# Patient Record
Sex: Male | Born: 1970 | Race: White | Hispanic: No | Marital: Married | Smoking: Never smoker
Health system: Southern US, Community
[De-identification: ages and names within clinical notes are randomized; demographics above are authoritative.]

## PROBLEM LIST (undated history)

## (undated) DIAGNOSIS — R0683 Snoring: Secondary | ICD-10-CM

## (undated) DIAGNOSIS — I517 Cardiomegaly: Secondary | ICD-10-CM

## (undated) DIAGNOSIS — T82198A Other mechanical complication of other cardiac electronic device, initial encounter: Secondary | ICD-10-CM

## (undated) DIAGNOSIS — I4819 Other persistent atrial fibrillation: Secondary | ICD-10-CM

## (undated) DIAGNOSIS — R011 Cardiac murmur, unspecified: Secondary | ICD-10-CM

## (undated) DIAGNOSIS — Z95810 Presence of automatic (implantable) cardiac defibrillator: Secondary | ICD-10-CM

## (undated) HISTORY — DX: Snoring: R06.83

## (undated) HISTORY — DX: Presence of automatic (implantable) cardiac defibrillator: Z95.810

## (undated) HISTORY — DX: Other persistent atrial fibrillation: I48.19

## (undated) HISTORY — DX: Cardiomegaly: I51.7

## (undated) HISTORY — PX: OTHER SURGICAL HISTORY: SHX169

## (undated) HISTORY — DX: Other mechanical complication of other cardiac electronic device, initial encounter: T82.198A

## (undated) HISTORY — DX: Cardiac murmur, unspecified: R01.1

---

## 2010-05-17 ENCOUNTER — Encounter: Payer: Self-pay | Admitting: Internal Medicine

## 2010-05-18 ENCOUNTER — Observation Stay (HOSPITAL_COMMUNITY): Admission: EM | Admit: 2010-05-18 | Discharge: 2010-05-19 | Payer: Self-pay | Admitting: Emergency Medicine

## 2010-05-18 ENCOUNTER — Ambulatory Visit: Payer: Self-pay | Admitting: Cardiology

## 2010-05-18 ENCOUNTER — Encounter (INDEPENDENT_AMBULATORY_CARE_PROVIDER_SITE_OTHER): Payer: Self-pay | Admitting: Internal Medicine

## 2010-05-19 ENCOUNTER — Encounter: Payer: Self-pay | Admitting: Cardiovascular Disease

## 2010-05-19 ENCOUNTER — Encounter (INDEPENDENT_AMBULATORY_CARE_PROVIDER_SITE_OTHER): Payer: Self-pay | Admitting: Internal Medicine

## 2010-05-19 DIAGNOSIS — R011 Cardiac murmur, unspecified: Secondary | ICD-10-CM | POA: Insufficient documentation

## 2010-05-19 DIAGNOSIS — D649 Anemia, unspecified: Secondary | ICD-10-CM

## 2010-05-20 DIAGNOSIS — E669 Obesity, unspecified: Secondary | ICD-10-CM | POA: Insufficient documentation

## 2010-05-20 DIAGNOSIS — I422 Other hypertrophic cardiomyopathy: Secondary | ICD-10-CM

## 2010-05-21 ENCOUNTER — Ambulatory Visit: Payer: Self-pay | Admitting: Cardiovascular Disease

## 2010-05-23 LAB — CONVERTED CEMR LAB
BUN: 16 mg/dL (ref 6–23)
Basophils Absolute: 0.1 10*3/uL (ref 0.0–0.1)
CO2: 28 meq/L (ref 19–32)
Eosinophils Absolute: 0.1 10*3/uL (ref 0.0–0.7)
Eosinophils Relative: 1.8 % (ref 0.0–5.0)
Hgb A1c MFr Bld: 4.8 % (ref 4.6–6.5)
Monocytes Absolute: 0.3 10*3/uL (ref 0.1–1.0)
Neutro Abs: 5.6 10*3/uL (ref 1.4–7.7)
Platelets: 448 10*3/uL — ABNORMAL HIGH (ref 150.0–400.0)
Potassium: 4.4 meq/L (ref 3.5–5.1)
Pro B Natriuretic peptide (BNP): 595 pg/mL — ABNORMAL HIGH (ref 0.0–100.0)
Sodium: 141 meq/L (ref 135–145)

## 2010-06-12 ENCOUNTER — Encounter: Payer: Self-pay | Admitting: Cardiovascular Disease

## 2010-07-16 ENCOUNTER — Ambulatory Visit: Payer: Self-pay | Admitting: Cardiovascular Disease

## 2010-07-16 ENCOUNTER — Ambulatory Visit: Payer: Self-pay | Admitting: Cardiology

## 2010-07-16 ENCOUNTER — Encounter (INDEPENDENT_AMBULATORY_CARE_PROVIDER_SITE_OTHER): Payer: Self-pay | Admitting: *Deleted

## 2010-07-22 ENCOUNTER — Ambulatory Visit: Payer: Self-pay | Admitting: Cardiovascular Disease

## 2010-07-22 ENCOUNTER — Ambulatory Visit (HOSPITAL_COMMUNITY): Admission: RE | Admit: 2010-07-22 | Discharge: 2010-07-22 | Payer: Self-pay | Admitting: Cardiovascular Disease

## 2010-07-25 ENCOUNTER — Ambulatory Visit: Payer: Self-pay | Admitting: Cardiovascular Disease

## 2010-07-25 ENCOUNTER — Ambulatory Visit (HOSPITAL_COMMUNITY): Admission: RE | Admit: 2010-07-25 | Discharge: 2010-07-25 | Payer: Self-pay | Admitting: Cardiovascular Disease

## 2010-07-31 ENCOUNTER — Ambulatory Visit: Payer: Self-pay | Admitting: Internal Medicine

## 2010-08-01 ENCOUNTER — Encounter: Payer: Self-pay | Admitting: Internal Medicine

## 2010-08-11 ENCOUNTER — Telehealth: Payer: Self-pay | Admitting: Cardiovascular Disease

## 2010-08-13 ENCOUNTER — Ambulatory Visit: Payer: Self-pay | Admitting: Internal Medicine

## 2010-08-19 LAB — CONVERTED CEMR LAB
BUN: 20 mg/dL (ref 6–23)
Basophils Absolute: 0 10*3/uL (ref 0.0–0.1)
Basophils Relative: 0.5 % (ref 0.0–3.0)
CO2: 25 meq/L (ref 19–32)
Creatinine, Ser: 1.4 mg/dL (ref 0.4–1.5)
Eosinophils Absolute: 0.2 10*3/uL (ref 0.0–0.7)
Eosinophils Relative: 2 % (ref 0.0–5.0)
GFR calc non Af Amer: 62.3 mL/min (ref >60)
HCT: 41 % (ref 39.0–52.0)
Hemoglobin: 14.2 g/dL (ref 13.0–17.0)
INR: 1.1 — ABNORMAL HIGH (ref 0.8–1.0)
Lymphocytes Relative: 31.1 % (ref 12.0–46.0)
Lymphs Abs: 2.4 10*3/uL (ref 0.7–4.0)
Monocytes Absolute: 0.4 10*3/uL (ref 0.1–1.0)
Potassium: 4.3 meq/L (ref 3.5–5.1)
WBC: 7.8 10*3/uL (ref 4.5–10.5)
aPTT: 30 s — ABNORMAL HIGH (ref 21.7–28.8)

## 2010-08-20 ENCOUNTER — Ambulatory Visit: Payer: Self-pay | Admitting: Internal Medicine

## 2010-08-20 ENCOUNTER — Ambulatory Visit (HOSPITAL_COMMUNITY): Admission: RE | Admit: 2010-08-20 | Discharge: 2010-08-21 | Payer: Self-pay | Admitting: Internal Medicine

## 2010-08-21 ENCOUNTER — Encounter: Payer: Self-pay | Admitting: Internal Medicine

## 2010-08-21 ENCOUNTER — Encounter: Payer: Self-pay | Admitting: Cardiovascular Disease

## 2010-08-27 ENCOUNTER — Encounter: Payer: Self-pay | Admitting: Internal Medicine

## 2010-09-03 ENCOUNTER — Encounter: Payer: Self-pay | Admitting: Internal Medicine

## 2010-09-03 ENCOUNTER — Ambulatory Visit: Payer: Self-pay

## 2010-09-19 ENCOUNTER — Telehealth: Payer: Self-pay | Admitting: Internal Medicine

## 2010-09-22 ENCOUNTER — Ambulatory Visit: Payer: Self-pay | Admitting: Internal Medicine

## 2010-09-22 ENCOUNTER — Ambulatory Visit: Payer: Self-pay

## 2010-09-23 ENCOUNTER — Telehealth: Payer: Self-pay | Admitting: Internal Medicine

## 2010-09-24 ENCOUNTER — Ambulatory Visit (HOSPITAL_COMMUNITY): Admission: RE | Admit: 2010-09-24 | Discharge: 2010-09-24 | Payer: Self-pay | Admitting: Internal Medicine

## 2010-09-24 ENCOUNTER — Ambulatory Visit: Payer: Self-pay | Admitting: Internal Medicine

## 2010-10-15 ENCOUNTER — Encounter (INDEPENDENT_AMBULATORY_CARE_PROVIDER_SITE_OTHER): Payer: Self-pay | Admitting: *Deleted

## 2010-11-11 ENCOUNTER — Telehealth: Payer: Self-pay | Admitting: Cardiovascular Disease

## 2010-12-09 ENCOUNTER — Ambulatory Visit
Admission: RE | Admit: 2010-12-09 | Discharge: 2010-12-09 | Payer: Self-pay | Source: Home / Self Care | Attending: Cardiovascular Disease | Admitting: Cardiovascular Disease

## 2010-12-09 DIAGNOSIS — I428 Other cardiomyopathies: Secondary | ICD-10-CM | POA: Insufficient documentation

## 2010-12-25 ENCOUNTER — Telehealth (INDEPENDENT_AMBULATORY_CARE_PROVIDER_SITE_OTHER): Payer: Self-pay | Admitting: *Deleted

## 2010-12-26 ENCOUNTER — Ambulatory Visit: Admission: RE | Admit: 2010-12-26 | Discharge: 2010-12-26 | Payer: Self-pay | Source: Home / Self Care

## 2010-12-26 ENCOUNTER — Ambulatory Visit (HOSPITAL_COMMUNITY)
Admission: RE | Admit: 2010-12-26 | Discharge: 2010-12-26 | Payer: Self-pay | Source: Home / Self Care | Attending: Cardiology | Admitting: Cardiology

## 2010-12-30 NOTE — Cardiovascular Report (Signed)
Summary: Pre-Op Orders  Pre-Op Orders   Imported By: Marylou Mccoy 08/20/2010 11:29:53  _____________________________________________________________________  External Attachment:    Type:   Image     Comment:   External Document

## 2010-12-30 NOTE — Assessment & Plan Note (Signed)
Summary: discuss sudden death and hocm per dr Eden Emms   CC:  discuss sudden cardiac death/  Pt feels fine other than a little anxious since appt moved up.Marland Kitchen  History of Present Illness: Charles Wyatt is seen at the request of Dr Eden Emms for risk stratification in the setting of Hypertrophic Obstructive Cardiomyopathy  Charles Wyatt is the son and grandson of known hypertrophic patients.  Charles Wyatt came to medical attention this summer when Charles Wyatt presented with progressive dyspnea on exertion and was found to have GI bleeding and anemia. Echocardiography demonstrated the evolution of hypertrophic heart disease not evident a decade or so ago with 20-23 mm walls.  Charles Wyatt was started on beta blockers and has had significant improvement in exercise intolerance which had been previously manifested as dyspnea and lightheadedness. Charles Wyatt has no history of syncope in the last 20 years.  Risk factors for sudden death include the death of his maternal uncle. Charles Wyatt has nonsustained ventricular tachycardia on Holter monitor. Charles Wyatt has not undergone exercise testing.    Current Medications (verified): 1)  Metoprolol Succinate 50 Mg Xr24h-Tab (Metoprolol Succinate) .... Take One Tablet By Mouth Daily 2)  Ferrous Sulfate 325 (65 Fe) Mg  Tabs (Ferrous Sulfate) .Marland Kitchen.. 1 Tab By Mouth Three Times A Day 3)  Dexilant 60 Mg Cpdr (Dexlansoprazole) .Marland Kitchen.. 1 Tab By Mouth Once Daily Until Charles Wyatt Runs Out  Allergies (verified): No Known Drug Allergies  Past History:  Past Medical History: Last updated: 05/19/2010 MURMUR Echo 05/18/10 ? HOCM ? TEE to R/O subaortic membrane Abnormal ECG: Voltage criteria LVH T wave inversion 1,AVL ANEMIA  Admitted 6/11 with Hct of 25 and bloody emesis Charles Wyatt also experienced several episodes of dark emesis which also resolved.  Family History: Last updated: 05/19/2010  family history of hypertensive obstructive cardiomyopathy  Social History: Last updated: 05/19/2010 Denies tobacco, alcohol or illicit drug use.  Charles Wyatt works   as a  Teacher, English as a foreign language.  Charles Wyatt lives with his girlfriend      Review of Systems       full review of systems was negative apart from a history of present illness and past medical history.   Vital Signs:  Patient profile:   40 year old male Height:      67 inches Weight:      240.4 pounds BMI:     37.79 Pulse rate:   84 / minute Pulse rhythm:   regular BP sitting:   142 / 86  (right arm) Cuff size:   regular  Vitals Entered By: Judithe Modest CMA (July 31, 2010 4:24 PM)  Physical Exam  General:  Affect appropriate  Healthy Well-nourished Caucasian male who:  appears stated age HEENT: normal, apart from ptosis of his right eye Neck supple with no adenopathy JVP normal no bruits no thyromegaly Lungs clear with no wheezing and good diaphragmatic motion Heart:  S1/S2 SEM that accentuates with valvsalva  t. 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 Charles Wyatt has at least  2 risk factors for sudden cardiac death and as such is an appropriate candidate for ICD implantation for primary prevention. With the obstruction, I would probably favor the use of a dual-chamber device to allow both up titration of beta blocker as well as potentially preexcitation of the septum to help decrease outflow obstruction. We have discussed potential benefits as well as potential risks including but not limited to death perforation infection.  Charles Wyatt understands these risks and is willing to proceed. Given his young age would anticipate implanting a Medtronic protecta device  The second issue is trying to identify the gene in the [place] [facility]. The patient's grandmother was a Building services engineer. She has at least 3 of 8 siblings who were likely she had typically positive with 2 phenotypically  identified and one identified in their offspring.    There has been some initiative in pursuing this via [facility] taking care of the grand niece of the patient  I have congtacted [facility] so as to coordinate efforts His updated medication list for this problem includes:    Metoprolol Succinate 50 Mg Xr24h-tab (Metoprolol succinate) .Marland Kitchen... Take one tablet by mouth daily

## 2010-12-30 NOTE — Procedures (Signed)
Summary: WOUND CHECK/MEDTRONIC   Current Medications (verified): 1)  Metoprolol Succinate 50 Mg Xr24h-Tab (Metoprolol Succinate) .... Take One Tablet By Mouth Daily 2)  Ferrous Sulfate 325 (65 Fe) Mg  Tabs (Ferrous Sulfate) .Marland Kitchen.. 1 Tab By Mouth Three Times A Day 3)  Dexilant 60 Mg Cpdr (Dexlansoprazole) .Marland Kitchen.. 1 Tab By Mouth Once Daily Until He Runs Out  Allergies (verified): No Known Drug Allergies   ICD Specifications Following MD:  Sherryl Manges, MD     ICD Vendor:  Medtronic     ICD Model Number:  314DRG     ICD Serial Number:  [id number] ICD DOI:  08/20/2010     ICD Implanting MD:  Sherryl Manges, MD  Lead 1:    Location: RA     DOI: 08/20/2010     Model #: 4098     Serial #: [id number]     Status: active Lead 2:    Location: RV     DOI: 08/20/2010     Model #: [id number]     Serial #: [id number]     Status: active  Indications::  HOCM   ICD Follow Up Battery Voltage:  3.21 V     Charge Time:  8.7 seconds     Underlying rhythm:  SR   ICD Device Measurements Atrium:  Amplitude: 6.3 mV, Impedance: 532 ohms, Threshold: 0.75 V at 0.40 msec Right Ventricle:  Amplitude: 14.8 mV, Impedance: 399 ohms, Threshold: 0.50 V at 0.40 msec Shock Impedance: 60/59 ohms   Episodes MS Episodes:  0     Shock:  0     ATP:  0     Nonsustained:  0     Atrial Therapies:  0 Atrial Pacing:  2%     Ventricular Pacing:  67.3%  Brady Parameters Mode DDD     Lower Rate Limit:  60     Upper Rate Limit 130 PAV 180     Sensed AV Delay:  150  Tachy Zones VF:  200     VT:  176-200     Tech Comments:  WOUND CHECK--STERI STRIPS REMOVED.  NO REDNESS OR SWELLING AT SITE.  NORMAL DEVICE FUNCTION.  NO EPISODES SINCE IMPLANT.  TURNED 1:1 SVT ON AND CHANGED MAX LEAD IMPEDANCE FOR LIA.  ROV 12-09-10 W/SK. PT AWARE OF INSTRUCTIONS ON ICD DISCHARGE. PT TO BE ENROLLED IN [facility] AND AWARE OF TRANSMITTER BEING SENT. Charles Wyatt  September 03, 2010 12:50 PM

## 2010-12-30 NOTE — Letter (Signed)
Summary: [facility]  [facility]   Imported By: Marylou Mccoy 07/02/2010 10:04:10  _____________________________________________________________________  External Attachment:    Type:   Image     Comment:   External Document

## 2010-12-30 NOTE — Assessment & Plan Note (Signed)
Summary: swelling l. arm  Nurse Visit   Impression & Recommendations: Pt comes in w/complaints of r. arm swelling from shoulder to fingers and intermittent increase of swelling above pacer site that he believes to be blood.  Arm is edemetous, non-pitting,  red, blanchable, denies pain. DOD request U/S of arm, subclavian and axillary to r/o DVT and eval blood flow. Pt did state that he did put his book bag over his r. shoulder last week in error and has also been doing a lot of over head work.  Krsitan interogated PPM.   Other Orders: Venous Duplex Upper Extremity (Venous Duplex Upp)   Allergies: No Known Drug Allergies  Orders Added: 1)  Venous Duplex Upper Extremity [Venous Duplex Upp]       Past History:  Past Medical History: Last updated: 05/19/2010 MURMUR Echo 05/18/10 ? HOCM ? TEE to R/O subaortic membrane Abnormal ECG: Voltage criteria LVH T wave inversion 1,AVL ANEMIA  Admitted 6/11 with Hct of 25 and bloody emesis He also experienced several episodes of dark emesis which also resolved.  Family History: Last updated: 05/19/2010  family history of hypertensive obstructive cardiomyopathy  Social History: Last updated: 05/19/2010 Denies tobacco, alcohol or illicit drug use.  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

## 2010-12-30 NOTE — Progress Notes (Addendum)
Summary: Swelling around pacemaker site  Phone Note Call from Patient Call back at Home Phone [phone]   Caller: Patient Summary of Call: Pt had paemaker placed on9/21/11 and pt right arm is swelling Initial call taken by: Judie Grieve,  September 19, 2010 11:38 AM  Follow-up for Phone Call        lmfcb Follow-up by: Claris Gladden RN,  September 19, 2010 1:52 PM  Additional Follow-up for Phone Call Additional follow up Details #1::        spoke w/ pt and he adv he has been working with hands overhead a lot and swollen from elbow to wrist and thinks pacemaker has moved. denies pain or feeling poorly. pacemaker pocket intact. DOD recommend pt rest arm to decrease swelling and to notify us if swelling does not decrese. pt expressed understanding.  Additional Follow-up by: Claris Gladden RN,  September 19, 2010 2:11 PM

## 2010-12-30 NOTE — Miscellaneous (Signed)
Summary: Device preload  Clinical Lists Changes  Observations: Added new observation of ICD INDICATN: HOCM (08/27/2010 7:25) Added new observation of ICDLEADSTAT2: active (08/27/2010 7:25) Added new observation of ICDLEADSER2: [id number] V (08/27/2010 7:25) Added new observation of ICDLEADMOD2: 6947  (08/27/2010 7:25) Added new observation of ICDLEADLOC2: RV  (08/27/2010 7:25) Added new observation of ICDLEADSTAT1: active  (08/27/2010 7:25) Added new observation of ICDLEADSER1: [id number]  (08/27/2010 7:25) Added new observation of ICDLEADMOD1: 5076  (08/27/2010 7:25) Added new observation of ICDLEADLOC1: RA  (08/27/2010 7:25) Added new observation of ICD IMP MD: Sherryl Manges, MD  (08/27/2010 7:25) Added new observation of ICDLEADDOI2: 08/20/2010  (08/27/2010 7:25) Added new observation of ICDLEADDOI1: 08/20/2010  (08/27/2010 7:25) Added new observation of ICD IMPL DTE: 08/20/2010  (08/27/2010 7:25) Added new observation of ICD SERL#: [id number] H  (08/27/2010 7:25) Added new observation of ICD MODL#: 314DRG  (08/27/2010 3:66) Added new observation of ICDMANUFACTR: Medtronic  (08/27/2010 7:25) Added new observation of ICD MD: Sherryl Manges, MD  (08/27/2010 7:25)       ICD Specifications Following MD:  Sherryl Manges, MD     ICD Vendor:  Medtronic     ICD Model Number:  314DRG     ICD Serial Number:  [id number] ICD DOI:  08/20/2010     ICD Implanting MD:  Sherryl Manges, MD  Lead 1:    Location: RA     DOI: 08/20/2010     Model #: 9563     Serial #: [id number]     Status: active Lead 2:    Location: RV     DOI: 08/20/2010     Model #: 1884     Serial #: [id number]     Status: active  Indications::  HOCM

## 2010-12-30 NOTE — Letter (Signed)
Summary: [facility]   [facility]   Imported By: Roderic Ovens 09/16/2010 14:39:01  _____________________________________________________________________  External Attachment:    Type:   Image     Comment:   External Document

## 2010-12-30 NOTE — Progress Notes (Signed)
Summary: pt request call  Phone Note Call from Patient Call back at Home Phone [phone]   Caller: Patient Reason for Call: Talk to Nurse Initial call taken by: Judie Grieve,  September 23, 2010 1:42 PM  Follow-up for Phone Call        spoke w/ pt and he understand that the u/s from yesterday was clear. he requests to speak w/Dr. Graciela Husbands as he has several questions. if the return call is after 5-6pm pls call him at [phone] Follow-up by: Claris Gladden RN,  September 23, 2010 2:08 PM  Additional Follow-up for Phone Call Additional follow up Details #1::        adv pt to be at short stay in am btwn 9-930 for Dr. Graciela Husbands to assess his arm. Pt to be there.  Additional Follow-up by: Claris Gladden RN,  September 23, 2010 5:58 PM

## 2010-12-30 NOTE — Letter (Signed)
Summary: Appointment - Cardiac MRI  [facility], Main Office  [address]   [place], [place] [address]   Phone: [phone]  Fax: [phone]      July 16, 2010 MRN: [id number]   ALESSIO BOGAN [address] [place], [place]  [address]   Dear Mr. Dahan,   We have scheduled the above patient for an appointment for a Cardiac MRI on 07-25-2010 at 1:00 p.m.  Please refer to the below information for the location and instructions for this test:  Location:     [facility]       [address]       [place], [place]  [address] Instructions:    Wilmon Arms at [facility] Outpatient Registration 45 minutes prior to your appointment time.  This will ensure you are in the Radiology Department 30 minutes prior to your appointment.    There are no restrictions for this test you may eat and take medications as usual.  If you need to reschedule this appointment please call at the number listed above.  Sincerely,       Lorne Skeens  [facility] Scheduling Team

## 2010-12-30 NOTE — Progress Notes (Signed)
Summary: Question about GXT  Phone Note Call from Patient Call back at Home Phone [phone]   Caller: Patient Summary of Call: Question about GXT Initial call taken by: Judie Grieve,  August 11, 2010 4:03 PM  Follow-up for Phone Call        discussed with dr Eden Emms, no need for GXT, appt canceled Deliah Goody, RN  August 11, 2010 4:31 PM

## 2010-12-30 NOTE — Cardiovascular Report (Signed)
Summary: Office Visit   Office Visit   Imported By: Roderic Ovens 10/02/2010 16:07:21  _____________________________________________________________________  External Attachment:    Type:   Image     Comment:   External Document

## 2010-12-30 NOTE — Procedures (Signed)
Summary: Cardiology Device Clinic   Current Medications (verified): 1)  Metoprolol Succinate 50 Mg Xr24h-Tab (Metoprolol Succinate) .... Take One Tablet By Mouth Daily 2)  Ferrous Sulfate 325 (65 Fe) Mg  Tabs (Ferrous Sulfate) .Marland Kitchen.. 1 Tab By Mouth Three Times A Day 3)  Dexilant 60 Mg Cpdr (Dexlansoprazole) .Marland Kitchen.. 1 Tab By Mouth Once Daily Until He Runs Out  Allergies (verified): No Known Drug Allergies   ICD Specifications Following MD:  Sherryl Manges, MD     ICD Vendor:  Medtronic     ICD Model Number:  314DRG     ICD Serial Number:  [id number] ICD DOI:  08/20/2010     ICD Implanting MD:  Sherryl Manges, MD  Lead 1:    Location: RA     DOI: 08/20/2010     Model #: 2376     Serial #: [id number]     Status: active Lead 2:    Location: RV     DOI: 08/20/2010     Model #: 0737     Serial #: [id number]     Status: active  Indications::  HOCM   ICD Follow Up Battery Voltage:  3.22 V     Charge Time:  8.7 seconds     Underlying rhythm:  SR   ICD Device Measurements Atrium:  Amplitude: 6.5 mV, Impedance: 532 ohms, Threshold: 0.50 V at 0.40 msec Right Ventricle:  Amplitude: 16.6 mV, Impedance: 399 ohms, Threshold: 0.50 V at 0.40 msec Shock Impedance: 59/56 ohms   Episodes MS Episodes:  0     Shock:  0     ATP:  0     Nonsustained:  0     Atrial Therapies:  0 Atrial Pacing:  3.6%     Ventricular Pacing:  50.7%  Brady Parameters Mode DDD     Lower Rate Limit:  60     Upper Rate Limit 130 PAV 180     Sensed AV Delay:  150  Tachy Zones VF:  200     VT:  176-200     Tech Comments:  PT CALLED IN FOR RIGHT ARM SWELLING.  SWELLING STARTED 09-17-10.  NORMAL DEVICE FUNCTION.  PT WAS CHECKED BY DR Johney Frame AND PT TO HAVE [name].  PT SCHEDULED AT 1200. [name]  September 22, 2010 9:03 AM

## 2010-12-30 NOTE — Assessment & Plan Note (Signed)
Summary: 8-10 week f/u/sl   CC:  check up.  History of Present Illness: Charles Wyatt is seen today at the request of Dr Abram Sander for HOCM.   Initial consult 05/20/10 The patient had an echo on 6/21 with spetal thickness of 20mm, resting gradient , valsalva gradient .  He has markedly positive family history for HOCM.  His mother has a septal thickness reportedly 40mm and sees [name] with an AICD.  She has a Event organiser.  He has an uncle who died suddenly in his 45's.  The patient has gained about 30 lbs over the last couple of years.  He was recently hospitalized with a GI bleed from erosive esophagitis with HCT done to 25.  His most recent HCT after transfusion was 29.6. He denies syncope, sscp, edema, or palpitations.  BB was started last visit. Exertional dyspnea is improved but still present especially climbing stairs.  No SSCP or presyncope.  Will titrate up Toprol to 50 mg as resting HR in 70's.  Needs further risk stratification for functional capacity and sudden death.  Will arrange TEE to better view subaortic anatomy and R/O web.  48 hr holter and MRI to stratify for VT and F/U Graciela Husbands.  ETT after med titration to R/O hypotensive response.  Prefer titrating single drug: BB and not use polypharmacy with verapamil and Norpace at this time  Current Problems (verified): 1)  Cardiomyopathy, Hypertrophic, Obstructive  (ICD-425.1) 2)  Obesity  (ICD-278.00) 3)  Murmur  (ICD-785.2) 4)  Anemia  (ICD-285.9)  Current Medications (verified): 1)  Protonix 40 Mg Tbec (Pantoprazole Sodium) .Marland Kitchen.. 1 Tab By Mouth Once Daily 2)  Carafate 1 Gm/48ml Susp (Sucralfate) .... 2 Teaspoons As Needed 3)  Metoprolol Succinate 50 Mg Xr24h-Tab (Metoprolol Succinate) .... Take One Tablet By Mouth Daily 4)  Ferrous Sulfate 325 (65 Fe) Mg  Tabs (Ferrous Sulfate) .Marland Kitchen.. 1 Tab By Mouth Three Times A Day 5)  Dexilant 60 Mg Cpdr (Dexlansoprazole) .Marland Kitchen.. 1 Tab By Mouth Once Daily Until He Runs Out  Allergies (verified): No  Known Drug Allergies  Past History:  Past Medical History: Last updated: 05/19/2010 MURMUR Echo 05/18/10 ? HOCM ? TEE to R/O subaortic membrane Abnormal ECG: Voltage criteria LVH T wave inversion 1,AVL ANEMIA  Admitted 6/11 with Hct of 25 and bloody emesis He also experienced several episodes of dark emesis which also resolved.  Family History: Last updated: 05/19/2010  family history of hypertensive obstructive cardiomyopathy  Social History: Last updated: 05/19/2010 Denies tobacco, alcohol or illicit drug use.  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   Vital Signs:  Patient profile:   40 year old male Height:      67 inches Weight:      203 pounds BMI:     31.91 Pulse rate:   70 / minute Resp:     12 per minute BP sitting:   129 / 82  (left arm)  Vitals Entered By: Kem Parkinson (July 16, 2010 8:21 AM)  Physical Exam  General:  Affect appropriate Healthy:  appears stated age HEENT: normal Neck supple with no adenopathy JVP normal no bruits no thyromegaly Lungs clear with no wheezing and good diaphragmatic motion Heart:  S1/S2 SEM  that accentuates with valvsalva and standing from squat. no ,rub, gallop or click PMI normal Abdomen: benighn, BS positve, no tenderness, no AAA no bruit.  No HSM or HJR Distal pulses intact with no bruits No edema Neuro non-focal Skin warm and dry    Impression & Recommendations:  Problem # 1:  CARDIOMYOPATHY, HYPERTROPHIC, OBSTRUCTIVE (ICD-425.1) TEE, MRI, 48 hr holter to be ordered.  Increase Toprol to 50mg .  Once BB titrated will  do ETT to R/O hypotensive response to exertion and F/U with [name] to get his input on risk of sudden death once  all the data is in His updated medication list for this problem includes:    Metoprolol Succinate 50 Mg Xr24h-tab (Metoprolol succinate) .Marland Kitchen... Take one tablet by mouth daily  Orders: Trans Esophageal Echocardiogram (TEE) Holter (Holter) EP Referral (Cardiology EP Ref ) Treadmill (Treadmill) Cardiac MRI (Cardiac MRI)  Problem # 2:  OBESITY (ICD-278.00) Following [employer].  has lost 13 lbs.  Congratulated him on this.    Patient Instructions: 1)  Your physician has recommended you make the following change in your medication: INCREASE METOPROLOL SUCC 50MG  ONCE DAILY 2)  Your physician has recommended that you wear a holter monitor.  Holter monitors are medical devices that record the heart's electrical activity. Doctors most often use these monitors to diagnose arrhythmias. Arrhythmias are problems with the speed or rhythm of the heartbeat. The monitor is a small, portable device. You can wear one while you do your normal daily activities. This is usually used to diagnose what is causing palpitations/syncope (passing out).48 HOUR MONITOR 3)  Your physician has requested that you have an exercise tolerance test.  For further information please visit [email or web address]  Please also follow instruction sheet, as given.SCHEDULE IN 4 WEEKS 4)  Your physician has requested that you have a cardiac MRI.  Cardiac MRI uses a computer to create images of your heart as it's beating, producing both still and moving pictures of your heart and major blood vessels. For further information please visit  [email or web address]  Please follow the instruction sheet given to you today for more information. 5)  REFERRAL TO [name] TO DISCUSS SUDDEN DEATH AND HOCM Prescriptions: METOPROLOL SUCCINATE 50 MG XR24H-TAB (METOPROLOL SUCCINATE) Take one tablet by mouth daily  #30 x 12   Entered by:   Deliah Goody, RN   Authorized by:   Colon Branch, MD, [facility]   Signed by:   Deliah Goody, RN on 07/16/2010    Method used:   Electronically to        [facility]  [address] [phone]* (retail)       [address]       [place], [place]  [id number]       Ph: [phone] or [phone]       Fax: [phone]   RxID:   [id number]

## 2010-12-30 NOTE — Assessment & Plan Note (Signed)
Summary: NP6/echo done 6/19 / cardiomyopathy/ gd   CC:  sob and pt was recently in hospital for anemia.  History of Present Illness: Charles Wyatt is seen today at the request of Dr Abram Sander for HOCM.  The patient had an echo on 6/21 with spetal thickness of 20mm, resting gradient , valsalva gradient .  He has markedly positive family history for HOCM.  His mother has a septal thickness reportedly 40mm and sees [name] with an AICD.  She has a Event organiser.  He has an uncle who died suddenly in his 57's.  The patient has gained about 30 lbs over the last couple of years.  He is more sedentary and has more exertional dyspnea.  He was recently hospitalized with a GI bleed from erosive esophagitis with HCT done to 25.  His most recent HCT after transfusion was 29.6. He denies syncope, sscp, edema, or palpitations.  He is not on BB, calcium blockers or norpace.  I had a long discussion with Misty about the diagnosis of HOCM.  We discussed LVOT gradients, MR, diastolic dysfunciton as it relates to dyspnea and sudden death.  Given his family history of arrythmia I think he needs further testing.    His echo could not R/O subaortic web.  He will need a TEE when his anemia is better and his esophagitis is healed.  We discussed initiation of beta blocker Rx.  Subsequently he will need a 48 hr holter for NSVT, ETT to R/O hypotension with exercise, consdier MRI to assess for degree of hyperenhancement.  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 1 Tab By Mouth Once Daily 2)  Carafate 1 Gm/109ml Susp (Sucralfate) .... 2 Teaspoons 4 Times Daily 3)  Toprol Xl 25 Mg Xr24h-Tab (Metoprolol Succinate) .Marland Kitchen.. 1  Once Daily  Allergies (verified): No Known Drug Allergies  Past History:  Past Medical History: Last updated: 05/19/2010 MURMUR Echo 05/18/10 ? HOCM ? TEE to R/O subaortic membrane Abnormal ECG: Voltage criteria LVH T wave inversion 1,AVL ANEMIA  Admitted 6/11 with Hct of 25 and bloody emesis He also experienced several episodes of dark emesis which also resolved.  Family History: Last updated: 05/19/2010  family history of hypertensive obstructive cardiomyopathy  Social History: Last updated: 05/19/2010 Denies tobacco, alcohol or illicit drug use.  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   Vital Signs:  Patient profile:   40 year old male Height:      67 inches Weight:      219 pounds BMI:     34.42 Pulse rate:   88 / minute Resp:     14 per minute BP sitting:   145 / 89  (left arm)  Vitals Entered By: Kem Parkinson (May 21, 2010 3:07 PM)  Physical Exam  General:  Affect appropriate Healthy:  appears stated age HEENT: normal Neck supple with no adenopathy JVP normal no bruits no thyromegaly Lungs clear with no  wheezing and good diaphragmatic motion Heart:  S1/S2 HOCM murmur accentuates wit hstanding from squat and valsalva no ,rub, gallop or click PMI normal Abdomen: benighn, BS positve, no tenderness, no AAA no bruit.  No HSM or HJR Distal pulses intact with no bruits No edema Neuro non-focal Ptosis right eye Skin warm and dry    Impression & Recommendations:  Problem # 1:  CARDIOMYOPATHY, HYPERTROPHIC, OBSTRUCTIVE (ICD-425.1) Start BB.  TEE  in 6-8 weeks when anemia stable and esophagus has had time to heal  Will then further titrate meds and risk stratify for sudden  death. His updated medication list for this problem includes:    Toprol Xl 25 Mg Xr24h-tab (Metoprolol succinate) .Marland Kitchen... 1 once daily  Orders: TLB-BMP (Basic Metabolic Panel-BMET) (80048-METABOL) TLB-BNP (B-Natriuretic Peptide) (83880-BNPR)  Problem # 2:  OBESITY (ICD-278.00) Discussed low carb diet which is an issue.  Previously very active but discouraged comptetive sports like his previous hockey playing due to HOCM Orders: TLB-A1C / Hgb A1C (Glycohemoglobin) (83036-A1C)  Problem # 3:  ANEMIA (ICD-285.9) F/U DR Lore[address] Poorly tolerated due to HOCM>  Contnue carafate.  ? gastrin levels checked Orders: TLB-CBC Platelet - w/Differential (85025-CBCD)  Patient Instructions: 1)  Your physician recommends that you schedule a follow-up appointment in: 8 -10 WEEKS WITH DR Eden Emms 2)  Your physician recommends that you return for lab work YQ:MVHQI CBC BMET BNP HGB A1C 3)  Your physician recommends that you continue on your current medications as directed. Please refer to the Current Medication list given to you today.TOPROL 25 MG 1 once daily Prescriptions: TOPROL XL 25 MG XR24H-TAB (METOPROLOL SUCCINATE) 1 once daily  #30 x 11   Entered by:   Scherrie Bateman, LPN   Authorized by:   Colon Branch, MD, [facility]   Signed by:   Scherrie Bateman, LPN on 69/62/9528   Method used:   [facility]  [address] [phone][facility] (retail[facility]       [address]       [place], [place]  [address]       Ph: [phone] or [phone]       Fax: [phone]   RxID:   [id number]  Script is accurate Colon Branch, MD, [facility]  May 22, 2010 5:21 PM   EKG Report  Procedure date:  05/21/2010  Findings:      NSR LVH

## 2010-12-30 NOTE — Letter (Signed)
Summary: TEE Instructions  [facility], Main Office  [address]   [place], [place] [address]   Phone: [phone]  Fax: [phone]      TEE Instructions  07/16/2010 MRN: [id number]  MAKENZIE VITTORIO [address] [place], [place]  [address]      You are scheduled for a TEE on TUESDAY 07-22-10 with Dr. Eden Emms.  Please arrive at the [facility] at ____9:30____ a.m. on the day of your procedure.  1)   Diet:     A)   Nothing to eat or drink after midnight except your medications with        a sip of water.    B)   May have clear liquid breakfast.  Clear liquids include:  water, broth,        Sprite, Ginger Ale, black cofee, tea (no sugar), cranberry / grape /        apple juice, jello (not red), popsicle from clear juices (not red).  2)  Must have a responsible person to drive you home.  3)   Bring your current insurance cards and current list of all your medications.   *Special Note:  Every effort is made to have your procedure done on time.  Occasionally there are emergencies that present themselves at the hospital that may cause delays.  Please be patient if a delay does occur.  *If you have any questions after you get home, please call the office at [phone].

## 2010-12-30 NOTE — Letter (Signed)
Summary: Appointment - Reschedule  [facility], Main Office  [address]   [place], [place] [address]   Phone: [phone]  Fax: [phone]     October 15, 2010 MRN: [id number]   Charles Wyatt [address] [place], [place]  [address]   Dear [name],   Due to a change in our office schedule, your appointment on  12-09-2010                     at  9:10am   must be changed.  It is very important that we reach you to reschedule this appointment. We look forward to participating in your health care needs. Please contact us at the number listed above at your earliest convenience to reschedule this appointment.     Sincerely,      Lorne Skeens  [facility] Scheduling Team

## 2010-12-30 NOTE — Miscellaneous (Signed)
  Clinical Lists Changes  Observations: Added new observation of MRI CHEST: Findings:  There was severe asymetric septal thickening at 2.2cm.   There was SAM with turbulence in the LVOT and MR.  There was severe   LAE.  Right sided cardiac chambers with normal.  The AV and root   were normal.  Right sided chambers were normal.  EF was 68%.  There   was significant hyperenhancement in the basal anterior wall, septum   and inferoapical areas.    Impression:       1)    Morphology consistant with hypertrophic cardiomyopathy,         septal thickness 2.2 cm, with SAM, LVOT turbulence and MR    2)    Severe LAE puts patient at risk for afib which he would   likely tolerate poorly   3)    EF 68%   4)    Significant hyperehancement involving septum butr also basal   anterior wall and inferoapex   Given septal thickness, positive family history of sudden death and   hyperenhancement on MRI patient has high risk features for sudden   death.    Will refer to [name] for review and consideration of AICD.   Interestingly he cares    For the patients mother who also had HOCM with an AICD  (07/25/2010 10:54)      MRI Chest  Procedure date:  07/25/2010  Findings:      Findings:  There was severe asymetric septal thickening at 2.2cm.   There was SAM with turbulence in the LVOT and MR.  There was severe   LAE.  Right sided cardiac chambers with normal.  The AV and root   were normal.  Right sided chambers were normal.  EF was 68%.  There   was significant hyperenhancement in the basal anterior wall, septum   and inferoapical areas.    Impression:       1)    Morphology consistant with hypertrophic cardiomyopathy,         septal thickness 2.2 cm, with SAM, LVOT turbulence and MR    2)    Severe LAE puts patient at risk for afib which he would   likely tolerate poorly   3)    EF 68%   4)    Significant hyperehancement involving septum butr also basal   anterior wall and  inferoapex   Given septal thickness, positive family history of sudden death and   hyperenhancement on MRI patient has high risk features for sudden   death.    Will refer to [name] for review and consideration of AICD.   Interestingly he cares    For the patients mother who also had HOCM with an AICD

## 2010-12-30 NOTE — Cardiovascular Report (Signed)
Summary: Office Visit   Office Visit   Imported By: Roderic Ovens 09/29/2010 13:12:13  _____________________________________________________________________  External Attachment:    Type:   Image     Comment:   External Document

## 2010-12-30 NOTE — Letter (Signed)
Summary: Implantable Device Instructions  [facility], Main Office  [address]   [place], [place] [address]   Phone: [phone]  Fax: [phone]      Implantable Device Instructions  You are scheduled for:  _____ Permanent Transvenous Pacemaker ___x__ Implantable Cardioverter Defibrillator  DUAL CHAMBER _____ Implantable Loop Recorder _____ Generator Change  on _9/21/11___ with Dr. __KLEIN___.  1.  Please arrive at the Short Stay Center at [facility] at _6:30____ on the day of your procedure.  2.  Do not eat or drink the night before your procedure.  3.  Complete lab work on _9/15/11____.  The lab at [facility] is open from 8:30 AM to 1:30 PM and from 2:30 PM to 5:00 PM.  The lab at [facility] is open from 7:30 AM to 5:30 PM.  You do not have to be fasting.    4.  Plan for an overnight stay.  Bring your insurance cards and a list of your medications.  5.  Wash your chest and neck with antibacterial soap (any brand) the evening before and the morning of your procedure.  Rinse well.  6.  Education material received:     Pacemaker _____           ICD _X____           Arrhythmia _____  *If you have ANY questions after you get home, please call the office [phone].  *Every attempt is made to prevent procedures from being rescheduled.  Due to the nauture of Electrophysiology, rescheduling can happen.  The physician is always aware and directs the staff when this occurs.

## 2011-01-01 NOTE — Progress Notes (Signed)
Summary: Stress Echo Pre-Procedure  Phone Note Outgoing Call Call back at [facility] Phone [phone]   Call placed by: Antionette Char RN,  December 25, 2010 3:41 PM Call placed to: Patient Reason for Call: Confirm/change Appt Summary of Call: Stress Echo instructions given to patient.

## 2011-01-01 NOTE — Assessment & Plan Note (Addendum)
Summary: rov/pt wants to discuss things with dr Shylo Zamor/dm   CC:  pt complians of sob with exerction...pt has been feeling better for a couple weeks now.  History of Present Illness: Jhovany is seen today for F/U of HOCM.  He had his AICD placed. He had marked family history of sudden death.  Septal thickness of 21mm and lots of hyperenhancement on cardiac MRI.  He initially felt more energetic and less dyspnic with institution of beta blockers.  Since his AICD he feels that he has taken a step backwards with more fatigue and dyspnea especially going up stairs.  Iniitial concern post op for RUE sweilling and venous clot but both [name] and venogrphy showed no thrombus.  Continues to feel that RUE more swollen.  Denies dizzyness or syncope.  No SSCP.  Pacer thresholds and post op CXR reviewed and within normal range.  No pneumothorax.    Long discussion with Meng regarding various facets of HOCM including diatolic dysfunction, outflow tract gradient , MR and sudden death.  Will have him do ETT with resting and stress gradients to see if he is on enough beta blocker and consider adding disopyramide and or calcium blocker.  If symptoms worsen wojuld refer to [facility] to consider alcohol septal ablation depending on anotomy vs myectomy and mitral valve repair.    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 Take One Tablet By Mouth Daily 2)  Tylenol 325 Mg Tabs (Acetaminophen) .... As Needed  Allergies (verified): No Known Drug Allergies  Past History:  Past Medical History: Last updated: 05/19/2010 MURMUR Echo 05/18/10 ? HOCM ? TEE to R/O subaortic membrane Abnormal ECG: Voltage criteria LVH T wave inversion 1,AVL ANEMIA   Admitted 6/11 with Hct of 25 and bloody emesis He also experienced several episodes of dark emesis which also resolved.  Family History: Last updated: 05/19/2010  family history of hypertensive obstructive cardiomyopathy  Social History: Last updated: 05/19/2010 Denies tobacco, alcohol or illicit drug use.  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   Vital Signs:  Patient profile:   40 year old male Height:      67 inches Weight:      214 pounds BMI:     33.64 Pulse rate:   80 / minute Resp:     14 per minute BP sitting:   120 / 80  (right arm)  Vitals Entered By: Kem Parkinson (December 09, 2010 3:46 PM)  Physical Exam  General:  Affect appropriate Healthy:  appears stated age HEENT: normal Neck supple with no adenopathy JVP normal no bruits no thyromegaly Lungs clear with no wheezing and good diaphragmatic motion Heart:  S1/S2  LVOT murmur that accentuates with standing no ub, gallop or click PMI normal Abdomen: benighn, BS positve, no tenderness, no AAA no bruit.  No HSM or HJR Distal pulses intact with no bruits No edema Neuro non-focal Skin warm and dry AICD under right clavicle healed well    ICD Specifications Following MD:  Sherryl Manges, MD     ICD Vendor:  Medtronic     ICD Model Number:  314DRG     ICD Serial Number:  [id number] ICD DOI:  08/20/2010     ICD Implanting MD:  Sherryl Manges, MD  Lead 1:    Location: RA     DOI: 08/20/2010     Model #: [id number]     Serial #: [id number]     Status: active Lead 2:    Location: RV     DOI: 08/20/2010     Model #: 6213     Serial #: [id number]     Status: active  Indications::  HOCM   Brady Parameters Mode DDD     Lower Rate Limit:   60     Upper Rate Limit 130 PAV 180     Sensed AV Delay:  150  Tachy Zones VF:  200     VT:  176-200     Impression & Recommendations:  Problem # 1:  CARDIOMYOPATHY, IDIOPATHIC HYPERTROPHIC (ICD-425.4) Contniue beta blocker.  ETT with gradients  Consdier adding verapamil and or disopyramide His updated medication list for this problem includes:    Metoprolol Succinate 50 Mg Xr24h-tab (Metoprolol succinate) .Marland Kitchen... Take one tablet by mouth daily  Orders: Stress Echo (Stress Echo)  Problem # 2:  AUTOMATIC IMPLANTABLE CARDIAC DEFIBRILLATOR SITU (ICD-V45.02) Well healed No objective evidence of venous clot or swelling.  Has had both [name] and venography with good subclavian flow F/U [name]  Patient Instructions: 1)  Your physician wants you to follow-up in: 3 months  You will receive a reminder letter in the mail two months in advance. If you don't receive a letter, please call our office to schedule the follow-up appointment. 2)  Your physician has requested that you have a stress echocardiogram. For further information please visit [email or web address]  Please follow instruction sheet as given.  Appended Document: rov/pt wants to discuss things with dr Jabari Swoveland/dm Gradient increases with exercise and HR at rest and with exercise too high.  See if he can tolerate Toprol 50 mg bid  Appended Document: rov/pt wants to discuss things with dr Ila Landowski/dm pt aware of stress test results and med change Deliah Goody, RN  January 02, 2011 5:20 PM    Clinical Lists Changes  Medications: Changed medication from METOPROLOL SUCCINATE 50 MG XR24H-TAB (METOPROLOL SUCCINATE) Take one tablet by mouth daily to METOPROLOL SUCCINATE 50 MG XR24H-TAB (METOPROLOL SUCCINATE) Take one tablet by mouth two times a day - Signed Rx of METOPROLOL SUCCINATE 50 MG XR24H-TAB (METOPROLOL SUCCINATE) Take one tablet by mouth two times a day;  #60 x 12;  Signed;  Entered by: Deliah Goody, RN;  Authorized by: Colon Branch, MD, [facility];  Method used[facility]  [address] [phone]*, [address], [place], [place], [place]  [id number], Ph: [phone] or [phone], Fax: [phone]    Prescriptions: METOPROLOL SUCCINATE 50 MG XR24H-TAB (METOPROLOL SUCCINATE) Take one tablet by mouth two times a day  #60 x 12   Entered by:   Deliah Goody, RN   Authorized by:   Colon Branch, MD, [facility]   Signed by:   Deliah Goody, RN on 01/02/2011   Method used:   [facility]        [facility]  [address] [phone]* (retail)       [address]       [place]  [place], [place]  [address]       Ph: [phone] or [phone]       Fax: [phone]   RxID:   [id number]

## 2011-01-01 NOTE — Progress Notes (Signed)
Summary: pt request call  Phone Note Call from Patient Call back at Home Phone [phone]   Caller: Patient Reason for Call: Talk to Nurse Initial call taken by: Judie Grieve,  November 11, 2010 2:56 PM  Follow-up for Phone Call        spoke with pt, he just has not been feeling well and wants to discuss things with dr Eden Emms again. follow up appt made Deliah Goody, RN  November 11, 2010 5:30 PM

## 2011-02-02 DIAGNOSIS — Z95810 Presence of automatic (implantable) cardiac defibrillator: Secondary | ICD-10-CM | POA: Insufficient documentation

## 2011-02-03 ENCOUNTER — Encounter (INDEPENDENT_AMBULATORY_CARE_PROVIDER_SITE_OTHER): Payer: BC Managed Care – PPO | Admitting: Internal Medicine

## 2011-02-03 ENCOUNTER — Encounter: Payer: Self-pay | Admitting: Internal Medicine

## 2011-02-03 DIAGNOSIS — Z95810 Presence of automatic (implantable) cardiac defibrillator: Secondary | ICD-10-CM

## 2011-02-03 DIAGNOSIS — I5032 Chronic diastolic (congestive) heart failure: Secondary | ICD-10-CM

## 2011-02-03 DIAGNOSIS — I428 Other cardiomyopathies: Secondary | ICD-10-CM

## 2011-02-10 NOTE — Assessment & Plan Note (Signed)
Summary: PC2.Marland KitchenMarland KitchenPER RHONDA RSC FROM BUMPLIST /LG   Visit Type:  Follow-up   History of Present Illness: Mr. Charles Wyatt is seen in followup for an ICD implanted for comes in the setting of a family history of sudden death and significant septal thickness and hyperenhancing on cardiac MRI. He also has a family history He continues to be significantly short of breath. This has not changed since his device is implanted.  Current Medications (verified): 1)  Metoprolol Succinate 50 Mg Xr24h-Tab (Metoprolol Succinate) .... Take One Tablet By Mouth Two Times A Day  Allergies (verified): No Known Drug Allergies  Past History:  Past Medical History: Last updated: 02/02/2011 MURMUR Echo 05/18/10 ? HOCM ? TEE to R/O subaortic membrane Abnormal ECG: Voltage criteria LVH T wave inversion 1,AVL ANEMIA  Admitted 6/11 with Hct of 25 and bloody emesis He also experienced several episodes of dark emesis which also resolved Medtronic dual chamber defibrillator-primary prevention SCD-Medtronic Protecta 314DRG  Vital Signs:  Patient profile:   40 year old male Height:      67 inches Weight:      218 pounds BMI:     34.27 Pulse rate:   77 / minute BP sitting:   136 / 90  (left arm)  Vitals Entered By: Laurance Flatten CMA (February 03, 2011 2:38 PM)  Physical Exam  General:  The patient was alert and oriented in no acute distress. HEENT Normal.  Neck veins were flat, carotids were brisk.  Lungs were clear.  Heart sounds were regular with 2/6 systolic murmur that goes to 4/6 with Valsalva Device pocket well-healed Abdomen was soft with active bowel sounds. There is no clubbing cyanosis or edema. Skin Warm and dry'    ICD Specifications Following MD:  Sherryl Manges, MD     ICD Vendor:  Medtronic     ICD Model Number:  314DRG     ICD Serial Number:  [id number] ICD DOI:  08/20/2010     ICD Implanting MD:  Sherryl Manges, MD  Lead 1:    Location: RA     DOI: 08/20/2010     Model #: 4098     Serial #:  [id number]     Status: active Lead 2:    Location: RV     DOI: 08/20/2010     Model #: 6213     Serial #: [id number]     Status: active  Indications::  HOCM   Brady Parameters Mode DDD     Lower Rate Limit:  60     Upper Rate Limit 130 PAV 180     Sensed AV Delay:  150  Tachy Zones VF:  200     VT:  176-200     Impression & Recommendations:  Problem # 1:  AUTOMATIC IMPLANTABLE CARDIAC DEFIBRILLATOR SITU (ICD-V45.02) Device parameters and data were reviewed and no changes were made  ]  Problem # 2:  CARDIOMYOPATHY, HYPERTROPHIC, OBSTRUCTIVE (ICD-425.1) the patient continues to have symptomatically hypertrophic cardiomyopathy notwithstanding his beta blockers. I wonder whether it be appropriate to consider disopyramide. I will defer this to Dr. Eden Emms  His updated medication list for this problem includes:    Metoprolol Succinate 50 Mg Xr24h-tab (Metoprolol succinate) .Marland Kitchen... Take one tablet by mouth two times a day  Patient Instructions: 1)  Your physician recommends that you continue on your current medications as directed. Please refer to the Current Medication list given to you today. 2)  Your physician wants you to follow-up in: year with  dr Logan Bores will receive a reminder letter in the mail two months in advance. If you don't receive a letter, please call our office to schedule the follow-up appointment. 3)  Your physician recommends that you schedule a follow-up appointment in: 3 months with care link

## 2011-02-13 LAB — BASIC METABOLIC PANEL
CO2: 25 mEq/L (ref 19–32)
Calcium: 9.2 mg/dL (ref 8.4–10.5)
Glucose, Bld: 112 mg/dL — ABNORMAL HIGH (ref 70–99)
Potassium: 3.8 mEq/L (ref 3.5–5.1)

## 2011-02-15 LAB — IRON AND TIBC
Iron: 30 ug/dL — ABNORMAL LOW (ref 42–135)
Saturation Ratios: 9 % — ABNORMAL LOW (ref 20–55)
UIBC: 296 ug/dL

## 2011-02-15 LAB — POCT I-STAT, CHEM 8
BUN: 14 mg/dL (ref 6–23)
Creatinine, Ser: 1.6 mg/dL — ABNORMAL HIGH (ref 0.4–1.5)
Glucose, Bld: 96 mg/dL (ref 70–99)
HCT: 26 % — ABNORMAL LOW (ref 39.0–52.0)
Potassium: 3.4 mEq/L — ABNORMAL LOW (ref 3.5–5.1)
Sodium: 144 mEq/L (ref 135–145)

## 2011-02-15 LAB — CBC
HCT: 22.3 % — ABNORMAL LOW (ref 39.0–52.0)
HCT: 27.4 % — ABNORMAL LOW (ref 39.0–52.0)
Hemoglobin: 7.7 g/dL — ABNORMAL LOW (ref 13.0–17.0)
Hemoglobin: 8.6 g/dL — ABNORMAL LOW (ref 13.0–17.0)
Hemoglobin: 9.4 g/dL — ABNORMAL LOW (ref 13.0–17.0)
MCHC: 34.2 g/dL (ref 30.0–36.0)
MCV: 96.3 fL (ref 78.0–100.0)
MCV: 97.3 fL (ref 78.0–100.0)
RBC: 2.61 MIL/uL — ABNORMAL LOW (ref 4.22–5.81)
RBC: 2.88 MIL/uL — ABNORMAL LOW (ref 4.22–5.81)
RDW: 14.9 % (ref 11.5–15.5)
WBC: 6.3 10*3/uL (ref 4.0–10.5)
WBC: 7.8 10*3/uL (ref 4.0–10.5)

## 2011-02-15 LAB — URINALYSIS, ROUTINE W REFLEX MICROSCOPIC
Glucose, UA: NEGATIVE mg/dL
Ketones, ur: NEGATIVE mg/dL
Nitrite: NEGATIVE
Protein, ur: NEGATIVE mg/dL
Specific Gravity, Urine: 1.03 (ref 1.005–1.030)

## 2011-02-15 LAB — BASIC METABOLIC PANEL
CO2: 27 mEq/L (ref 19–32)
Calcium: 8.2 mg/dL — ABNORMAL LOW (ref 8.4–10.5)
Chloride: 109 mEq/L (ref 96–112)
Chloride: 109 mEq/L (ref 96–112)
GFR calc Af Amer: >60 mL/min (ref >60)
GFR calc non Af Amer: 55 mL/min — ABNORMAL LOW (ref >60)
Glucose, Bld: 100 mg/dL — ABNORMAL HIGH (ref 70–99)
Potassium: 3.3 mEq/L — ABNORMAL LOW (ref 3.5–5.1)
Potassium: 3.4 mEq/L — ABNORMAL LOW (ref 3.5–5.1)
Sodium: 140 mEq/L (ref 135–145)

## 2011-02-15 LAB — TYPE AND SCREEN: ABO/RH(D): O POS

## 2011-02-15 LAB — COMPREHENSIVE METABOLIC PANEL
Alkaline Phosphatase: 56 U/L (ref 39–117)
BUN: 10 mg/dL (ref 6–23)
CO2: 26 mEq/L (ref 19–32)
Chloride: 110 mEq/L (ref 96–112)
Creatinine, Ser: 1.33 mg/dL (ref 0.4–1.5)
GFR calc non Af Amer: >60 mL/min (ref >60)
Glucose, Bld: 102 mg/dL — ABNORMAL HIGH (ref 70–99)
Potassium: 3 mEq/L — ABNORMAL LOW (ref 3.5–5.1)
Total Bilirubin: 1 mg/dL (ref 0.3–1.2)

## 2011-02-15 LAB — VITAMIN B12: Vitamin B-12: 387 pg/mL (ref 211–911)

## 2011-02-15 LAB — DIFFERENTIAL
Basophils Absolute: 0 10*3/uL (ref 0.0–0.1)
Eosinophils Relative: 1 % (ref 0–5)
Lymphocytes Relative: 30 % (ref 12–46)
Lymphs Abs: 2.3 10*3/uL (ref 0.7–4.0)
Monocytes Absolute: 0.4 10*3/uL (ref 0.1–1.0)
Neutro Abs: 5 10*3/uL (ref 1.7–7.7)

## 2011-02-15 LAB — HEMOGLOBIN AND HEMATOCRIT, BLOOD
HCT: 23.6 % — ABNORMAL LOW (ref 39.0–52.0)
HCT: 26.8 % — ABNORMAL LOW (ref 39.0–52.0)
Hemoglobin: 8.1 g/dL — ABNORMAL LOW (ref 13.0–17.0)

## 2011-02-15 LAB — HEPATIC FUNCTION PANEL
AST: 22 U/L (ref 0–37)
Alkaline Phosphatase: 61 U/L (ref 39–117)
Total Bilirubin: 0.4 mg/dL (ref 0.3–1.2)

## 2011-02-17 NOTE — Cardiovascular Report (Signed)
Summary: Office Visit   Office Visit   Imported By: Roderic Ovens 02/09/2011 15:05:07  _____________________________________________________________________  External Attachment:    Type:   Image     Comment:   External Document

## 2011-03-20 ENCOUNTER — Ambulatory Visit: Payer: BC Managed Care – PPO | Admitting: Cardiovascular Disease

## 2011-03-25 ENCOUNTER — Encounter: Payer: Self-pay | Admitting: Cardiovascular Disease

## 2011-03-25 NOTE — Op Note (Signed)
  Charles Wyatt, Charles Wyatt             ACCOUNT NO.:  [id number]  MEDICAL RECORD NO.:  [id number]           PATIENT TYPE:  LOCATION:                                 FACILITY:  PHYSICIAN:  [facility] Salvia, MD, FACCDATE OF BIRTH:  08/05/1971  DATE OF PROCEDURE:  01/21/2011 DATE OF DISCHARGE:                              OPERATIVE REPORT   PREOPERATIVE DIAGNOSIS:  Swollen left arm.  POSTOPERATIVE DIAGNOSIS:  Patent but stenotic left subclavian vein.  The patient was admitted for contrast venogram.  The patient was given contrast via his left arm.  The contrast demonstrated patency of the left subclavian vein, although there was narrowing.  The patient tolerated the procedure without apparent complication.     [facility] Salvia, MD, [facility]     SCK/MEDQ  D:  01/21/2011  T:  01/21/2011  Job:  [id number]  Electronically Signed by Sherryl Manges MD [facility] on 03/25/2011 08:46:48 AM

## 2011-03-26 ENCOUNTER — Ambulatory Visit (INDEPENDENT_AMBULATORY_CARE_PROVIDER_SITE_OTHER): Payer: BC Managed Care – PPO | Admitting: Cardiovascular Disease

## 2011-03-26 ENCOUNTER — Encounter: Payer: Self-pay | Admitting: Cardiovascular Disease

## 2011-03-26 DIAGNOSIS — I421 Obstructive hypertrophic cardiomyopathy: Secondary | ICD-10-CM

## 2011-03-26 DIAGNOSIS — E669 Obesity, unspecified: Secondary | ICD-10-CM

## 2011-03-26 DIAGNOSIS — Z95810 Presence of automatic (implantable) cardiac defibrillator: Secondary | ICD-10-CM

## 2011-03-26 MED ORDER — VERAPAMIL HCL ER 180 MG PO TBCR: 180.0000 mg | EXTENDED_RELEASE_TABLET | Freq: Every day | ORAL | Status: DC

## 2011-03-26 NOTE — Assessment & Plan Note (Signed)
Contributing to fatigue and dyspnea.  Discussed low carb diet.  Exercise limited by HOCM

## 2011-03-26 NOTE — Patient Instructions (Addendum)
Your physician recommends that you schedule a follow-up appointment in: 3 months with Dr. Eden Emms  Your physician has recommended you make the following change in your medication: Start Verapamil 180mg  1 tablet daily

## 2011-03-26 NOTE — Assessment & Plan Note (Signed)
Add verapamil.  F/U echo with exercise gradients in 3 months.

## 2011-03-26 NOTE — Assessment & Plan Note (Signed)
No D/C.  F/U [name].  Implanted for marked hyperenhancement on MRI, family history of sudden death and septal thickness over 2cm

## 2011-03-26 NOTE — Progress Notes (Signed)
Charles Wyatt is seen today for F/U of HOCM.  He had his AICD placed. He had marked family history of sudden death.  Septal thickness of 21mm and lots of hyperenhancement on cardiac MRI.  He initially felt more energetic and less dyspnic with institution of beta blockers.  Since his AICD he feels that he has taken a step backwards with more fatigue and dyspnea especially going up stairs.  Iniitial concern post op for RUE sweilling and venous clot but both [name] and venogrphy showed no thrombus.  Continues to feel that RUE more swollen.  Denies dizzyness or syncope.  No SSCP.  Pacer thresholds and post op CXR reviewed and within normal range.  No pneumothorax.    Long discussion with Charles Wyatt regarding various facets of HOCM including diatolic dysfunction, outflow tract gradient , MR and sudden death.   His mother had surgery at the [facility].  Charles Wyatt thinks he needs to make some lifstyle changes and lose weight to help his symptoms.  Despite having limitations from dyspnea and fatigue he does not want to entertain surgery.  Allergies, weight gain and likely reflux are also contributing to a possible asthmatic component.  He does not want PFT;s at this point.  We will try to add verapamil and see how he does although I expressed to him my suspicion that medical Rx would not be very effective for him given his septal thickness, enhancement with MRI and increase in gradient with exercise despite beta blockade.    ROS: Denies fever, malais, weight loss, blurry vision, decreased visual acuity, cough, sputum, SOB, hemoptysis, pleuritic pain, palpitaitons, heartburn, abdominal pain, melena, lower extremity edema, claudication, or rash.   General: Affect appropriate Healthy:  appears stated age HEENT: normal Neck supple with no adenopathy JVP normal no bruits no thyromegaly Lungs clear with no wheezing and good diaphragmatic motion Heart:  S1/S2 HOCM murmur accentuates with standing no ,rub, gallop or click PMI  normal Abdomen: benighn, BS positve, no tenderness, no AAA no bruit.  No HSM or HJR Distal pulses intact with no bruits No edema Neuro non-focal Skin warm and dry No muscular weakness AICD under right clavicle   Current Outpatient Prescriptions  Medication Sig Dispense Refill  . metoprolol (LOPRESSOR) 50 MG tablet Take 50 mg by mouth 2 (two) times daily.        . verapamil (CALAN-SR) 180 MG CR tablet Take 1 tablet (180 mg total) by mouth daily.  30 tablet  11    Allergies  Review of patient's allergies indicates no known allergies.  Electrocardiogram:  Assessment and Plan]

## 2011-05-07 ENCOUNTER — Ambulatory Visit (INDEPENDENT_AMBULATORY_CARE_PROVIDER_SITE_OTHER): Payer: BC Managed Care – PPO | Admitting: *Deleted

## 2011-05-07 DIAGNOSIS — Z95810 Presence of automatic (implantable) cardiac defibrillator: Secondary | ICD-10-CM

## 2011-05-07 DIAGNOSIS — I509 Heart failure, unspecified: Secondary | ICD-10-CM

## 2011-05-07 DIAGNOSIS — I428 Other cardiomyopathies: Secondary | ICD-10-CM

## 2011-05-14 NOTE — Progress Notes (Signed)
icd remote w/icm  

## 2011-05-15 ENCOUNTER — Encounter: Payer: Self-pay | Admitting: *Deleted

## 2011-06-08 ENCOUNTER — Ambulatory Visit (INDEPENDENT_AMBULATORY_CARE_PROVIDER_SITE_OTHER): Payer: BC Managed Care – PPO | Admitting: Cardiovascular Disease

## 2011-06-08 ENCOUNTER — Encounter: Payer: Self-pay | Admitting: Cardiovascular Disease

## 2011-06-08 DIAGNOSIS — Z95810 Presence of automatic (implantable) cardiac defibrillator: Secondary | ICD-10-CM

## 2011-06-08 DIAGNOSIS — E669 Obesity, unspecified: Secondary | ICD-10-CM

## 2011-06-08 DIAGNOSIS — I421 Obstructive hypertrophic cardiomyopathy: Secondary | ICD-10-CM

## 2011-06-08 NOTE — Assessment & Plan Note (Signed)
Normal functioning with no D/C.  F/U clinic 3 months.

## 2011-06-08 NOTE — Assessment & Plan Note (Signed)
Weight up 8-10 lbs.  Discussed low carb diet.  Girlfriend has wheat gluten allergy and avoiding high starch carbs should not be hard.  Also discussed exercise program.  He travels a lot for work and makes any "routine" difficult

## 2011-06-08 NOTE — Progress Notes (Signed)
Charles Wyatt is seen today for F/U of HOCM. He had his AICD placed. He had marked family history of sudden death. Septal thickness of 21mm and lots of hyperenhancement on cardiac MRI. He initially felt more energetic and less dyspnic with institution of beta blockers. Since his AICD he feels that he has taken a step backwards with more fatigue and dyspnea especially going up stairs. Iniitial concern post op for RUE sweilling and venous clot but both [name] and venogrphy showed no thrombus. Continues to feel that RUE more swollen. Denies dizzyness or syncope. No SSCP. Pacer thresholds and post op CXR reviewed and within normal range. No pneumothorax.  Long discussion with Domnique regarding various facets of HOCM including diatolic dysfunction, outflow tract gradient , MR and sudden death. His mother had surgery at the [facility] clinic. Colbey thinks he needs to make some lifstyle changes and lose weight to help his symptoms. Despite having limitations from dyspnea and fatigue he does not want to entertain surgery. Allergies, weight gain and likely reflux are also contributing to a possible asthmatic component. He does not want PFT;s at this point. We will try to add verapamil and see how he does although I expressed to him my suspicion that medical Rx would not be very effective for him given his septal thickness, enhancement with MRI and increase in gradient with exercise despite beta blockade.   ROS: Denies fever, malais, weight loss, blurry vision, decreased visual acuity, cough, sputum, SOB, hemoptysis, pleuritic pain, palpitaitons, heartburn, abdominal pain, melena, lower extremity edema, claudication, or rash.  All other systems reviewed and negative  General: Affect appropriate Healthy:  appears stated age HEENT: normal Neck supple with no adenopathy JVP normal no bruits no thyromegaly Lungs clear with no wheezing and good diaphragmatic motion Heart:  S1/S2 HOCM  Murmur no ,rub, gallop or click PMI  normal Abdomen: benighn, BS positve, no tenderness, no AAA no bruit.  No HSM or HJR Distal pulses intact with no bruits No edema Neuro non-focal Skin warm and dry AICD under left clavicle No muscular weakness   Current Outpatient Prescriptions  Medication Sig Dispense Refill  . metoprolol (LOPRESSOR) 50 MG tablet Take 50 mg by mouth 2 (two) times daily.        . verapamil (CALAN-SR) 180 MG CR tablet Take 1 tablet (180 mg total) by mouth daily.  30 tablet  11    Allergies  Review of patient's allergies indicates no known allergies.  Electrocardiogram:    Assessment and Plan

## 2011-06-08 NOTE — Assessment & Plan Note (Signed)
Stopped verapamil last week.  Didn't feel any different.  However he describes being very active with no presyncope and his murmur is much less impressive today especially with valsalva and standing from a squat.  Will resume and check gradient by echo in 8 weeks.  Don't think anything will help is diastolic function with septum over 2.0 cm

## 2011-06-08 NOTE — Patient Instructions (Addendum)
Restart Verapamil   Your physician has requested that you have an echocardiogram. Echocardiography is a painless test that uses sound waves to create images of your heart. It provides your doctor with information about the size and shape of your heart and how well your heart's chambers and valves are working. This procedure takes approximately one hour. There are no restrictions for this procedure.  NEEDS IN 8 WEEKS  Your physician wants you to follow-up in: 6 months. You will receive a reminder letter in the mail two months in advance. If you don't receive a letter, please call our office to schedule the follow-up appointment.

## 2011-07-20 ENCOUNTER — Encounter: Payer: Self-pay | Admitting: *Deleted

## 2011-08-04 ENCOUNTER — Other Ambulatory Visit (HOSPITAL_COMMUNITY): Payer: BC Managed Care – PPO | Admitting: Radiology

## 2011-08-07 ENCOUNTER — Ambulatory Visit (HOSPITAL_COMMUNITY): Payer: BC Managed Care – PPO | Attending: Cardiovascular Disease | Admitting: Radiology

## 2011-08-07 ENCOUNTER — Other Ambulatory Visit (HOSPITAL_COMMUNITY): Payer: BC Managed Care – PPO | Admitting: Radiology

## 2011-08-07 DIAGNOSIS — I421 Obstructive hypertrophic cardiomyopathy: Secondary | ICD-10-CM | POA: Insufficient documentation

## 2011-08-07 DIAGNOSIS — I079 Rheumatic tricuspid valve disease, unspecified: Secondary | ICD-10-CM | POA: Insufficient documentation

## 2011-08-07 DIAGNOSIS — I379 Nonrheumatic pulmonary valve disorder, unspecified: Secondary | ICD-10-CM | POA: Insufficient documentation

## 2011-08-07 DIAGNOSIS — I059 Rheumatic mitral valve disease, unspecified: Secondary | ICD-10-CM | POA: Insufficient documentation

## 2011-08-07 DIAGNOSIS — E669 Obesity, unspecified: Secondary | ICD-10-CM | POA: Insufficient documentation

## 2011-08-10 ENCOUNTER — Telehealth: Payer: Self-pay | Admitting: Cardiovascular Disease

## 2011-08-10 NOTE — Telephone Encounter (Signed)
Pt calling returning call from Friday. Please call back.

## 2011-08-10 NOTE — Telephone Encounter (Signed)
Left message for pt of echo results Charles Wyatt

## 2011-08-13 ENCOUNTER — Other Ambulatory Visit: Payer: Self-pay | Admitting: Internal Medicine

## 2011-08-13 ENCOUNTER — Encounter: Payer: Self-pay | Admitting: Internal Medicine

## 2011-08-13 ENCOUNTER — Ambulatory Visit (INDEPENDENT_AMBULATORY_CARE_PROVIDER_SITE_OTHER): Payer: BC Managed Care – PPO | Admitting: *Deleted

## 2011-08-13 DIAGNOSIS — I428 Other cardiomyopathies: Secondary | ICD-10-CM

## 2011-08-13 DIAGNOSIS — I509 Heart failure, unspecified: Secondary | ICD-10-CM

## 2011-08-16 LAB — REMOTE ICD DEVICE
AL IMPEDENCE ICD: 475 Ohm
ATRIAL PACING ICD: 1.83 pct
BAMS-0001: 170 {beats}/min
FVT: 0
RV LEAD THRESHOLD: 0.625 V
TOT-0006: 20110921000000
TZAT-0001ATACH: 1
TZAT-0002ATACH: NEGATIVE
TZAT-0002ATACH: NEGATIVE
TZAT-0005SLOWVT: 91 pct
TZAT-0011SLOWVT: 10 ms
TZAT-0011SLOWVT: 10 ms
TZAT-0012ATACH: 150 ms
TZAT-0012SLOWVT: 200 ms
TZAT-0012SLOWVT: 200 ms
TZAT-0018ATACH: NEGATIVE
TZAT-0018SLOWVT: NEGATIVE
TZAT-0019ATACH: 6 V
TZAT-0019ATACH: 6 V
TZAT-0019SLOWVT: 8 V
TZAT-0019SLOWVT: 8 V
TZAT-0020ATACH: 1.5 ms
TZAT-0020ATACH: 1.5 ms
TZAT-0020FASTVT: 1.5 ms
TZON-0003ATACH: 350 ms
TZON-0003SLOWVT: 340 ms
TZON-0005SLOWVT: 12
TZST-0001ATACH: 4
TZST-0001ATACH: 5
TZST-0001FASTVT: 2
TZST-0001FASTVT: 3
TZST-0001SLOWVT: 4
TZST-0001SLOWVT: 6
TZST-0002ATACH: NEGATIVE
TZST-0002FASTVT: NEGATIVE
TZST-0002FASTVT: NEGATIVE
TZST-0003SLOWVT: 25 J
TZST-0003SLOWVT: 35 J
VENTRICULAR PACING ICD: 0.26 pct
VF: 0

## 2011-08-27 ENCOUNTER — Encounter: Payer: Self-pay | Admitting: *Deleted

## 2011-08-28 NOTE — Progress Notes (Signed)
ICD/ICM checked by remote. 

## 2011-11-12 ENCOUNTER — Encounter: Payer: Self-pay | Admitting: Internal Medicine

## 2011-11-12 ENCOUNTER — Ambulatory Visit (INDEPENDENT_AMBULATORY_CARE_PROVIDER_SITE_OTHER): Payer: BC Managed Care – PPO | Admitting: *Deleted

## 2011-11-12 ENCOUNTER — Other Ambulatory Visit: Payer: Self-pay | Admitting: Internal Medicine

## 2011-11-12 DIAGNOSIS — I428 Other cardiomyopathies: Secondary | ICD-10-CM

## 2011-11-12 DIAGNOSIS — I509 Heart failure, unspecified: Secondary | ICD-10-CM

## 2011-11-15 LAB — REMOTE ICD DEVICE
AL IMPEDENCE ICD: 418 Ohm
AL THRESHOLD: 0.75 V
ATRIAL PACING ICD: 3.27 pct
BATTERY VOLTAGE: 3.1798 V
CHARGE TIME: 8.988 s
DEV-0020ICD: NEGATIVE
PACEART VT: 0
RV LEAD THRESHOLD: 0.625 V
TOT-0006: 20110921000000
TZAT-0001ATACH: 2
TZAT-0001ATACH: 3
TZAT-0002ATACH: NEGATIVE
TZAT-0004SLOWVT: 8
TZAT-0005SLOWVT: 88 pct
TZAT-0011SLOWVT: 10 ms
TZAT-0012ATACH: 150 ms
TZAT-0012ATACH: 150 ms
TZAT-0012SLOWVT: 200 ms
TZAT-0013SLOWVT: 1
TZAT-0013SLOWVT: 2
TZAT-0018ATACH: NEGATIVE
TZAT-0018ATACH: NEGATIVE
TZAT-0018SLOWVT: NEGATIVE
TZAT-0019ATACH: 6 V
TZAT-0019FASTVT: 8 V
TZAT-0019SLOWVT: 8 V
TZAT-0019SLOWVT: 8 V
TZAT-0020ATACH: 1.5 ms
TZAT-0020ATACH: 1.5 ms
TZAT-0020FASTVT: 1.5 ms
TZAT-0020SLOWVT: 1.5 ms
TZON-0003ATACH: 350 ms
TZON-0003SLOWVT: 340 ms
TZON-0003VSLOWVT: 450 ms
TZON-0004SLOWVT: 16
TZON-0004VSLOWVT: 20
TZON-0005SLOWVT: 12
TZST-0001ATACH: 6
TZST-0001FASTVT: 2
TZST-0001FASTVT: 3
TZST-0001SLOWVT: 3
TZST-0001SLOWVT: 5
TZST-0002ATACH: NEGATIVE
TZST-0002ATACH: NEGATIVE
TZST-0002FASTVT: NEGATIVE
TZST-0002FASTVT: NEGATIVE
TZST-0002FASTVT: NEGATIVE
TZST-0003SLOWVT: 25 J
TZST-0003SLOWVT: 35 J
VENTRICULAR PACING ICD: 0.48 pct
VF: 0

## 2011-11-19 NOTE — Progress Notes (Signed)
Remote icd check w/icm  

## 2011-12-04 ENCOUNTER — Encounter: Payer: Self-pay | Admitting: *Deleted

## 2012-02-05 ENCOUNTER — Telehealth: Payer: Self-pay | Admitting: Internal Medicine

## 2012-02-05 NOTE — Telephone Encounter (Signed)
02-05-12 lmm @ 420pm pt to call to set up pacer ck with klein in march/mt

## 2012-05-05 ENCOUNTER — Telehealth: Payer: Self-pay | Admitting: Internal Medicine

## 2012-05-05 NOTE — Telephone Encounter (Signed)
05-05-12 lmm @ 424pm pt to set up pacer ck with klein or brooke/mt

## 2012-05-26 ENCOUNTER — Telehealth: Payer: Self-pay | Admitting: Internal Medicine

## 2012-05-26 NOTE — Telephone Encounter (Signed)
05-26-12 lmm @ 422pm for pt to call to set up defib ck, brooke would be fine/mt

## 2012-10-17 ENCOUNTER — Telehealth: Payer: Self-pay | Admitting: Internal Medicine

## 2012-10-17 NOTE — Telephone Encounter (Addendum)
10-17-12 LMM @ 445P FOR PT TO CALL TO SET UP PAST DUE PACER CK WITH KLEIN/MT 11-28-12 lmm @ 429pm for pt to call to set up past due pacer ck with klein/mt

## 2012-11-29 ENCOUNTER — Encounter: Payer: Self-pay | Admitting: *Deleted

## 2013-03-22 ENCOUNTER — Telehealth: Payer: Self-pay | Admitting: Internal Medicine

## 2013-03-22 ENCOUNTER — Encounter: Payer: Self-pay | Admitting: Internal Medicine

## 2013-03-22 NOTE — Telephone Encounter (Signed)
03-22-13 no repsonse from previous calls, will send certified letter/mt

## 2013-03-28 ENCOUNTER — Encounter: Payer: Self-pay | Admitting: Cardiology

## 2013-03-28 ENCOUNTER — Ambulatory Visit (INDEPENDENT_AMBULATORY_CARE_PROVIDER_SITE_OTHER): Payer: BC Managed Care – PPO | Admitting: Cardiology

## 2013-03-28 ENCOUNTER — Encounter: Payer: Self-pay | Admitting: Internal Medicine

## 2013-03-28 DIAGNOSIS — I4891 Unspecified atrial fibrillation: Secondary | ICD-10-CM

## 2013-03-28 DIAGNOSIS — Z95810 Presence of automatic (implantable) cardiac defibrillator: Secondary | ICD-10-CM

## 2013-03-28 DIAGNOSIS — I428 Other cardiomyopathies: Secondary | ICD-10-CM

## 2013-03-28 DIAGNOSIS — T82198A Other mechanical complication of other cardiac electronic device, initial encounter: Secondary | ICD-10-CM

## 2013-03-28 DIAGNOSIS — I421 Obstructive hypertrophic cardiomyopathy: Secondary | ICD-10-CM

## 2013-03-28 LAB — ICD DEVICE OBSERVATION
AL THRESHOLD: 0.5 V
BAMS-0001: 170 {beats}/min
BATTERY VOLTAGE: 3.07 V
PACEART VT: 0
RV LEAD AMPLITUDE: 11.4 mv
TZAT-0001ATACH: 1
TZAT-0001ATACH: 2
TZAT-0001FASTVT: 1
TZAT-0001SLOWVT: 2
TZAT-0002ATACH: NEGATIVE
TZAT-0002ATACH: NEGATIVE
TZAT-0002FASTVT: NEGATIVE
TZAT-0004SLOWVT: 8
TZAT-0004SLOWVT: 8
TZAT-0005SLOWVT: 88 pct
TZAT-0005SLOWVT: 91 pct
TZAT-0011SLOWVT: 10 ms
TZAT-0011SLOWVT: 10 ms
TZAT-0012ATACH: 150 ms
TZAT-0012ATACH: 150 ms
TZAT-0012SLOWVT: 200 ms
TZAT-0012SLOWVT: 200 ms
TZAT-0013SLOWVT: 1
TZAT-0013SLOWVT: 2
TZAT-0018ATACH: NEGATIVE
TZAT-0018ATACH: NEGATIVE
TZAT-0018FASTVT: NEGATIVE
TZAT-0018SLOWVT: NEGATIVE
TZAT-0018SLOWVT: NEGATIVE
TZAT-0019ATACH: 6 V
TZAT-0019FASTVT: 8 V
TZON-0003ATACH: 350 ms
TZON-0003SLOWVT: 340 ms
TZON-0004SLOWVT: 16
TZON-0004VSLOWVT: 20
TZON-0005SLOWVT: 12
TZST-0001ATACH: 4
TZST-0001FASTVT: 2
TZST-0001FASTVT: 3
TZST-0001FASTVT: 6
TZST-0001SLOWVT: 3
TZST-0001SLOWVT: 4
TZST-0001SLOWVT: 6
TZST-0002ATACH: NEGATIVE
TZST-0002FASTVT: NEGATIVE
TZST-0002FASTVT: NEGATIVE
TZST-0003SLOWVT: 35 J
VENTRICULAR PACING ICD: 0.2 pct
VF: 1

## 2013-03-28 MED ORDER — METOPROLOL TARTRATE 50 MG PO TABS: 50.0000 mg | ORAL_TABLET | Freq: Two times a day (BID) | ORAL | Status: DC

## 2013-03-28 MED ORDER — VERAPAMIL HCL ER 180 MG PO TBCR: 180.0000 mg | EXTENDED_RELEASE_TABLET | Freq: Every day | ORAL | Status: AC

## 2013-03-28 NOTE — Progress Notes (Signed)
ELECTROPHYSIOLOGY OFFICE NOTE  Patient ID: Charles Wyatt MRN: [id number], DOB/AGE: 41/13/1972   Date of Visit: 03/28/2013  Primary Physician: None Primary Cardiologist / EP: Eden Emms, MD / Graciela Husbands, MD Reason for Visit: ICD shock  History of Present Illness  Charles Wyatt is a pleasant 42 year old man with HOCM s/p dual chamber ICD implantation for primary prevention SCD who presents today for ICD shock. He has been lost to follow-up with Dr. Graciela Husbands since March 2012. He reports he "just stopped getting reminders." In addition, he has not taken any medications in over one year. He was taking metoprolol and verapamil. He denies any side effects or trouble affording his medications. He states, "I just forgot them and never started back."   On Saturday while walking to his car he received an ICD shock without warning or prodrome. He states he was feeling like his usual self and had just finished eating lunch at a restaurant. He denies CP, SOB, palpitations, dizziness or syncope. He has never received a shock before. Today, he denies chest pain or shortness of breath. He denies palpitations, dizziness, near syncope or syncope. He denies LE swelling, orthopnea, PND or recent weight gain.   Past Medical History Past Medical History  Diagnosis Date  . Murmur     06 19/2011 ?[facility] ?Tee to R/o subaortic membrane   . Abnormal ECG     Volrtage criteria LVH T wave inversion 1, AVL  . Anemia     ad mitted 6/11 with HCTZ of 25 and bloody emesis    Past Surgical History Past Surgical History  Procedure Laterality Date  . Pacemaker insertion      Medtronic Protecta 314 DRG    Allergies/Intolerances No Known Allergies  Current Home Medications Current Outpatient Prescriptions  Medication Sig Dispense Refill  . metoprolol (LOPRESSOR) 50 MG tablet Take 1 tablet (50 mg total) by mouth 2 (two) times daily.  60 tablet  6  . verapamil (CALAN-SR) 180 MG CR tablet Take 1 tablet (180 mg total) by mouth  daily.  30 tablet  6   No current facility-administered medications for this visit.   Social History Social History  . Marital Status: Single   Social History Main Topics  . Smoking status: Never Smoker   . Smokeless tobacco: No  . Alcohol Use: No  . Drug Use: No   Social History Narrative   Denies tobacco , acohol or illicit drug use . He works as a Investment banker, corporate. He lives with his girlfriend    Review of Systems General: No chills, fever, night sweats or weight changes Cardiovascular: No chest pain, dyspnea on exertion, edema, orthopnea, palpitations, paroxysmal nocturnal dyspnea Dermatological: No rash, lesions or masses Respiratory: No cough, dyspnea Urologic: No hematuria, dysuria Abdominal: No nausea, vomiting, diarrhea, bright red blood per rectum, melena, or hematemesis Neurologic: No visual changes, weakness, changes in mental status All other systems reviewed and are otherwise negative except as noted above.  Physical Exam Blood pressure 131/78, pulse 93, height 5\' 6"  (1.676 m), weight 218 lb (98.884 kg), SpO2 98.00%.  General: Well developed, well appearing 42 year old male in no acute distress. HEENT: Normocephalic, atraumatic. EOMs intact. Sclera nonicteric. Oropharynx clear.  Neck: Supple. No JVD. Lungs: Respirations regular and unlabored, CTA bilaterally. No wheezes, rales or rhonchi. Heart: RRR. S1, S2 present. No murmurs, rub, S3 or S4. Abdomen: Soft, non-distended. Extremities: No clubbing, cyanosis or edema. PT/Radials 2+ and equal bilaterally. Psych: Normal affect. Neuro: Alert and  oriented X 3. Moves all extremities spontaneously.   Diagnostics Device interrogation today - Normal device function with good battery status and stable thresholds, sensing and impedance trends; 1 VF episode with subsequent 35J shock x1, EGM reveals rapid AF at 222 bpm; multiple NST and SVT episodes, EGMs reviewed and shows rapid AF; longest AF episode 73  minutes; no evidence of ventricular arrhythmias; histograms appropriate; OptiVol reviewed and stable, no evidence of volume overload; discussed with GT - reprogrammed tachy therapies - VT zone off, VF zone to 207 bpm with extended detection intervals (30/40) and ATP during charging; no other programming changes made; see PaceArt report  Assessment and Plan 1. ICD shock 2. Atrial fibrillation, paroxysmal, newly diagnosed 3. HOCM 4. Medical noncompliance  [name] presents with an inappropriate ICD shock. His device interrogation shows rapid AF in the VF zone which resulted in a single shock. In addition, there are multiple other episodes documented as NST or SVT episodes that appear to also be rapid AF. His longest episode has been 73 minutes. [name] denies any symptoms as outlined above. He does not appear volume overloaded and OptiVol is stable. The diagnosis of PAF is new for him. He has been noncompliant with medications and follow-up. He was counseled and strongly advised to keep his appointments and to take his medications. He will resume metoprolol and verapamil. His ICD tachy therapies were reprogrammed today. He has never received a shock prior to this and when I interrogated all episodes on his device the only therapy delivered prior to today was shock at implant for DFT testing. He has otherwise never received shocks or ATP. Therefore, his ICD was programmed as a single zone for now - VF zone at 207 bpm, intervals extended to 30/40 with ATP during charging. Given documented AF, he may require chronic anticoagulation. However, I will defer this decision to Dr. Graciela Husbands and scheduled follow-up to reestablish EP care with him in 2-3 weeks. He will resume routine remote device checks every 3 months. Mr. Knueppel expressed verbal understanding and agrees to be compliant.   I discussed/formulated this plan of care with Dr. Lewayne Bunting Signed, Jantzen Pilger, PA-C 03/28/2013, 3:22 PM

## 2013-03-28 NOTE — Patient Instructions (Addendum)
Your physician recommends that you schedule a follow-up appointment in: 2 weeks with [name]  Your physician has recommended you make the following change in your medication: RESTART your medications  Remote monitoring is used to monitor your Pacemaker of ICD from home. This monitoring reduces the number of office visits required to check your device to one time per year. It allows [name] to keep an eye on the functioning of your device to ensure it is working properly. You are scheduled for a device check from home on 06/26/13. You may send your transmission at any time that day. If you have a wireless device, the transmission will be sent automatically. After your physician reviews your transmission, you will receive a postcard with your next transmission date.

## 2013-04-11 ENCOUNTER — Ambulatory Visit (INDEPENDENT_AMBULATORY_CARE_PROVIDER_SITE_OTHER): Payer: BC Managed Care – PPO | Admitting: Internal Medicine

## 2013-04-11 ENCOUNTER — Encounter: Payer: Self-pay | Admitting: Internal Medicine

## 2013-04-11 VITALS — BP 142/90 | HR 73 | Ht 67.0 in | Wt 220.0 lb

## 2013-04-11 DIAGNOSIS — I422 Other hypertrophic cardiomyopathy: Secondary | ICD-10-CM

## 2013-04-11 DIAGNOSIS — I421 Obstructive hypertrophic cardiomyopathy: Secondary | ICD-10-CM

## 2013-04-11 DIAGNOSIS — I428 Other cardiomyopathies: Secondary | ICD-10-CM

## 2013-04-11 LAB — ICD DEVICE OBSERVATION
AL IMPEDENCE ICD: 418 Ohm
AL THRESHOLD: 0.5 V
BAMS-0001: 170 {beats}/min
DEV-0020ICD: NEGATIVE
RV LEAD AMPLITUDE: 11.6 mv
TZAT-0001ATACH: 1
TZAT-0001ATACH: 2
TZAT-0001ATACH: 3
TZAT-0001SLOWVT: 1
TZAT-0001SLOWVT: 2
TZAT-0002ATACH: NEGATIVE
TZAT-0002ATACH: NEGATIVE
TZAT-0002FASTVT: NEGATIVE
TZAT-0004SLOWVT: 8
TZAT-0004SLOWVT: 8
TZAT-0005SLOWVT: 88 pct
TZAT-0005SLOWVT: 91 pct
TZAT-0012ATACH: 150 ms
TZAT-0013SLOWVT: 2
TZAT-0018ATACH: NEGATIVE
TZAT-0018ATACH: NEGATIVE
TZAT-0018ATACH: NEGATIVE
TZAT-0018FASTVT: NEGATIVE
TZAT-0019ATACH: 6 V
TZAT-0019FASTVT: 8 V
TZAT-0020ATACH: 1.5 ms
TZON-0003ATACH: 350 ms
TZON-0003VSLOWVT: 450 ms
TZON-0004SLOWVT: 16
TZON-0004VSLOWVT: 20
TZST-0001ATACH: 6
TZST-0001FASTVT: 6
TZST-0001SLOWVT: 3
TZST-0001SLOWVT: 5
TZST-0001SLOWVT: 6
TZST-0002ATACH: NEGATIVE
TZST-0002ATACH: NEGATIVE
TZST-0002FASTVT: NEGATIVE
TZST-0002FASTVT: NEGATIVE
TZST-0002FASTVT: NEGATIVE
TZST-0003SLOWVT: 25 J
TZST-0003SLOWVT: 35 J
VENTRICULAR PACING ICD: 2.6 pct

## 2013-04-11 NOTE — Patient Instructions (Signed)
Your physician has recommended you make the following change in your medication:  ** please check on the cost of the following blood thinners with your pharmacy, call and let us know which you would prefer to use 1) Eliquis 5 mg one tablet by mouth twice daily, or 2) Xarelto 20 mg one tablet by mouth once daily.  Your physician recommends that you schedule a follow-up appointment : with Dr. Eden Emms (next available).  Remote monitoring is used to monitor your Pacemaker of ICD from home. This monitoring reduces the number of office visits required to check your device to one time per year. It allows [name] to keep an eye on the functioning of your device to ensure it is working properly. You are scheduled for a device check from home on 07/17/13. You may send your transmission at any time that day. If you have a wireless device, the transmission will be sent automatically. After your physician reviews your transmission, you will receive a postcard with your next transmission date.  Your physician wants you to follow-up in: 6 months You will receive a reminder letter in the mail two months in advance. If you don't receive a letter, please call our office to schedule the follow-up appointment.

## 2013-04-11 NOTE — Progress Notes (Signed)
Patient has no care team.   HPI  Charles Wyatt is a 42 y.o. male seen in followup for an ICD shock 4/14 for Afib with RVR   The device originally implanted for HCM in the setting of a family history of sudden death and significant septal thickness and hyperenhancing on cardiac MRI.   He has been noncompliant with medication   He continues to be significantly short of breath this has been managed with medications which he has recently resumed. This has not changed since his device is implanted.   Past Medical History  Diagnosis Date  . HCM     06 19/2011 ?[facility] ?Tee to R/o subaortic membrane   . Implantable cardiac defibrillator Medtronic   . Atrial fibrillation     Inappro shock  . Inappropriate shocks from ICD --AFib     Past Surgical History  Procedure Laterality Date  . Defibrillator implantation      Medtronic Protecta 314 DRG    Current Outpatient Prescriptions  Medication Sig Dispense Refill  . metoprolol (LOPRESSOR) 50 MG tablet Take 1 tablet (50 mg total) by mouth 2 (two) times daily.  60 tablet  6  . verapamil (CALAN-SR) 180 MG CR tablet Take 1 tablet (180 mg total) by mouth daily.  30 tablet  6   No current facility-administered medications for this visit.    No Known Allergies  Review of Systems negative except from HPI and PMH  Physical Exam BP 142/90  Pulse 73  Ht 5\' 7"  (1.702 m)  Wt 220 lb (99.791 kg)  BMI 34.45 kg/m2 Well developed and well nourished in no acute distress HENT normal E scleral and icterus clear Neck Supple JVP flat; carotids brisk and full Clear to ausculation   Regular rate and rhythm, no murmurs gallops or rub Soft with active bowel sounds No clubbing cyanosis none Edema Alert and oriented, grossly normal motor and sensory function Skin Warm and Dry    Assessment and  Plan

## 2013-04-11 NOTE — Assessment & Plan Note (Signed)
The patient's device was interrogated.  The information was reviewed. No changes were made in the programming.    

## 2013-04-11 NOTE — Assessment & Plan Note (Signed)
Patient has HCM . This is a high risk marker thromboembolism with his atrial fibrillation we'll begin him on anticoagulation. He will check regarding costs for NOACs. 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  And also discussed the importance of following up in the pedigree for the [place] kindred  Genetic testing was undertaken ion a  cousins daughter at [place]. We will try to get these results.

## 2013-05-04 ENCOUNTER — Encounter: Payer: Self-pay | Admitting: Internal Medicine

## 2013-05-19 ENCOUNTER — Encounter: Payer: Self-pay | Admitting: Cardiovascular Disease

## 2013-05-19 ENCOUNTER — Ambulatory Visit (INDEPENDENT_AMBULATORY_CARE_PROVIDER_SITE_OTHER): Payer: BC Managed Care – PPO | Admitting: Cardiovascular Disease

## 2013-05-19 VITALS — BP 143/91 | HR 86 | Ht 67.0 in | Wt 225.0 lb

## 2013-05-19 DIAGNOSIS — I480 Paroxysmal atrial fibrillation: Secondary | ICD-10-CM | POA: Insufficient documentation

## 2013-05-19 DIAGNOSIS — I4891 Unspecified atrial fibrillation: Secondary | ICD-10-CM

## 2013-05-19 DIAGNOSIS — I422 Other hypertrophic cardiomyopathy: Secondary | ICD-10-CM

## 2013-05-19 DIAGNOSIS — Z95810 Presence of automatic (implantable) cardiac defibrillator: Secondary | ICD-10-CM

## 2013-05-19 LAB — ICD DEVICE OBSERVATION
AL THRESHOLD: 0.625 V
BAMS-0001: 170 {beats}/min
BATTERY VOLTAGE: 3.098 V
CHARGE TIME: 9.269 s
DEV-0020ICD: NEGATIVE
FVT: 0
PACEART VT: 0
RV LEAD AMPLITUDE: 11 mv
RV LEAD THRESHOLD: 0.625 V
TZAT-0001ATACH: 1
TZAT-0001ATACH: 2
TZAT-0001SLOWVT: 1
TZAT-0001SLOWVT: 2
TZAT-0002ATACH: NEGATIVE
TZAT-0002ATACH: NEGATIVE
TZAT-0002FASTVT: NEGATIVE
TZAT-0004SLOWVT: 8
TZAT-0004SLOWVT: 8
TZAT-0005SLOWVT: 88 pct
TZAT-0005SLOWVT: 91 pct
TZAT-0011SLOWVT: 10 ms
TZAT-0011SLOWVT: 10 ms
TZAT-0013SLOWVT: 2
TZAT-0018ATACH: NEGATIVE
TZAT-0018ATACH: NEGATIVE
TZAT-0018ATACH: NEGATIVE
TZAT-0018FASTVT: NEGATIVE
TZAT-0019ATACH: 6 V
TZAT-0019FASTVT: 8 V
TZAT-0020FASTVT: 1.5 ms
TZON-0003ATACH: 350 ms
TZON-0004SLOWVT: 16
TZON-0004VSLOWVT: 20
TZON-0005SLOWVT: 12
TZST-0001FASTVT: 3
TZST-0001FASTVT: 6
TZST-0001SLOWVT: 3
TZST-0001SLOWVT: 4
TZST-0001SLOWVT: 6
TZST-0002ATACH: NEGATIVE
TZST-0002ATACH: NEGATIVE
TZST-0002FASTVT: NEGATIVE
TZST-0002FASTVT: NEGATIVE
TZST-0002FASTVT: NEGATIVE
TZST-0003SLOWVT: 25 J
TZST-0003SLOWVT: 35 J
VENTRICULAR PACING ICD: 0.19 pct
VF: 0

## 2013-05-19 NOTE — Assessment & Plan Note (Signed)
No shocks since last seen by SK Will have it interogated today

## 2013-05-19 NOTE — Assessment & Plan Note (Signed)
Stable with minimal dyspnea and no outflow tract murmur on exam today

## 2013-05-19 NOTE — Patient Instructions (Signed)
Your physician wants you to follow-up in:  6 MONTHS WITH DR NISHAN  You will receive a reminder letter in the mail two months in advance. If you don't receive a letter, please call our office to schedule the follow-up appointment. Your physician recommends that you continue on your current medications as directed. Please refer to the Current Medication list given to you today. 

## 2013-05-19 NOTE — Assessment & Plan Note (Addendum)
Seems to be in NSR today  Device indicates less than 1% PAF.  Will ask SK if Rx for HR greater than 170 should be turned on given his HOCM He will price NOAC and let us know  May be a good candidate for Norpace if PAF is more frequent

## 2013-05-19 NOTE — Progress Notes (Signed)
Patient ID: Charles Wyatt, male   DOB: 21-Oct-1971, 42 y.o.   MRN: [id number] Charles Wyatt is seen today for F/U of HOCM. He had his AICD placed. He had marked family history of sudden death. Septal thickness of 21mm and lots of hyperenhancement on cardiac MRI. He initially felt more energetic and less dyspnic with institution of beta blockers. Since his AICD he feels that he has taken a step backwards with more fatigue and dyspnea especially going up stairs. Iniitial concern post op for RUE sweilling and venous clot but both [name] and venogrphy showed no thrombus. Continues to feel that RUE more swollen. Denies dizzyness or syncope. No SSCP.   Long discussion with Charles Wyatt regarding various facets of HOCM including diatolic dysfunction, outflow tract gradient , MR and sudden death. His mother had surgery at the [facility] clinic. Charles Wyatt thinks he needs to make some lifstyle changes and lose weight to help his symptoms. Despite having limitations from dyspnea and fatigue he does not want to entertain surgery. Verapamil added last visit  Had AICD shock and saw Charles Wyatt had PAF.  He has not started his anticoagulation. 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   Device interogated.  Less than .1% PAF  2 short episodes.  Rx turned off.  Only gets Rx for HR over 200 VT/VF  ROS: Denies fever, malais, weight loss, blurry vision, decreased visual acuity, cough, sputum, SOB, hemoptysis, pleuritic pain, palpitaitons, heartburn, abdominal pain, melena, lower extremity edema, claudication, or rash.  All other systems reviewed and negative  General: Affect appropriate Overweight white male HEENT: right eye ptosis  Neck supple with no adenopathy JVP normal no bruits no thyromegaly Lungs clear with no wheezing and good diaphragmatic motion Heart:  S1/S2 no murmur, no rub, gallop or click PMI normal Abdomen: benighn, BS positve, no tenderness, no AAA no bruit.  No HSM or HJR Distal pulses intact with no  bruits No edema Neuro non-focal Skin warm and dry No muscular weakness AICD under left clavicle   Current Outpatient Prescriptions  Medication Sig Dispense Refill  . metoprolol (LOPRESSOR) 50 MG tablet Take 1 tablet (50 mg total) by mouth 2 (two) times daily.  60 tablet  6  . verapamil (CALAN-SR) 180 MG CR tablet Take 1 tablet (180 mg total) by mouth daily.  30 tablet  6   No current facility-administered medications for this visit.    Allergies  Review of patient's allergies indicates no known allergies.  Electrocardiogram:  04/11/13  SR rate 73  Biphasic T waves  chronic  Assessment and Plan

## 2013-07-17 ENCOUNTER — Encounter: Payer: BC Managed Care – PPO | Admitting: *Deleted

## 2013-07-27 ENCOUNTER — Encounter: Payer: Self-pay | Admitting: *Deleted

## 2013-12-07 ENCOUNTER — Encounter: Payer: Self-pay | Admitting: Cardiovascular Disease

## 2014-02-05 ENCOUNTER — Telehealth: Payer: Self-pay | Admitting: *Deleted

## 2014-02-20 ENCOUNTER — Encounter: Payer: Self-pay | Admitting: *Deleted

## 2014-04-05 ENCOUNTER — Encounter: Payer: Self-pay | Admitting: Internal Medicine

## 2014-05-14 ENCOUNTER — Encounter: Payer: Self-pay | Admitting: *Deleted

## 2015-04-26 ENCOUNTER — Encounter: Payer: Self-pay | Admitting: Internal Medicine

## 2015-04-26 ENCOUNTER — Ambulatory Visit (INDEPENDENT_AMBULATORY_CARE_PROVIDER_SITE_OTHER): Payer: BLUE CROSS/BLUE SHIELD | Admitting: *Deleted

## 2015-04-26 DIAGNOSIS — I422 Other hypertrophic cardiomyopathy: Secondary | ICD-10-CM

## 2015-04-26 DIAGNOSIS — I480 Paroxysmal atrial fibrillation: Secondary | ICD-10-CM | POA: Diagnosis not present

## 2015-04-26 NOTE — Progress Notes (Signed)
ICD check in clinic. Normal device function. Thresholds and sensing consistent with previous device measurements. Impedance trends stable over time. 7 "NSVT" episodes recorded---max dur. 2 sec, Max Avg V 231---NSVT/?AT---No meds. 1 AT/AF episode x 2 hrs 20 mins @ 364/145---No meds. Stable thoracic impedance. Histogram distribution appropriate for patient and level of activity. No changes made this session. Device programmed at appropriate safety margins. Device programmed to optimize intrinsic conduction. Batt voltage 2.99V (ERI 2.63V). Pt  will follow up with SK on 9/30 @0915 . Patient education completed including shock plan. Alert tones demonstrated for patient.

## 2015-05-07 ENCOUNTER — Telehealth: Payer: Self-pay | Admitting: *Deleted

## 2015-05-07 NOTE — Telephone Encounter (Signed)
Patient has not been seen or filled the metoprolol or verapamil since 2014, but would now like to have them refilled. Ok to refill until his appointment? Please advise. Thanks, MI

## 2015-05-07 NOTE — Telephone Encounter (Signed)
Informed patient that I would review with Dr. Graciela HusbandsKlein next week and let him know.  Patient is out of town right now and is fine with waiting - as he has not been taking these medications for awhile now.  States he figured they may want him back on these meds.

## 2015-05-16 NOTE — Telephone Encounter (Signed)
Informed patient we will address restarting/not restarting medications at future office visits in August and September.  He is agreeable to this plan.

## 2015-07-19 ENCOUNTER — Ambulatory Visit: Payer: BLUE CROSS/BLUE SHIELD | Admitting: Cardiovascular Disease

## 2015-07-25 NOTE — Progress Notes (Signed)
Patient ID: Charles Wyatt, male   DOB: 02-23-1971, 44 y.o.   MRN: [id number]   Charles Wyatt is seen today for F/U of HOCM. He had his AICD placed. He had marked family history of sudden death. Septal thickness of 21mm and lots of hyperenhancement on cardiac MRI. He initially felt more energetic and less dyspnic with institution of beta blockers. Since his AICD he feels that he has taken a step backwards with more fatigue and dyspnea especially going up stairs. Iniitial concern post op for RUE sweilling and venous clot but both Charles Wyatt and venogrphy showed no thrombus. Continues to feel that RUE more swollen. Denies dizzyness or syncope. No SSCP.   Long discussion with Charles Wyatt regarding various facets of HOCM including diatolic dysfunction, outflow tract gradient , MR and sudden death. His mother had surgery at the [facility]. Charles Wyatt thinks he needs to make some lifstyle changes and lose weight to help his symptoms. Despite having limitations from dyspnea and fatigue he does not want to entertain surgery.Verapamil added last visit  Had AICD shock 2014 and saw Charles Wyatt had PAF.  03/2015  1 AT/AF episode x 2 hrs 20 mins @ 364 Started on anticoagulation by Dr Charles Wyatt 2014     He has not started his anticoagulation. 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 He stopped metroprolol about a year ago   Device interogated.  Less than .1% PAF  2 short episodes.  Rx turned off.  Only gets Rx for HR over 200 VT/VF  ROS: Denies fever, malais, weight loss, blurry vision, decreased visual acuity, cough, sputum, SOB, hemoptysis, pleuritic pain, palpitaitons, heartburn, abdominal pain, melena, lower extremity edema, claudication, or rash.  All other systems reviewed and negative  General: Affect appropriate Overweight white male HEENT: right eye ptosis  Neck supple with no adenopathy JVP normal no bruits no thyromegaly Lungs clear with no wheezing and good diaphragmatic motion Heart:  S1/S2 SEM only with  valsalva   , no rub, gallop or click PMI normal Abdomen: benighn, BS positve, no tenderness, no AAA no bruit.  No HSM or HJR Distal pulses intact with no bruits No edema Neuro non-focal Skin warm and dry No muscular weakness AICD under left clavicle   Current Outpatient Prescriptions  Medication Sig Dispense Refill  . metoprolol (LOPRESSOR) 50 MG tablet Take 1 tablet (50 mg total) by mouth 2 (two) times daily. 60 tablet 6  . metoprolol (LOPRESSOR) 50 MG tablet Take 1 tablet (50 mg total) by mouth 2 (two) times daily. 60 tablet 11   No current facility-administered medications for this visit.    Allergies  Review of patient's allergies indicates no known allergies.  Electrocardiogram:  04/11/13  SR rate 73  Biphasic T waves  Chronic  07/29/15  SR rate 84  Same biphasic T waves especially laterally  Assessment and Plan  HOCM: Restart beta blocker  F/u 3 months and consider adding verapamil AICD: normal function no PAF on PACEART PAF:  Maint NSR  ASA add back beta blocker  F/U with me 3 months  Resume metroprolol  Charles Wyatt

## 2015-07-29 ENCOUNTER — Ambulatory Visit (INDEPENDENT_AMBULATORY_CARE_PROVIDER_SITE_OTHER): Payer: BLUE CROSS/BLUE SHIELD | Admitting: Cardiovascular Disease

## 2015-07-29 ENCOUNTER — Encounter: Payer: Self-pay | Admitting: Cardiovascular Disease

## 2015-07-29 DIAGNOSIS — I422 Other hypertrophic cardiomyopathy: Secondary | ICD-10-CM | POA: Diagnosis not present

## 2015-07-29 DIAGNOSIS — I480 Paroxysmal atrial fibrillation: Secondary | ICD-10-CM

## 2015-07-29 MED ORDER — METOPROLOL TARTRATE 50 MG PO TABS: 50.0000 mg | ORAL_TABLET | Freq: Two times a day (BID) | ORAL | Status: DC

## 2015-07-29 NOTE — Patient Instructions (Signed)
Medication Instructions:  RESTART METOPROLOL  Labwork: NONE  Testing/Procedures: NONE  Follow-Up: Your physician recommends that you schedule a follow-up appointment in: 3 MONTHS WITH DR Eden Emms   Any Other Special Instructions Will Be Listed Below (If Applicable).

## 2015-08-30 ENCOUNTER — Encounter: Payer: Self-pay | Admitting: Internal Medicine

## 2015-08-30 ENCOUNTER — Ambulatory Visit (INDEPENDENT_AMBULATORY_CARE_PROVIDER_SITE_OTHER): Payer: BLUE CROSS/BLUE SHIELD | Admitting: Internal Medicine

## 2015-08-30 VITALS — BP 122/100 | HR 79 | Ht 67.0 in | Wt 232.6 lb

## 2015-08-30 DIAGNOSIS — I422 Other hypertrophic cardiomyopathy: Secondary | ICD-10-CM | POA: Diagnosis not present

## 2015-08-30 DIAGNOSIS — Z4502 Encounter for adjustment and management of automatic implantable cardiac defibrillator: Secondary | ICD-10-CM

## 2015-08-30 DIAGNOSIS — Z95810 Presence of automatic (implantable) cardiac defibrillator: Secondary | ICD-10-CM | POA: Diagnosis not present

## 2015-08-30 DIAGNOSIS — I480 Paroxysmal atrial fibrillation: Secondary | ICD-10-CM | POA: Diagnosis not present

## 2015-08-30 NOTE — Progress Notes (Signed)
      Patient Care Team: No Pcp Per Patient as PCP - General (General Practice)   HPI  Charles Wyatt is a 44 y.o. male  seen in followup for an ICD  The device originally implanted for HCM in the setting of a family history of sudden death and significant septal thickness and hyperenhancing on cardiac MRI. He has hx of inappropriate shock from AFib RVR He has been noncompliant with medication He had stopped his medications; they were resumed recently. He denies symptoms of shortness of breath or exercise intolerance.  He has daytime somnolence and sleep disordered breathing.  His family history was reviewed. It seems that the gene runs through his mother family and a large kindred has been identified and is being followed in [place] and [place]   Records and Results Reviewed PN office note  Past Medical History  Diagnosis Date  . HCM     06 19/2011 ?[facility] ?Tee to R/o subaortic membrane   . Implantable cardiac defibrillator Medtronic   . Atrial fibrillation     Inappro shock  . Inappropriate shocks from ICD --AFib     Past Surgical History  Procedure Laterality Date  . Defibrillator implantation      Medtronic Protecta 314 DRG    Current Outpatient Prescriptions  Medication Sig Dispense Refill  . metoprolol (LOPRESSOR) 50 MG tablet Take 1 tablet (50 mg total) by mouth 2 (two) times daily. 60 tablet 6   No current facility-administered medications for this visit.    No Known Allergies    Review of Systems negative except from HPI and PMH  Physical Exam BP 122/100 mmHg  Pulse 79  Ht  (1.702 m)  Wt 232 lb 9.6 oz (105.507 kg)  BMI 36.42 kg/m2 Well developed and well nourished in no acute distress HENT normal E scleral and icterus clear Neck Supple JVP flat; carotids brisk and full Clear to ausculation Device pocket well healed; without hematoma or erythema.  There is no tethering *Regular rate and rhythm, no murmurs gallops or rub Soft with active bowel  sounds No clubbing cyanosis  Edema Alert and oriented, grossly normal motor and sensory function Skin Warm and Dry  ECG demonstrates sinus rhythm at 79 Intervals 18/10/38  Assessment and  Plan  HCM  ICD Medtronic   Obesity  OSA  The patient's device is functioning normally.  Family been followed in [place]. Patient is reluctant to consider a sleep study as he is disinclined to pursue CPAP.  Given his paucity of symptoms, we will not initiate therapy with a calcium blocker at this time  We will see him in one year

## 2015-08-30 NOTE — Patient Instructions (Signed)
Medication Instructions:  Your physician recommends that you continue on your current medications as directed. Please refer to the Current Medication list given to you today.  Labwork: None ordered  Testing/Procedures: None ordered  Follow-Up: Remote monitoring is used to monitor your Pacemaker of ICD from home. This monitoring reduces the number of office visits required to check your device to one time per year. It allows [name] to keep an eye on the functioning of your device to ensure it is working properly. You are scheduled for a device check from home on 12/03/15. You may send your transmission at any time that day. If you have a wireless device, the transmission will be sent automatically. After your physician reviews your transmission, you will receive a postcard with your next transmission date.  Your physician wants you to follow-up in: 1 year with Gypsy Balsam, NP.  You will receive a reminder letter in the mail two months in advance. If you don't receive a letter, please call our office to schedule the follow-up appointment.  Any Other Special Instructions Will Be Listed Below (If Applicable). Thank you for choosing [facility]!!

## 2015-09-04 NOTE — Progress Notes (Signed)
Unable to route PCP report secondary to no PCP per patient. 

## 2015-09-05 LAB — CUP PACEART INCLINIC DEVICE CHECK
Battery Voltage: 2.97 V
Brady Statistic AP VP Percent: 0.64 %
Brady Statistic AS VP Percent: 0.05 %
Brady Statistic AS VS Percent: 98.51 %
Brady Statistic RA Percent Paced: 1.44 %
Brady Statistic RV Percent Paced: 0.69 %
HIGH POWER IMPEDANCE MEASURED VALUE: 304 Ohm
HIGH POWER IMPEDANCE MEASURED VALUE: 63 Ohm
HIGH POWER IMPEDANCE MEASURED VALUE: 74 Ohm
HighPow Impedance: 133 Ohm
Lead Channel Impedance Value: 361 Ohm
Lead Channel Pacing Threshold Amplitude: 0.5 V
Lead Channel Pacing Threshold Amplitude: 0.75 V
Lead Channel Pacing Threshold Pulse Width: 0.4 ms
Lead Channel Pacing Threshold Pulse Width: 0.4 ms
Lead Channel Sensing Intrinsic Amplitude: 9.625 mV
Lead Channel Setting Pacing Amplitude: 2 V
Lead Channel Setting Sensing Sensitivity: 0.3 mV
MDC IDC MSMT LEADCHNL RA IMPEDANCE VALUE: 399 Ohm
MDC IDC MSMT LEADCHNL RA SENSING INTR AMPL: 4.875 mV
MDC IDC SESS DTM: 20160930134449
MDC IDC SET LEADCHNL RV PACING AMPLITUDE: 2.5 V
MDC IDC SET LEADCHNL RV PACING PULSEWIDTH: 0.4 ms
MDC IDC SET ZONE DETECTION INTERVAL: 290 ms
MDC IDC STAT BRADY AP VS PERCENT: 0.8 %
Zone Setting Detection Interval: 340 ms
Zone Setting Detection Interval: 350 ms
Zone Setting Detection Interval: 450 ms

## 2015-09-17 ENCOUNTER — Encounter: Payer: Self-pay | Admitting: Internal Medicine

## 2015-10-31 ENCOUNTER — Encounter: Payer: Self-pay | Admitting: Cardiovascular Disease

## 2015-10-31 NOTE — Progress Notes (Signed)
Patient ID: Charles DialsMarshall Wyatt, male   DOB: 12/30/1970, 44 y.o.   MRN: [id number]   Charles Wyatt is seen today for F/U of HOCM. He had his AICD placed. He had marked family history of sudden death. Septal thickness of 21mm and lots of hyperenhancement on cardiac MRI. He initially felt more energetic and less dyspnic with institution of beta blockers. Since his AICD he feels that he has taken a step backwards with more fatigue and dyspnea especially going up stairs. Iniitial concern post op for RUE sweilling and venous clot but both US and venogrphy showed no thrombus. Continues to feel that RUE more swollen. Denies dizzyness or syncope. No SSCP.   Long discussion with Charles Wyatt regarding various facets of HOCM including diatolic dysfunction, outflow tract gradient , MR and sudden death. His [facility]. Charles Wyatt thinks he needs to make some lifstyle changes and lose weight to help his symptoms. Despite having limitations from dyspnea and fatigue he does not want to entertain surgery.Verapamil added last visit  Had AICD shock 2014 and saw SK had PAF.  03/2015  1 AT/AF episode x 2 hrs 20 mins @ 364 Started on anticoagulation by Dr Graciela HusbandsKlein 2014     He has not started his anticoagulation. 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 He stopped metroprolol about a year ago   Device interogated.  Less than .1% PAF  2 short episodes.  Rx turned off.  Only gets Rx for HR over 200 VT/VF  Likely OSA but does not want to pursue sleep study / or CPAP  ROS: Denies fever, malais, weight loss, blurry vision, decreased visual acuity, cough, sputum, SOB, hemoptysis, pleuritic pain, palpitaitons, heartburn, abdominal pain, melena, lower extremity edema, claudication, or rash.  All other systems reviewed and negative  General: Affect appropriate Overweight white male HEENT: right eye ptosis  Neck supple with no adenopathy JVP normal no bruits no thyromegaly Lungs clear with no wheezing  and good diaphragmatic motion Heart:  S1/S2 SEM only with valsalva   , no rub, gallop or click PMI normal Abdomen: benighn, BS positve, no tenderness, no AAA no bruit.  No HSM or HJR Distal pulses intact with no bruits No edema Neuro non-focal Skin warm and dry No muscular weakness AICD under left clavicle   Current Outpatient Prescriptions  Medication Sig Dispense Refill  . metoprolol (LOPRESSOR) 50 MG tablet Take 1 tablet (50 mg total) by mouth 2 (two) times daily. 60 tablet 6   No current facility-administered medications for this visit.    Allergies  Review of patient's allergies indicates no known allergies.  Electrocardiogram:  04/11/13  SR rate 73  Biphasic T waves  Chronic  07/29/15  SR rate 84  Same biphasic T waves especially laterally  Assessment and Plan  HOCM: no symptoms better compliance with beta blocker  AICD: normal function no PAF on PACEART f/u SK 6 months  PAF:  Maint NSR  ASA add back beta blocker YQI:[id number]:travels a lot will not wear CPAP   Charlton HawsPeter Nishan

## 2015-11-01 ENCOUNTER — Ambulatory Visit (INDEPENDENT_AMBULATORY_CARE_PROVIDER_SITE_OTHER): Payer: BLUE CROSS/BLUE SHIELD | Admitting: Cardiovascular Disease

## 2015-11-01 ENCOUNTER — Encounter: Payer: Self-pay | Admitting: Cardiovascular Disease

## 2015-11-01 ENCOUNTER — Ambulatory Visit: Payer: BLUE CROSS/BLUE SHIELD | Admitting: Cardiovascular Disease

## 2015-11-01 VITALS — BP 110/74 | HR 84 | Ht 67.0 in | Wt 234.8 lb

## 2015-11-01 DIAGNOSIS — I4891 Unspecified atrial fibrillation: Secondary | ICD-10-CM

## 2015-11-01 DIAGNOSIS — R000 Tachycardia, unspecified: Secondary | ICD-10-CM | POA: Diagnosis not present

## 2015-11-01 DIAGNOSIS — I422 Other hypertrophic cardiomyopathy: Secondary | ICD-10-CM | POA: Diagnosis not present

## 2015-11-01 MED ORDER — METOPROLOL TARTRATE 50 MG PO TABS: 50.0000 mg | ORAL_TABLET | Freq: Two times a day (BID) | ORAL | Status: DC

## 2015-11-01 NOTE — Patient Instructions (Signed)

## 2015-12-03 ENCOUNTER — Telehealth: Payer: Self-pay | Admitting: Cardiology

## 2015-12-03 ENCOUNTER — Encounter: Payer: BLUE CROSS/BLUE SHIELD | Admitting: *Deleted

## 2015-12-03 NOTE — Telephone Encounter (Signed)
Confirmed remote transmission w/ pt and he stated he will send transmission once he gets home.

## 2015-12-06 ENCOUNTER — Encounter: Payer: Self-pay | Admitting: Cardiology

## 2016-11-09 ENCOUNTER — Emergency Department (HOSPITAL_COMMUNITY)
Admission: EM | Admit: 2016-11-09 | Discharge: 2016-11-09 | Disposition: A | Discharge status: Home / Self Care | Payer: BLUE CROSS/BLUE SHIELD | Attending: Emergency Medicine | Admitting: Emergency Medicine

## 2016-11-09 ENCOUNTER — Encounter (HOSPITAL_COMMUNITY): Payer: Self-pay | Admitting: Emergency Medicine

## 2016-11-09 DIAGNOSIS — T82121A Displacement of cardiac pulse generator (battery), initial encounter: Secondary | ICD-10-CM | POA: Insufficient documentation

## 2016-11-09 DIAGNOSIS — I422 Other hypertrophic cardiomyopathy: Secondary | ICD-10-CM

## 2016-11-09 DIAGNOSIS — I472 Ventricular tachycardia, unspecified: Secondary | ICD-10-CM

## 2016-11-09 DIAGNOSIS — Y828 Other medical devices associated with adverse incidents: Secondary | ICD-10-CM | POA: Insufficient documentation

## 2016-11-09 DIAGNOSIS — I4891 Unspecified atrial fibrillation: Secondary | ICD-10-CM

## 2016-11-09 DIAGNOSIS — I499 Cardiac arrhythmia, unspecified: Secondary | ICD-10-CM

## 2016-11-09 DIAGNOSIS — T754XXA Electrocution, initial encounter: Secondary | ICD-10-CM | POA: Insufficient documentation

## 2016-11-09 DIAGNOSIS — R000 Tachycardia, unspecified: Secondary | ICD-10-CM

## 2016-11-09 LAB — CBC WITH DIFFERENTIAL/PLATELET
BASOS ABS: 0 10*3/uL (ref 0.0–0.1)
BASOS PCT: 0 %
EOS ABS: 0.1 10*3/uL (ref 0.0–0.7)
EOS PCT: 1 %
HCT: 39.6 % (ref 39.0–52.0)
Hemoglobin: 13.5 g/dL (ref 13.0–17.0)
Lymphocytes Relative: 17 %
Lymphs Abs: 1.6 10*3/uL (ref 0.7–4.0)
MCH: 32.4 pg (ref 26.0–34.0)
MCHC: 34.1 g/dL (ref 30.0–36.0)
MCV: 95 fL (ref 78.0–100.0)
MONO ABS: 0.5 10*3/uL (ref 0.1–1.0)
MONOS PCT: 5 %
NEUTROS ABS: 7.5 10*3/uL (ref 1.7–7.7)
Neutrophils Relative %: 77 %
PLATELETS: 272 10*3/uL (ref 150–400)
RBC: 4.17 MIL/uL — ABNORMAL LOW (ref 4.22–5.81)
RDW: 12.8 % (ref 11.5–15.5)
WBC: 9.7 10*3/uL (ref 4.0–10.5)

## 2016-11-09 LAB — BASIC METABOLIC PANEL
ANION GAP: 11 (ref 5–15)
BUN: 17 mg/dL (ref 6–20)
CALCIUM: 9 mg/dL (ref 8.9–10.3)
CO2: 21 mmol/L — AB (ref 22–32)
CREATININE: 1.27 mg/dL — AB (ref 0.61–1.24)
Chloride: 106 mmol/L (ref 101–111)
GLUCOSE: 155 mg/dL — AB (ref 65–99)
Potassium: 3.8 mmol/L (ref 3.5–5.1)
Sodium: 138 mmol/L (ref 135–145)

## 2016-11-09 LAB — TROPONIN I: Troponin I: 0.05 ng/mL (ref <0.03)

## 2016-11-09 MED ORDER — POTASSIUM CHLORIDE CRYS ER 20 MEQ PO TBCR
40.0000 meq | EXTENDED_RELEASE_TABLET | Freq: Once | ORAL | Status: AC
  Administered 2016-11-09: 40 meq via ORAL
  Filled 2016-11-09: qty 2

## 2016-11-09 MED ORDER — METOPROLOL TARTRATE 50 MG PO TABS: 50.0000 mg | ORAL_TABLET | Freq: Two times a day (BID) | ORAL | 0 refills | Status: DC

## 2016-11-09 MED ORDER — METOPROLOL TARTRATE 25 MG PO TABS
50.0000 mg | ORAL_TABLET | Freq: Once | ORAL | Status: AC
  Administered 2016-11-09: 50 mg via ORAL
  Filled 2016-11-09: qty 2

## 2016-11-09 NOTE — Progress Notes (Signed)
Called by the ED to review ICD EGMs after patient presented with 3 shocks from his device.  On review he had a single episode of VF with cycle lengths of 200-220 ms which required 3 35J shocks to terminate.  I discussed admitting him of observation with re-initiation of his home metoprolol however, he would prefer to go home and follow-up with his cardiologist this week.  I stressed to him he needs to make sure he follows up and must restart his metoprolol.  I will send a note to his cardiologist.  Charles SnufferGregory Aqil Goetting, MD, PhD Cardiology

## 2016-11-09 NOTE — ED Provider Notes (Signed)
[facility]-EMERGENCY DEPT Provider Note   CSN: [id number] Arrival date & time: 11/09/16  0103     History   Chief Complaint Chief Complaint  Patient presents with  . Pacemaker Problem    HPI Charles Wyatt is a 45 y.o. male.  He had an implanted pacemaker defibrillator inserted in 2011. Tonight, while having sex, defibrillator went off 3 times. He denies any palpitations, dizziness, lightheadedness, chest pain. He feels fine now. This is the first time it has gone off since shortly after he had inserted. He does admit that he has not been taking his metoprolol for several months. He also has missed follow-up appointments with his cardiologist.   The history is provided by the patient.    Past Medical History:  Diagnosis Date  . Atrial fibrillation (HCC)    Inappro shock  . HCM    06 19/2011 ?[facility] ?Tee to R/o subaortic membrane   . Implantable cardiac defibrillator Medtronic   . 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 PAF (paroxysmal atrial fibrillation) (HCC) 05/19/2013  . Implantable defibrillator-Medtronic 02/02/2011  . OBESITY 05/20/2010  . Hypertrophic cardiomyopathy (HCC) 05/20/2010  . ANEMIA 05/19/2010  . MURMUR 05/19/2010    Past Surgical History:  Procedure Laterality Date  . Defibrillator implantation     Medtronic Protecta 314 DRG       Home Medications    Prior to Admission medications   Medication Sig Start Date End Date Taking? Authorizing Provider  metoprolol (LOPRESSOR) 50 MG tablet Take 1 tablet (50 mg total) by mouth 2 (two) times daily. 11/01/15   Wendall StadePeter C Nishan, MD    Family History Family History  Problem Relation Age of Onset  . Cardiomyopathy Other     hypertensive obstructive cardiomyopathy    Social History Social History  Substance Use Topics  . Smoking status: Never Smoker  . Smokeless tobacco: Never Used  . Alcohol use Yes     Comment: occasional      Allergies     Patient has no known allergies.   Review of Systems Review of Systems  All other systems reviewed and are negative.    Physical Exam Updated Vital Signs BP 126/89   Pulse 112   Temp 99.2 F (37.3 C) (Oral)   Resp 17   Ht 5\' 7"  (1.702 m)   Wt 231 lb (104.8 kg)   SpO2 97%   BMI 36.18 kg/m   Physical Exam  Nursing note and vitals reviewed.  45 year old male, resting comfortably and in no acute distress. Vital signs are significant for tachycardia. Oxygen saturation is 97%, which is normal. Head is normocephalic and atraumatic. PERRLA, EOMI. Oropharynx is clear. Neck is nontender and supple without adenopathy or JVD. Back is nontender and there is no CVA tenderness. Lungs are clear without rales, wheezes, or rhonchi. Chest is nontender. Heart has regular rate and rhythm without murmur. Abdomen is soft, flat, nontender without masses or hepatosplenomegaly and peristalsis is normoactive. Extremities have no cyanosis or edema, full range of motion is present. Skin is warm and dry without rash. Neurologic: Mental status is normal, cranial nerves are intact, there are no motor or sensory deficits.  ED Treatments / Results  Labs (all labs ordered are listed, but only abnormal results are displayed) Labs Reviewed  BASIC METABOLIC PANEL - Abnormal; Notable for the following:       Result Value   CO2 21 (*)  Glucose, Bld 155 (*)    Creatinine, Ser 1.27 (*)    All other components within normal limits  CBC WITH DIFFERENTIAL/PLATELET - Abnormal; Notable for the following:    RBC 4.17 (*)    All other components within normal limits  TROPONIN I - Abnormal; Notable for the following:    Troponin I 0.05 (*)    All other components within normal limits    EKG  EKG Interpretation  Date/Time:  Monday November 09 2016 01:14:09 EST Ventricular Rate:  125 PR Interval:    QRS Duration: 110 QT Interval:  315 QTC Calculation: 455 R Axis:   -27 Text Interpretation:  Sinus  tachycardia Abnormal R-wave progression, early transition ST elevation, consider anterolateral injury When compared with ECG of 05/17/2010, No significant change was found Confirmed by [facility]  MD, Vrishank Moster ([id number]) on 11/09/2016 1:34:22 AM       Procedures Procedures (including critical care time)  Medications Ordered in ED Medications  potassium chloride SA (K-DUR,KLOR-CON) CR tablet 40 mEq (40 mEq Oral Given 11/09/16 0423)  metoprolol tartrate (LOPRESSOR) tablet 50 mg (50 mg Oral Given 11/09/16 0423)     Initial Impression / Assessment and Plan / ED Course  I have reviewed the triage vital signs and the nursing notes.  Pertinent labs & imaging results that were available during my care of the patient were reviewed by me and considered in my medical decision making (see chart for details).  Clinical Course    Defibrillator discharge 3. Device will be interrogated. Screening labs obtained. Patient was counseled on need to maintain follow-up with his cardiologist and to take medications as prescribed. Old records are reviewed confirming that he is followed by a cardiologist for implanted defibrillator to treat hypertrophic cardiomyopathy. Device was inserted because of strong family history of sudden death.  Laboratory workup shows mild bump in troponin which is not expected in the setting of 3 shocks. Interrogation of device reports 3 shocks for what was diagnoses ventricular tachycardia/ventricular fibrillation. Will request consultation with cardiology.  Cardiology consultant confirms patient received shocks for ventricular tachycardia. Consultant recommended that the patient stay in the hospital for observation but patient refused. Therefore, he is discharged with prescription for the metoprolol that he was supposed to be taking. He is urged to follow-up with his cardiologists. He is welcome to return to the ED if he is having any problems.  Final Clinical Impressions(s) / ED Diagnoses    Final diagnoses:  Ventricular tachycardia (HCC)  Shockable cardiac rhythm detected by automated external defibrillator    New Prescriptions Current Discharge Medication List       Dione Boozeavid Boykin Baetz, MD 11/09/16 [phone]

## 2016-11-09 NOTE — ED Notes (Signed)
EDP at bedside  

## 2016-11-09 NOTE — Discharge Instructions (Signed)
Make sure you take the medication as prescribed. Discuss any change in medication with your cardiologist before doing it.

## 2016-11-09 NOTE — ED Triage Notes (Signed)
Patient exerting himself and defibrillator shocked him 3 times, 10-15 seconds apart. [place]. Alert and oriented x 4. No complaints. Patient "wants to go home" 141/96, 120, resp 20, 100 room air. 0/10 pain.

## 2016-11-09 NOTE — Progress Notes (Signed)
Patient ID: Charles Wyatt, male   DOB: 09/23/1971, 45 y.o.   MRN: [id number]   Charles Wyatt is seen today for F/U of HOCM. He had his AICD placed. He had marked family history of sudden death. Septal thickness of 21mm and lots of hyperenhancement on cardiac MRI. He initially felt more energetic and less dyspnic with institution of beta blockers. Since his AICD he feels that he has taken a step backwards with more fatigue and dyspnea especially going up stairs. Iniitial concern post op for RUE sweilling and venous clot but both US and venogrphy showed no thrombus. Continues to feel that RUE more swollen. Denies dizzyness or syncope. No SSCP.   Long discussion with Charles Wyatt regarding various facets of HOCM including diatolic dysfunction, outflow tract gradient , MR and sudden death. [facility]. Charles Wyatt thinks he needs to make some lifstyle changes and lose weight to help his symptoms. Despite having limitations from dyspnea and fatigue he does not want to entertain surgery.Verapamil added last visit  Had AICD shock 2014 and saw SK had PAF.  03/2015  1 AT/AF episode x 2 hrs 20 mins @ 364 Started on anticoagulation by Dr Graciela HusbandsKlein 2014  However he was not compliant with beta blocker And anticoagulation     Device reprogrammed  Less than .1% PAF  2 short episodes.  Rx turned off.  Only gets Rx for HR over 200 VT/VF  Likely OSA but does not want to pursue sleep study / or CPAP  Seen in ER 12/11 for AICD shock x 3 35 J defib for VF cycle lengths 200-23820ms  Told to be admitted but didn't want to Also told to restart beta blocker Episode occurred During sex.  Admits to non compliance and also was taking a weight loss supplement That likely had a stimulant in it  Discussed OSA and w/u he is not willing to have sleep study at this time   ROS: Denies fever, malais, weight loss, blurry vision, decreased visual acuity, cough, sputum, SOB, hemoptysis, pleuritic pain, palpitaitons,  heartburn, abdominal pain, melena, lower extremity edema, claudication, or rash.  All other systems reviewed and negative  General: Affect appropriate Overweight white male HEENT: right eye ptosis  Neck supple with no adenopathy JVP normal no bruits no thyromegaly Lungs clear with no wheezing and good diaphragmatic motion Heart:  S1/S2 SEM only with valsalva   , no rub, gallop or click PMI normal Abdomen: benighn, BS positve, no tenderness, no AAA no bruit.  No HSM or HJR Distal pulses intact with no bruits No edema Neuro non-focal Skin warm and dry No muscular weakness AICD under left clavicle   Current Outpatient Prescriptions  Medication Sig Dispense Refill  . metoprolol (LOPRESSOR) 50 MG tablet Take 1 tablet (50 mg total) by mouth 2 (two) times daily. 60 tablet 0   No current facility-administered medications for this visit.     Allergies  Patient has no known allergies.  Electrocardiogram:  04/11/13  SR rate 73  Biphasic T waves  Chronic  07/29/15  SR rate 84  Same biphasic T waves especially laterally  Assessment and Plan  HOCM: needs f/u echo to assess gradients on beta blocker no murmur on exam likely non obstructive AICD: Appropriate d/c for VF seeing Graciela HusbandsKlein today may be just adding beta blocker back is enough for now But may need to reprogram and battery is also low  PAF:  Maint NSR  ASA add back beta blocker does not want  to be on anticoagulation  [id number]:travels a lot will not wear CPAP K:  K was 3.8 in ER will repeat not on diuretic   Charlton HawsPeter Wasil Wolke

## 2016-11-11 ENCOUNTER — Encounter: Payer: Self-pay | Admitting: Internal Medicine

## 2016-11-12 ENCOUNTER — Ambulatory Visit (INDEPENDENT_AMBULATORY_CARE_PROVIDER_SITE_OTHER): Payer: BLUE CROSS/BLUE SHIELD | Admitting: Internal Medicine

## 2016-11-12 ENCOUNTER — Ambulatory Visit (INDEPENDENT_AMBULATORY_CARE_PROVIDER_SITE_OTHER): Payer: BLUE CROSS/BLUE SHIELD | Admitting: Cardiovascular Disease

## 2016-11-12 ENCOUNTER — Encounter: Payer: Self-pay | Admitting: Internal Medicine

## 2016-11-12 ENCOUNTER — Encounter: Payer: Self-pay | Admitting: Cardiovascular Disease

## 2016-11-12 ENCOUNTER — Encounter (INDEPENDENT_AMBULATORY_CARE_PROVIDER_SITE_OTHER): Payer: Self-pay

## 2016-11-12 VITALS — BP 122/80 | HR 94 | Ht 67.0 in | Wt 236.0 lb

## 2016-11-12 VITALS — BP 120/90 | HR 87 | Ht 67.0 in | Wt 235.0 lb

## 2016-11-12 DIAGNOSIS — I422 Other hypertrophic cardiomyopathy: Secondary | ICD-10-CM | POA: Diagnosis not present

## 2016-11-12 DIAGNOSIS — R000 Tachycardia, unspecified: Secondary | ICD-10-CM | POA: Diagnosis not present

## 2016-11-12 DIAGNOSIS — I480 Paroxysmal atrial fibrillation: Secondary | ICD-10-CM

## 2016-11-12 DIAGNOSIS — Z95810 Presence of automatic (implantable) cardiac defibrillator: Secondary | ICD-10-CM | POA: Diagnosis not present

## 2016-11-12 DIAGNOSIS — I517 Cardiomegaly: Secondary | ICD-10-CM

## 2016-11-12 DIAGNOSIS — I4891 Unspecified atrial fibrillation: Secondary | ICD-10-CM

## 2016-11-12 DIAGNOSIS — E876 Hypokalemia: Secondary | ICD-10-CM

## 2016-11-12 LAB — CUP PACEART INCLINIC DEVICE CHECK
Battery Voltage: 2.67 V
Brady Statistic AS VS Percent: 99.47 %
HIGH POWER IMPEDANCE MEASURED VALUE: 68 Ohm
HighPow Impedance: 304 Ohm
HighPow Impedance: 75 Ohm
Implantable Lead Implant Date: 20110921
Implantable Lead Model: 6947
Implantable Pulse Generator Implant Date: 20110921
Lead Channel Impedance Value: 418 Ohm
Lead Channel Pacing Threshold Amplitude: 0.875 V
Lead Channel Pacing Threshold Pulse Width: 0.4 ms
Lead Channel Pacing Threshold Pulse Width: 0.4 ms
Lead Channel Sensing Intrinsic Amplitude: 4.625 mV
Lead Channel Setting Sensing Sensitivity: 0.3 mV
MDC IDC LEAD IMPLANT DT: 20110921
MDC IDC LEAD LOCATION: 753859
MDC IDC LEAD LOCATION: 753860
MDC IDC MSMT LEADCHNL RA SENSING INTR AMPL: 4.625 mV
MDC IDC MSMT LEADCHNL RV IMPEDANCE VALUE: 361 Ohm
MDC IDC MSMT LEADCHNL RV PACING THRESHOLD AMPLITUDE: 0.5 V
MDC IDC MSMT LEADCHNL RV SENSING INTR AMPL: 10.625 mV
MDC IDC MSMT LEADCHNL RV SENSING INTR AMPL: 10.625 mV
MDC IDC SESS DTM: 20171214165131
MDC IDC SET LEADCHNL RA PACING AMPLITUDE: 2 V
MDC IDC SET LEADCHNL RV PACING AMPLITUDE: 2.5 V
MDC IDC SET LEADCHNL RV PACING PULSEWIDTH: 0.4 ms
MDC IDC STAT BRADY AP VP PERCENT: 0.16 %
MDC IDC STAT BRADY AP VS PERCENT: 0.33 %
MDC IDC STAT BRADY AS VP PERCENT: 0.03 %
MDC IDC STAT BRADY RA PERCENT PACED: 0.49 %
MDC IDC STAT BRADY RV PERCENT PACED: 0.16 %

## 2016-11-12 LAB — MAGNESIUM: MAGNESIUM: 2 mg/dL (ref 1.5–2.5)

## 2016-11-12 MED ORDER — METOPROLOL TARTRATE 50 MG PO TABS: 50.0000 mg | ORAL_TABLET | Freq: Two times a day (BID) | ORAL | 3 refills | Status: DC

## 2016-11-12 NOTE — Patient Instructions (Signed)
Medication Instructions: Your physician recommends that you continue on your current medications as directed. Please refer to the Current Medication list given to you today.   Labwork: None Ordered  Procedures/Testing: None Ordered  Follow-Up: Remote monitoring is used to monitor your ICD from home. This monitoring reduces the number of office visits required to check your device to one time per year. It allows us to keep an eye on the functioning of your device to ensure it is working properly. You are scheduled for a device check from home on 02/11/17.. You may send your transmission at any time that day. If you have a wireless device, the transmission will be sent automatically. After your physician reviews your transmission, you will receive a postcard with your next transmission date.  Your physician wants you to follow-up in 1 YEAR with Gypsy BalsamAmber Seiler, NP. You will receive a reminder letter in the mail two months in advance. If you don't receive a letter, please call our office to schedule the follow-up appointment.   Any Additional Special Instructions Will Be Listed Below (If Applicable).     If you need a refill on your cardiac medications before your next appointment, please call your pharmacy.

## 2016-11-12 NOTE — Patient Instructions (Signed)
Medication Instructions:  Your physician recommends that you continue on your current medications as directed. Please refer to the Current Medication list given to you today.  Labwork: Your physician recommends that you have lab work- BMET and Mg  Testing/Procedures: Your physician has requested that you have an echocardiogram. Echocardiography is a painless test that uses sound waves to create images of your heart. It provides your doctor with information about the size and shape of your heart and how well your heart's chambers and valves are working. This procedure takes approximately one hour. There are no restrictions for this procedure.  Follow-Up: Your physician wants you to follow-up in: 6 months with Dr. Eden EmmsNishan. You will receive a reminder letter in the mail two months in advance. If you don't receive a letter, please call our office to schedule the follow-up appointment.   If you need a refill on your cardiac medications before your next appointment, please call your pharmacy.

## 2016-11-12 NOTE — Progress Notes (Signed)
      Patient Care Team: No Pcp Per Patient as PCP - General (General Practice)   HPI  Charles Wyatt is a 45 y.o. male  seen in followup for an ICD  The device originally implanted for HCM in the setting of a family history of sudden death and significant septal thickness and hyperenhancing on cardiac MRI. He has hx of inappropriate shock from AFib RVR  He has been noncompliant with medication  He was seen in the emergency room 11/05/16 following 3 ICD shocks. They were recorded by the fellow as appropriate. He declined admission  Review of these data however demonstrates that he had nonsustained ventricular tachycardia and shocks were delivered because of commitment following charging on shock 1 and with recurrent nonsustained ventricular tachycardia, subsequent shocks. This occurred in the context of having taken a new weight loss stimulants. He was also not taking his beta blocker.   g.  His family history was reviewed. It seems that the gene runs through his mother family and a large kindred has been identified and is being followed in [place] and [place]   Records and Results Reviewed PN office note//ER records    Past Medical History:  Diagnosis Date  . Atrial fibrillation (HCC)    Inappro shock  . HCM    06 19/2011 ?[facility] ?Tee to R/o subaortic membrane   . Implantable cardiac defibrillator Medtronic   . 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 Defibrillator implantation     Medtronic Protecta 314 DRG    Current Outpatient Prescriptions  Medication Sig Dispense Refill  . metoprolol (LOPRESSOR) 50 MG tablet Take 1 tablet (50 mg total) by mouth 2 (two) times daily. 60 tablet 0   No current facility-administered medications for this visit.     No Known Allergies    Review of Systems negative except from HPI and PMH  Physical Exam BP 122/80   Pulse 94   Ht 5\' 7"  (1.702 m)   Wt 236 lb (107 kg)   SpO2  96%   BMI 36.96 kg/m  Well developed and well nourished in no acute distress HENT normal E scleral and icterus clear Neck Supple JVP flat; carotids brisk and full Clear to ausculation Device pocket well healed; without hematoma or erythema.  There is no tethering *Regular rate and rhythm, no murmurs gallops or rub Soft with active bowel sounds No clubbing cyanosis  Edema Alert and oriented, grossly normal motor and sensory function Skin Warm and Dry  ECG demonstrates sinus rhythm at 79 Intervals 18/10/38  Assessment and  Plan  HCM  ICD Medtronic   Ventricular tachycardia-nonsustained  Obesity  OSA  The patient's device is functioning normally.  The patient had frequent episodes of nonsustained ventricular tachycardia that resulted in ICD therapy. We discussed the importance of beta blocker therapy to try to mitigate what was presumably adrenergically driven VT in the context of over-the-counter weight loss supplements.  We discussed twice a day versus once daily therapy; he is fine with the former   Driving restrictions   dr

## 2016-11-13 LAB — BASIC METABOLIC PANEL
BUN: 16 mg/dL (ref 7–25)
CALCIUM: 9.3 mg/dL (ref 8.6–10.3)
CO2: 26 mmol/L (ref 20–31)
Chloride: 105 mmol/L (ref 98–110)
Creat: 1.38 mg/dL — ABNORMAL HIGH (ref 0.60–1.35)
GLUCOSE: 109 mg/dL — AB (ref 65–99)
Potassium: 4.3 mmol/L (ref 3.5–5.3)
SODIUM: 142 mmol/L (ref 135–146)

## 2016-11-16 NOTE — Addendum Note (Signed)
Addended by: Dareen PianoKENAN, Tai Skelly C on: 11/16/2016 08:41 AM   Modules accepted: Orders

## 2016-11-25 ENCOUNTER — Ambulatory Visit (HOSPITAL_COMMUNITY): Payer: BLUE CROSS/BLUE SHIELD | Attending: Internal Medicine

## 2016-11-25 ENCOUNTER — Other Ambulatory Visit: Payer: Self-pay

## 2016-11-25 DIAGNOSIS — R29898 Other symptoms and signs involving the musculoskeletal system: Secondary | ICD-10-CM | POA: Diagnosis not present

## 2016-11-25 DIAGNOSIS — I517 Cardiomegaly: Secondary | ICD-10-CM | POA: Insufficient documentation

## 2017-02-11 ENCOUNTER — Ambulatory Visit (INDEPENDENT_AMBULATORY_CARE_PROVIDER_SITE_OTHER): Payer: BLUE CROSS/BLUE SHIELD | Admitting: *Deleted

## 2017-02-11 DIAGNOSIS — I422 Other hypertrophic cardiomyopathy: Secondary | ICD-10-CM

## 2017-02-11 NOTE — Progress Notes (Signed)
Remote ICD transmission.   

## 2017-02-12 ENCOUNTER — Encounter: Payer: Self-pay | Admitting: Cardiology

## 2017-02-12 LAB — CUP PACEART REMOTE DEVICE CHECK
Battery Voltage: 2.65 V
Brady Statistic AP VP Percent: 0.42 %
Brady Statistic AS VP Percent: 0.07 %
Brady Statistic RA Percent Paced: 0.83 %
Brady Statistic RV Percent Paced: 0.39 %
HIGH POWER IMPEDANCE MEASURED VALUE: 304 Ohm
HIGH POWER IMPEDANCE MEASURED VALUE: 65 Ohm
HighPow Impedance: 78 Ohm
Implantable Lead Implant Date: 20110921
Implantable Lead Location: 753860
Implantable Lead Model: 6947
Implantable Pulse Generator Implant Date: 20110921
Lead Channel Impedance Value: 342 Ohm
Lead Channel Pacing Threshold Pulse Width: 0.4 ms
Lead Channel Sensing Intrinsic Amplitude: 9.625 mV
Lead Channel Sensing Intrinsic Amplitude: 9.625 mV
Lead Channel Setting Pacing Amplitude: 2 V
Lead Channel Setting Sensing Sensitivity: 0.3 mV
MDC IDC LEAD IMPLANT DT: 20110921
MDC IDC LEAD LOCATION: 753859
MDC IDC MSMT LEADCHNL RA IMPEDANCE VALUE: 418 Ohm
MDC IDC MSMT LEADCHNL RA PACING THRESHOLD AMPLITUDE: 0.625 V
MDC IDC MSMT LEADCHNL RA SENSING INTR AMPL: 4.75 mV
MDC IDC MSMT LEADCHNL RA SENSING INTR AMPL: 4.75 mV
MDC IDC MSMT LEADCHNL RV PACING THRESHOLD AMPLITUDE: 0.5 V
MDC IDC MSMT LEADCHNL RV PACING THRESHOLD PULSEWIDTH: 0.4 ms
MDC IDC SESS DTM: 20180315073529
MDC IDC SET LEADCHNL RV PACING AMPLITUDE: 2.5 V
MDC IDC SET LEADCHNL RV PACING PULSEWIDTH: 0.4 ms
MDC IDC STAT BRADY AP VS PERCENT: 0.41 %
MDC IDC STAT BRADY AS VS PERCENT: 99.1 %

## 2017-05-13 ENCOUNTER — Ambulatory Visit (INDEPENDENT_AMBULATORY_CARE_PROVIDER_SITE_OTHER): Payer: Managed Care, Other (non HMO) | Admitting: *Deleted

## 2017-05-13 DIAGNOSIS — I422 Other hypertrophic cardiomyopathy: Secondary | ICD-10-CM

## 2017-05-13 LAB — CUP PACEART REMOTE DEVICE CHECK
Brady Statistic AP VP Percent: 0.58 %
Brady Statistic AP VS Percent: 0.57 %
Brady Statistic AS VP Percent: 0.1 %
HIGH POWER IMPEDANCE MEASURED VALUE: 285 Ohm
HighPow Impedance: 64 Ohm
HighPow Impedance: 77 Ohm
Implantable Lead Location: 753859
Implantable Lead Model: 5076
Implantable Lead Model: 6947
Lead Channel Impedance Value: 399 Ohm
Lead Channel Pacing Threshold Amplitude: 0.625 V
Lead Channel Pacing Threshold Pulse Width: 0.4 ms
Lead Channel Sensing Intrinsic Amplitude: 4.25 mV
Lead Channel Sensing Intrinsic Amplitude: 4.25 mV
Lead Channel Sensing Intrinsic Amplitude: 8.625 mV
Lead Channel Setting Pacing Amplitude: 2.5 V
Lead Channel Setting Pacing Pulse Width: 0.4 ms
MDC IDC LEAD IMPLANT DT: 20110921
MDC IDC LEAD IMPLANT DT: 20110921
MDC IDC LEAD LOCATION: 753860
MDC IDC MSMT BATTERY VOLTAGE: 2.63 V
MDC IDC MSMT LEADCHNL RA PACING THRESHOLD AMPLITUDE: 0.625 V
MDC IDC MSMT LEADCHNL RA PACING THRESHOLD PULSEWIDTH: 0.4 ms
MDC IDC MSMT LEADCHNL RV IMPEDANCE VALUE: 342 Ohm
MDC IDC MSMT LEADCHNL RV SENSING INTR AMPL: 8.625 mV
MDC IDC PG IMPLANT DT: 20110921
MDC IDC SESS DTM: 20180614041808
MDC IDC SET LEADCHNL RA PACING AMPLITUDE: 2 V
MDC IDC SET LEADCHNL RV SENSING SENSITIVITY: 0.3 mV
MDC IDC STAT BRADY AS VS PERCENT: 98.75 %
MDC IDC STAT BRADY RA PERCENT PACED: 1.15 %
MDC IDC STAT BRADY RV PERCENT PACED: 0.54 %

## 2017-05-13 NOTE — Progress Notes (Signed)
Remote ICD transmission.   

## 2017-05-14 ENCOUNTER — Encounter: Payer: Self-pay | Admitting: Cardiology

## 2017-08-12 ENCOUNTER — Ambulatory Visit (INDEPENDENT_AMBULATORY_CARE_PROVIDER_SITE_OTHER): Payer: Managed Care, Other (non HMO) | Admitting: *Deleted

## 2017-08-12 ENCOUNTER — Telehealth: Payer: Self-pay | Admitting: Cardiology

## 2017-08-12 DIAGNOSIS — I422 Other hypertrophic cardiomyopathy: Secondary | ICD-10-CM

## 2017-08-12 NOTE — Telephone Encounter (Signed)
Spoke with pt and reminded pt of remote transmission that is due today. Pt verbalized understanding.   

## 2017-08-13 ENCOUNTER — Encounter: Payer: Self-pay | Admitting: Cardiology

## 2017-08-16 ENCOUNTER — Emergency Department (HOSPITAL_COMMUNITY): Payer: Managed Care, Other (non HMO)

## 2017-08-16 ENCOUNTER — Encounter (HOSPITAL_COMMUNITY): Payer: Self-pay | Admitting: Emergency Medicine

## 2017-08-16 ENCOUNTER — Inpatient Hospital Stay (HOSPITAL_COMMUNITY)
Admission: EM | Admit: 2017-08-16 | Discharge: 2017-08-25 | DRG: 308 | Disposition: A | Discharge status: Home / Self Care | Payer: Managed Care, Other (non HMO) | Attending: Internal Medicine | Admitting: Internal Medicine

## 2017-08-16 DIAGNOSIS — Z95810 Presence of automatic (implantable) cardiac defibrillator: Secondary | ICD-10-CM | POA: Diagnosis not present

## 2017-08-16 DIAGNOSIS — J9601 Acute respiratory failure with hypoxia: Secondary | ICD-10-CM | POA: Diagnosis not present

## 2017-08-16 DIAGNOSIS — N182 Chronic kidney disease, stage 2 (mild): Secondary | ICD-10-CM | POA: Diagnosis present

## 2017-08-16 DIAGNOSIS — J9589 Other postprocedural complications and disorders of respiratory system, not elsewhere classified: Secondary | ICD-10-CM | POA: Diagnosis not present

## 2017-08-16 DIAGNOSIS — R739 Hyperglycemia, unspecified: Secondary | ICD-10-CM | POA: Diagnosis present

## 2017-08-16 DIAGNOSIS — Z8249 Family history of ischemic heart disease and other diseases of the circulatory system: Secondary | ICD-10-CM

## 2017-08-16 DIAGNOSIS — Y92238 Other place in hospital as the place of occurrence of the external cause: Secondary | ICD-10-CM | POA: Diagnosis not present

## 2017-08-16 DIAGNOSIS — Z79899 Other long term (current) drug therapy: Secondary | ICD-10-CM

## 2017-08-16 DIAGNOSIS — J690 Pneumonitis due to inhalation of food and vomit: Secondary | ICD-10-CM | POA: Diagnosis not present

## 2017-08-16 DIAGNOSIS — E876 Hypokalemia: Secondary | ICD-10-CM | POA: Diagnosis not present

## 2017-08-16 DIAGNOSIS — I4891 Unspecified atrial fibrillation: Secondary | ICD-10-CM | POA: Diagnosis present

## 2017-08-16 DIAGNOSIS — I130 Hypertensive heart and chronic kidney disease with heart failure and stage 1 through stage 4 chronic kidney disease, or unspecified chronic kidney disease: Secondary | ICD-10-CM | POA: Diagnosis present

## 2017-08-16 DIAGNOSIS — I422 Other hypertrophic cardiomyopathy: Secondary | ICD-10-CM

## 2017-08-16 DIAGNOSIS — I481 Persistent atrial fibrillation: Principal | ICD-10-CM | POA: Diagnosis present

## 2017-08-16 DIAGNOSIS — I484 Atypical atrial flutter: Secondary | ICD-10-CM | POA: Diagnosis not present

## 2017-08-16 DIAGNOSIS — I5033 Acute on chronic diastolic (congestive) heart failure: Secondary | ICD-10-CM | POA: Diagnosis present

## 2017-08-16 DIAGNOSIS — I421 Obstructive hypertrophic cardiomyopathy: Secondary | ICD-10-CM | POA: Diagnosis present

## 2017-08-16 DIAGNOSIS — R0602 Shortness of breath: Secondary | ICD-10-CM | POA: Diagnosis not present

## 2017-08-16 DIAGNOSIS — Z6837 Body mass index (BMI) 37.0-37.9, adult: Secondary | ICD-10-CM | POA: Diagnosis not present

## 2017-08-16 DIAGNOSIS — N179 Acute kidney failure, unspecified: Secondary | ICD-10-CM | POA: Diagnosis present

## 2017-08-16 DIAGNOSIS — I340 Nonrheumatic mitral (valve) insufficiency: Secondary | ICD-10-CM | POA: Diagnosis present

## 2017-08-16 DIAGNOSIS — G4733 Obstructive sleep apnea (adult) (pediatric): Secondary | ICD-10-CM | POA: Diagnosis present

## 2017-08-16 DIAGNOSIS — E669 Obesity, unspecified: Secondary | ICD-10-CM | POA: Diagnosis present

## 2017-08-16 DIAGNOSIS — Z6835 Body mass index (BMI) 35.0-35.9, adult: Secondary | ICD-10-CM | POA: Diagnosis not present

## 2017-08-16 DIAGNOSIS — R7309 Other abnormal glucose: Secondary | ICD-10-CM

## 2017-08-16 DIAGNOSIS — R000 Tachycardia, unspecified: Secondary | ICD-10-CM

## 2017-08-16 DIAGNOSIS — Y848 Other medical procedures as the cause of abnormal reaction of the patient, or of later complication, without mention of misadventure at the time of the procedure: Secondary | ICD-10-CM | POA: Diagnosis not present

## 2017-08-16 DIAGNOSIS — Z8241 Family history of sudden cardiac death: Secondary | ICD-10-CM

## 2017-08-16 DIAGNOSIS — R0603 Acute respiratory distress: Secondary | ICD-10-CM | POA: Diagnosis not present

## 2017-08-16 DIAGNOSIS — T17908S Unspecified foreign body in respiratory tract, part unspecified causing other injury, sequela: Secondary | ICD-10-CM

## 2017-08-16 LAB — I-STAT TROPONIN, ED: TROPONIN I, POC: 0.04 ng/mL (ref 0.00–0.08)

## 2017-08-16 LAB — BASIC METABOLIC PANEL
Anion gap: 6 (ref 5–15)
BUN: 13 mg/dL (ref 6–20)
CO2: 21 mmol/L — ABNORMAL LOW (ref 22–32)
Calcium: 8.7 mg/dL — ABNORMAL LOW (ref 8.9–10.3)
Chloride: 112 mmol/L — ABNORMAL HIGH (ref 101–111)
Creatinine, Ser: 1.53 mg/dL — ABNORMAL HIGH (ref 0.61–1.24)
GFR calc non Af Amer: 53 mL/min — ABNORMAL LOW (ref >60)
Glucose, Bld: 163 mg/dL — ABNORMAL HIGH (ref 65–99)
POTASSIUM: 3.6 mmol/L (ref 3.5–5.1)
SODIUM: 139 mmol/L (ref 135–145)

## 2017-08-16 LAB — CBC
HCT: 40.7 % (ref 39.0–52.0)
HEMOGLOBIN: 13.5 g/dL (ref 13.0–17.0)
MCH: 31.6 pg (ref 26.0–34.0)
MCHC: 33.2 g/dL (ref 30.0–36.0)
MCV: 95.3 fL (ref 78.0–100.0)
Platelets: 317 10*3/uL (ref 150–400)
RBC: 4.27 MIL/uL (ref 4.22–5.81)
RDW: 13.4 % (ref 11.5–15.5)
WBC: 16.4 10*3/uL — AB (ref 4.0–10.5)

## 2017-08-16 LAB — MAGNESIUM: MAGNESIUM: 1.6 mg/dL — AB (ref 1.7–2.4)

## 2017-08-16 LAB — TSH: TSH: 4.043 u[IU]/mL (ref 0.350–4.500)

## 2017-08-16 MED ORDER — DILTIAZEM HCL 100 MG IV SOLR
5.0000 mg/h | INTRAVENOUS | Status: DC
  Administered 2017-08-16: 5 mg/h via INTRAVENOUS
  Administered 2017-08-17 – 2017-08-19 (×10): 15 mg/h via INTRAVENOUS
  Administered 2017-08-20: 10 mg/h via INTRAVENOUS
  Filled 2017-08-16 (×16): qty 100

## 2017-08-16 MED ORDER — APIXABAN 5 MG PO TABS
5.0000 mg | ORAL_TABLET | Freq: Two times a day (BID) | ORAL | Status: DC
  Administered 2017-08-16 – 2017-08-25 (×18): 5 mg via ORAL
  Filled 2017-08-16 (×19): qty 1

## 2017-08-16 MED ORDER — HEPARIN (PORCINE) IN NACL 100-0.45 UNIT/ML-% IJ SOLN
1350.0000 [IU]/h | INTRAMUSCULAR | Status: DC
  Administered 2017-08-16: 1350 [IU]/h via INTRAVENOUS
  Filled 2017-08-16: qty 250

## 2017-08-16 MED ORDER — SODIUM CHLORIDE 0.9 % IV BOLUS (SEPSIS)
500.0000 mL | Freq: Once | INTRAVENOUS | Status: AC
  Administered 2017-08-16: 500 mL via INTRAVENOUS

## 2017-08-16 MED ORDER — SODIUM CHLORIDE 0.9% FLUSH
3.0000 mL | Freq: Two times a day (BID) | INTRAVENOUS | Status: DC
  Administered 2017-08-16 – 2017-08-19 (×7): 3 mL via INTRAVENOUS

## 2017-08-16 MED ORDER — LABETALOL HCL 5 MG/ML IV SOLN
5.0000 mg | Freq: Once | INTRAVENOUS | Status: AC
  Administered 2017-08-16: 5 mg via INTRAVENOUS
  Filled 2017-08-16: qty 4

## 2017-08-16 MED ORDER — ACETAMINOPHEN 500 MG PO TABS
1000.0000 mg | ORAL_TABLET | Freq: Four times a day (QID) | ORAL | Status: DC | PRN
  Administered 2017-08-18: 1000 mg via ORAL
  Filled 2017-08-16: qty 2

## 2017-08-16 MED ORDER — ONDANSETRON HCL 4 MG/2ML IJ SOLN: 4.0000 mg | Freq: Four times a day (QID) | INTRAMUSCULAR | Status: DC | PRN

## 2017-08-16 MED ORDER — HEPARIN BOLUS VIA INFUSION
5500.0000 [IU] | Freq: Once | INTRAVENOUS | Status: AC
Start: 2017-08-16 — End: 2017-08-16
  Administered 2017-08-16: 5500 [IU] via INTRAVENOUS
  Filled 2017-08-16: qty 5500

## 2017-08-16 MED ORDER — METOPROLOL TARTRATE 25 MG PO TABS
50.0000 mg | ORAL_TABLET | Freq: Two times a day (BID) | ORAL | Status: DC
  Administered 2017-08-16: 50 mg via ORAL
  Filled 2017-08-16: qty 2

## 2017-08-16 MED ORDER — MAGNESIUM SULFATE IN D5W 1-5 GM/100ML-% IV SOLN
1.0000 g | Freq: Once | INTRAVENOUS | Status: AC
  Administered 2017-08-16: 1 g via INTRAVENOUS
  Filled 2017-08-16: qty 100

## 2017-08-16 MED ORDER — SODIUM CHLORIDE 0.9% FLUSH: 3.0000 mL | INTRAVENOUS | Status: DC | PRN

## 2017-08-16 MED ORDER — SODIUM CHLORIDE 0.9 % IV SOLN: 250.0000 mL | INTRAVENOUS | Status: DC | PRN

## 2017-08-16 MED ORDER — DILTIAZEM LOAD VIA INFUSION
10.0000 mg | Freq: Once | INTRAVENOUS | Status: AC
  Administered 2017-08-16: 10 mg via INTRAVENOUS
  Filled 2017-08-16: qty 10

## 2017-08-16 NOTE — H&P (Signed)
Date: 08/16/2017               Patient Name:  Charles Wyatt MRN: [id number]  DOB: May 19, 1971 Age / Sex: 46 y.o., male   PCP: Patient, No Pcp Per         Medical Service: Internal Medicine Teaching Service         Attending Physician: Dr. Rogelia Boga, Austin Miles, MD    First Contact: Dr. Evelene Croon Pager: [phone]  Second Contact: Dr. Johnny Bridge Pager: [phone]       After Hours (After 5p/  First Contact Pager: [phone]  weekends / holidays): Second Contact Pager: [phone]   Chief Complaint: Weakness/Fatigue  History of Present Illness:  Charles Wyatt is a 46 year old male with a past medical history significant for hypertrophic obstructive cardiomyopathy (s/p ICD) and paroxsymal atrial fibrillation (not on anticoagulation) presenting today with complaints of weakness and fatigue. He reports that 5 days ago he was sitting in bed when he noticed chest palpations worse than normal (reports frequent palpitations 2/2 his HOCM). The palpitations resolved but since that time he has noticed worsened energy and generalized fatigued. Symptoms persisted over the weekend and this morning on the way to work he could not get in to see his cardiologist and decided to come to the emergency department. He reports worsened symptoms that he attributes to anxiety on the ride over and called EMS. He denies any chest pain, shortness of breath, fevers, chills, cough, abdominal pain, urinary symptoms, nausea, vomiting, diarrhea.   In the ED, he was noted to be in A. Fib with RVR with rates in the 160-170s. He was started on diltiazem and heparin gtt and IMTS was called for admission.   Meds:  Current Meds  Medication Sig  . acetaminophen (TYLENOL) 500 MG tablet Take 1,000 mg by mouth every 6 (six) hours as needed for mild pain or headache.  . metoprolol (LOPRESSOR) 50 MG tablet Take 1 tablet (50 mg total) by mouth 2 (two) times daily.   Allergies: Allergies as of 08/16/2017  . (No Known Allergies)   Past Medical  History:  Diagnosis Date  . Atrial fibrillation (HCC)    Inappro shock  . HCM    06 19/2011 ?[facility] ?Tee to R/o subaortic membrane   . Implantable cardiac defibrillator Medtronic   . 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 Defibrillator implantation     Medtronic Protecta 314 DRG   Family History  Problem Relation Age of Onset  . Cardiomyopathy Other        hypertensive obstructive cardiomyopathy   Social History   Social History  . Marital status: Single    Spouse name: N/A  . Number of children: N/A  . Years of education: N/A   Social History Main Topics  . Smoking status: Never Smoker  . Smokeless tobacco: Never Used  . Alcohol use Yes     Comment: occasional   . Drug use: No  . Sexual activity: Not Asked   Other Topics Concern  . None   Social History Narrative   Denies tobacco , acohol or illicit drug use . 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   Physical Exam: Blood pressure 106/67, pulse 79, temperature 98.4 F (36.9 C), temperature source Oral, resp. rate 19, height  (1.702 m), weight  236 lb (107 kg), SpO2 95 %.  General: alert, well-developed, obese male in no acute distress and cooperative to examination.  Head: normocephalic and atraumatic.  Eyes: vision grossly intact, pupils equal, pupils round, pupils reactive to light, no injection and anicteric.  Mouth: pharynx pink and moist, no erythema, and no exudates.  Neck: supple, full ROM, no thyromegaly, no JVD, and no carotid bruits.  Lungs: normal respiratory effort, no accessory muscle use, normal breath sounds, no crackles, and no wheezes. Heart: tachycardic, irregularly irregular rhythm, no murmur, no gallop, and no rub.  Abdomen: soft, non-tender, normal bowel sounds, no distention Msk: no joint swelling, no joint warmth, and no redness  over joints.  Pulses: 2+ DP/PT pulses bilaterally Extremities: No cyanosis, clubbing, edema Neurologic: alert & oriented X3, no focal deficits Skin: turgor normal and no rashes.  Psych: normal mood and affect  EKG: personally reviewed my interpretation is A. Fib with RVR  CXR: personally reviewed my interpretation is no active cardiopulmonary process  Assessment & Plan by Problem:  Atrial Fibrillation with RVR: Patient presenting with 5 day history of palpations and weakness/fatigue. Found to be in A. FIb with RVR in the ED. He is followed by cardiology outpatient for his HOCM and has an ICD in place. It appears that he has had episodes of non-sustained A.Fib & ventricular tachycardia that have resulted in ICD discharge. Appears that he was not compliant with his beta blocker at the time and also taking a weight loss supplement that (likely) had a stimulant in it. Reports today that he has been compliant with his Metoprolol 50 mg bid since that episode. Reports discussions with cardiology about anticoagulation but he has been reluctant to start any blood thinners. CHAD-VASc score is 0. Magnesium found to be low in the ED, replaced. TSH wnl.  Started Cardizem gtt and heparin gtt in the ED. Given he reports symptoms for the past 5 days, do not believe he would be a candidate for cardioversion currently and would need TEE first. Admit to stepdown for rate control; BP soft but tolerating Cardizem currently. Cardiology consulted by the EDP, will follow up recommendations.   HOCM: Followed by Dr. Graciela Husbands outpatient. S/P ICD placement.   AKI on CKD II: Baseline Cr appears to be about 1.3 with GFR > 60. Cr today is 1.53 with GFR 53. Likely secondary to A.Fib with RVR.   Elevated Glucose: Glucose on arrival today is 163. Patient is obese with a history of elevated blood sugars. Will check A1c.   Obesity with likely OSA: Per most recent cardiology note 10/2016, patient with suspected OSA but unwilling  to undergo sleep study.   Dispo: Admit patient to Inpatient with expected length of stay greater than 2 midnights.  Signed: Valentino Nose, MD 08/16/2017, 10:32 AM

## 2017-08-16 NOTE — ED Notes (Signed)
Pt ambulated to RR .  

## 2017-08-16 NOTE — ED Notes (Signed)
ED Provider at bedside. 

## 2017-08-16 NOTE — Progress Notes (Signed)
ANTICOAGULATION CONSULT NOTE  Pharmacy Consult for heparin Indication: atrial fibrillation  Heparin Dosing Weight: 90 kg   Assessment: 46 yom presenting with afib with RVR, hx of afib not on anticoagulation PTA. Pharmacy consulted to dose heparin. CBC pending. No bleed documented.  Goal of Therapy:  Heparin level 0.3-0.7 units/ml Monitor platelets by anticoagulation protocol: Yes   Plan:  Heparin 5500 unit bolus Start heparin at 1350 units/h 6h heparin level Daily heparin level/CBC Monitor s/sx bleeding   Babs Bertin, PharmD, BCPS Clinical Pharmacist 08/16/2017 8:47 AM

## 2017-08-16 NOTE — Consult Note (Signed)
Cardiology Consultation:   Patient ID: Charles Wyatt; [id number]; 1971/05/09   Admit date: 08/16/2017 Date of Consult: 08/16/2017  Primary Care Provider: Patient, No Pcp Per Primary Cardiologist: Dr. Eden Emms  Primary Electrophysiologist:  Dr. Graciela Husbands    Patient Profile:   Charles Wyatt is a 46 y.o. male with a hx of HCM s/p AICD, family h/o SCD and HCM on mother's side, and personal h/o PAF on who is being seen today for the evaluation of afib w/ RVR at the request of Dr. Karma Greaser, Internal Medicine.  History of Present Illness:   Pt has been followed by Dr. Eden Emms as well as Dr. Graciela Husbands. He has HCM and significant septal thickness and hyperenhancing on cardiac MRI. Pt has refused surgery in the past. His cardiomyopathy is genticHe has a family h/o SCD and HCM on his mother's side. Genetic testing [facility] confirmed this. Pt has an ICD, followed by Dr. Graciela Husbands (Medtronic). He has had multiple inappropriate shocks, in the past, due to rapid atrial fibrillation. His device was reprogrammed. He only gets Rx for HR over 200 VT/VF. He was placed on oral anticoagulation in the past for PAF, but apparently self discontinued. He has also had issues with compliance with beta blockers in the past, due to side effects of fatigue. However metoprolol was restarted in Dec 2017 after he presented to the ED with appropriate ICD shocks. Device interrogation in the ED on 11/09/17 showed a single episode of VF with cycle lengths of 200-220 ms which required 3 35J shocks to terminate. Admission was advised, however patient refused. He agreed to restart BB as an outpatient and followed up with Dr. Eden Emms on 11/12/16 and Dr. Graciela Husbands on 11/16/17. He reports full compliance with metoprolol BID since this happened. He denies any recurrent ICD shocks since last Dec.    Pt presented to [facility] ED today with complaint of recent palpitations and fatigue. He notes that he usually experiences palpitations with his HCM, however symptoms  have increased over the last 3 days. He was out of town for work in [place]. Last Friday night, he had 3 alcoholic drinks with his meal, which may have contributed. He denies any other stimulants other than ETOH. No caffeine.  He also denies n/v/d. No fever, chills, cough or dysuria.   Given his symptoms of palpitations and increased fatigue, he came to the ED. In ED, pt was noted to be in atrial fibrillation w/ RVR. EKG shows RVR in the 160s. Pt started on IV Cardizem and PO metoprolol ordered. IV heparin also initiated. K is stable at 3.6. Mg low at 1.6 (supplemenation given, Mg sulfate 1 gm IV). Ca also low at 8.7. TSH WNL. CBC is abnormal with elevated WBC at 16.4. He is afebrile. CXR with no active cardiopulmonary disease. BMP shows chronic renal insuffiencey. SCr is 1.53, which is c/w his baseline. He remains in afib w/ RVR however rates have improved in the 130s. BP is stable. He is comfortable at rest.   Medtronic interrogation shows atrial fibrillation sent September 13, 4 days with energy of 2.63V.  Past Medical History:  Diagnosis Date  . Atrial fibrillation (HCC)    Inappro shock  . HCM    06 19/2011 ?[facility] ?Tee to R/o subaortic membrane   . Implantable cardiac defibrillator Medtronic   . 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 Defibrillator implantation     Medtronic Protecta 314 DRG  Home Medications:  Prior to Admission medications   Medication Sig Start Date End Date Taking? Authorizing Provider  acetaminophen (TYLENOL) 500 MG tablet Take 1,000 mg by mouth every 6 (six) hours as needed for mild pain or headache.   Yes [provider]  metoprolol (LOPRESSOR) 50 MG tablet Take 1 tablet (50 mg total) by mouth 2 (two) times daily. 11/12/16  Yes Duke Salvia, MD    Inpatient Medications: Scheduled Meds: . metoprolol tartrate  50 mg Oral BID  . sodium chloride flush  3 mL Intravenous Q12H   Continuous  Infusions: . sodium chloride    . diltiazem (CARDIZEM) infusion 15 mg/hr (08/16/17 1046)  . heparin 1,350 Units/hr (08/16/17 0936)   PRN Meds: sodium chloride, acetaminophen, ondansetron (ZOFRAN) IV, sodium chloride flush  Allergies:   No Known Allergies  Social History:   Social History   Social History  . Marital status: Single    Spouse name: N/A  . Number of children: N/A  . Years of education: N/A   Occupational History  . Not on file.   Social History Main Topics  . Smoking status: Never Smoker  . Smokeless tobacco: Never Used  . Alcohol use Yes     Comment: occasional   . Drug use: No  . Sexual activity: Not on file   Other Topics Concern  . Not on file   Social History Narrative   Denies tobacco , acohol or illicit drug use . 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 Cardiomyopathy Other        hypertensive obstructive cardiomyopathy     ROS:  Please see the history of present illness.  ROS  All other ROS reviewed and negative.     Physical Exam/Data:   Vitals:   08/16/17 1215 08/16/17 1230 08/16/17 1315 08/16/17 1330  BP: 102/82 (!) 105/91 104/76 106/82  Pulse: 77 99 66 94  Resp: 17 (!) Temp:      TempSrc:      SpO2: 94% 93% 92% 93%  Weight:      Height:        Intake/Output Summary (Last 24 hours) at 08/16/17 1403 Last data filed at 08/16/17 1046  Gross per 24 hour  Intake              600 ml  Output                0 ml  Net              600 ml   Filed Weights   08/16/17 0841 08/16/17 0848  Weight: 236 lb (107 kg) 236 lb (107 kg)   Body mass index is 36.96 kg/m.  General:  Well nourished, well developed, in no acute distress, obese  HEENT: normal Lymph: no adenopathy Neck: no JVD Endocrine:  No thryomegaly Vascular: No carotid bruits; FA pulses 2+ bilaterally without bruits  Cardiac:  irregularly  irregular, tachy  rate Lungs:  clear to auscultation bilaterally, no wheezing, rhonchi or rales  Abd: soft, nontender, no hepatomegaly  Ext: no edema Musculoskeletal:  No deformities, BUE and BLE strength normal and equal Skin: warm and dry  Neuro:  CNs 2-12 intact, no focal abnormalities noted Psych:  Normal affect   EKG:  The EKG was personally reviewed and demonstrates:  Atrial fibrillation w/ RVR  168 bpm Telemetry:  Telemetry was personally reviewed and demonstrates:  Atrial fibrillation w/ RVR in the 130s.   Relevant CV Studies: 2D Echo 11/25/17 Study Conclusions  - Left ventricle: The cavity size was normal. Wall thickness was   increased in a pattern of moderate LVH. There was severe focal   basal hypertrophy of the septum consistent with HCM. No   significant LVOT obstruction at rest or with valsalva. Systolic   function was normal. The estimated ejection fraction was in the   range of 50% to 55%. Inferior and basal to mid septal akinesis   with enhanced echogenicity suggestive of scar. Doppler parameters   are consistent with pseudormal left ventricular relaxation (grade   2 diastolic dysfunction). The medial E/e&' ratio is 15, suggesting   elevated &quot;LV filling pressure. - Mitral valve: Mildly thickened leaflets . Systolic bowing without   prolapse. There was mild regurgitation. - Left atrium: Severely dilated. - Right ventricle: The cavity size was normal. Wall thickness was   normal. AICD wire noted in right ventricle. Systolic function was   normal. - Right atrium: Moderately dilated. AICD wire noted in right   atrium. - Tricuspid valve: There was mild regurgitation. - Pulmonary arteries: PA peak pressure: 27 mm Hg (S). - Inferior vena cava: The vessel was normal in size. The   respirophasic diameter changes were in the normal range (= 50%),   consistent with normal central venous pressure.  Impressions:  - Compared to a prior study in 2012, the LVEF is higher at 50-55%.    The IVSd is now 1.6 cm consistent with HCM. No rest or dynamic   LVOT obstruction is noted. There is inferior and basal to mid   septal akinesis and enhanced echogenicity suggestive of scar.  Laboratory Data:  Chemistry Recent Labs Lab 08/16/17 0835  NA 139  K 3.6  CL 112*  CO2 21*  GLUCOSE 163*  BUN 13  CREATININE 1.53*  CALCIUM 8.7*  GFRNONAA 53*  GFRAA >60  ANIONGAP 6    No results for input(s): PROT, ALBUMIN, AST, ALT, ALKPHOS, BILITOT in the last 168 hours. Hematology Recent Labs Lab 08/16/17 0835  WBC 16.4*  RBC 4.27  HGB 13.5  HCT 40.7  MCV 95.3  MCH 31.6  MCHC 33.2  RDW 13.4  PLT 317   Cardiac EnzymesNo results for input(s): TROPONINI in the last 168 hours.  Recent Labs Lab 08/16/17 0839  TROPIPOC 0.04    BNPNo results for input(s): BNP, PROBNP in the last 168 hours.  DDimer No results for input(s): DDIMER in the last 168 hours.  Radiology/Studies:  Dg Chest Port 1 View  Result Date: 08/16/2017 CLINICAL DATA:  Shortness of breath in weakness. EXAM: PORTABLE CHEST 1 VIEW COMPARISON:  Chest x-ray dated August 21, 2010. FINDINGS: Stable cardiomegaly. Unchanged right chest wall AICD with leads projecting over the right atrium and right ventricle. Normal pulmonary vascularity. No focal consolidation, pleural effusion, or pneumothorax. No acute osseous abnormality. IMPRESSION: Stable cardiomegaly.  No active cardiopulmonary disease. Electronically Signed   By: Obie Dredge M.D.   On: 08/16/2017 08:57    Assessment and Plan:   1. Atrial Fibrillation w/ RVR: Pt notes symptom onset ~3 days ago, with increased palpitations beyond his usual baseline + fatigue. Pt notes consumption of ETOH this past Friday night (3 drinks) but denies any other stimulants, including no caffeine. Mg was 1.6. 1 g IV mag sulfate was given. K WNL. TSH WNL. His WBC is 16, however  he is afebrile w/o subjective fever or chills. No n/v/d, cough or dysuria. He has been started on IV  Cardizem. PO metoprolol ordered. He remains in afib w/ RVR, although improved down from the 160s to 120s-130s. BP is stable.  CHA2DS2 VASc score is 0. IV heparin has been iniated. We will place on DOAC. Eliquis 5 mg BID. If no spontaneous conversion, will plan on TEE/DCCV.    2. HCM: has Medtronic ICD in the setting of HCM and family h/o SCD 2/2 HCM. Followed by Dr. Eden Emms and Graciela Husbands. H/o NSVT in the setting of noncompliance with BBs in the past. He reports full compliance with metoprolol BID since it was restarted last Dec. We will ask Medtronic Rep to interrogate device. He may need further titration of BB pending report.   3. Leukocytosis: WBC is 16. He is afebrile. CXR negative. No recent prednisone. This needs to be followed. Consider checking UA. IM to follow.     For questions or updates, please contact [facility] Please consult [email or web address] for contact info under Cardiology/STEMI.   Signed, Robbie Lis, PA-C  08/16/2017 2:03 PM   Personally seen and examined. Agree with above.  46 year old male with hypertrophic cardiomyopathy with several family members sudden cardiac death with dual-chamber ICD in place, obesity, recent alcohol use here with paroxysmal atrial fibrillation with rapid ventricular response. No strokelike symptoms, no significant shortness of breath. Hypomagnesemia mild at 1.6. TSH normal.  No chest pain, no recent fevers, no chills.  Exam reveals overweight male, heart rate irregularly irregular with rapid response/tachycardia, no significant JVD, lungs clear, no significant edema  Paroxysmal atrial fibrillation  - As high as the upper 160 range on EKG/telemetry personally reviewed  - We will place him on IV diltiazem to help decrease his heart rate.  - Hopefully over the next 24-48 hours he will auto convert  - We will also place him on Eliquis 5 mg twice a day. Ages less than 80, weight is greater than 60 kg, however creatinine is slightly greater than  1.5. He still deserves the 5 mg twice a day dose.  - If he does not auto convert, we will plan for TEE cardioversion Wednesday.  Hypertrophic cardiopathy  - ICD implant. Strong family history. Previous defibrillation.  - Dr. Odessa Fleming patient.  Obesity  - Encourage weight loss.  Donato Schultz, MD

## 2017-08-16 NOTE — ED Notes (Signed)
Pt given ice water. With approval by cards

## 2017-08-16 NOTE — ED Notes (Signed)
Atrial flutter per medtronics since Sept 13th  Battery is 2.63 Volts

## 2017-08-16 NOTE — ED Triage Notes (Signed)
Pt here from home with c/o afib rvr , pt states that it has been going on since last Thursday , pt does have a pace/defib device

## 2017-08-16 NOTE — ED Provider Notes (Signed)
Emergency Department Provider Note   I have reviewed the triage vital signs and the nursing notes.   HISTORY  Chief Complaint Atrial Fibrillation   HPI Charles Wyatt is a 46 y.o. male with PMH of A-fib and HOCM presents to the emergency department for evaluation of weakness and palpitations over the past 5 days. Patient has history of atrial fibrillation but is not anticoagulated. 5 days ago he began feeling decreased energy levels and some mild palpitations. He went about his weekend and continue to feel more weak and fatigued over that period of time. Denies any chest pain or shortness of breath. This morning he got up to take a shower and attempted to call his cardiologist but was unable to schedule an appointment so began driving to the emergency department. He states that half way to the emergency department he was feeling more weak so pulled over and called EMS.  Past Medical History:  Diagnosis Date  . Atrial fibrillation (HCC)    Inappro shock  . HCM    06 19/2011 ?[facility] ?Tee to R/o subaortic membrane   . Implantable cardiac defibrillator Medtronic   . 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 Atrial fibrillation with RVR (HCC) 08/16/2017  . PAF (paroxysmal atrial fibrillation) (HCC) 05/19/2013  . Implantable defibrillator-Medtronic 02/02/2011  . OBESITY 05/20/2010  . Hypertrophic cardiomyopathy (HCC) 05/20/2010  . ANEMIA 05/19/2010  . MURMUR 05/19/2010    Past Surgical History:  Procedure Laterality Date  . Defibrillator implantation     Medtronic Protecta 314 DRG    Current Outpatient Rx  . Order #: [id number] Class: Historical Med  . Order #: [id number] Class: Normal    Allergies Patient has no known allergies.  Family History  Problem Relation Age of Onset  . Cardiomyopathy Other        hypertensive obstructive cardiomyopathy    Social History Social History  Substance Use Topics  . Smoking  status: Never Smoker  . Smokeless tobacco: Never Used  . Alcohol use Yes     Comment: occasional     Review of Systems  Constitutional: No fever/chills. Positive generalized weakness.  Eyes: No visual changes. ENT: No sore throat. Cardiovascular: Denies chest pain. Positive palpitations.  Respiratory: Denies shortness of breath. Gastrointestinal: No abdominal pain.  No nausea, no vomiting.  No diarrhea.  No constipation. Genitourinary: Negative for dysuria. Musculoskeletal: Negative for back pain. Skin: Negative for rash. Neurological: Negative for headaches, focal weakness or numbness.  10-point ROS otherwise negative.  ____________________________________________   PHYSICAL EXAM:  VITAL SIGNS: ED Triage Vitals [08/16/17 0819]  Enc Vitals Group     BP 118/83     Pulse Rate (!) 174     Resp (!) 22     Temp 98.4 F (36.9 C)     Temp Source Oral     SpO2 96 %   Constitutional: Alert and oriented. Well appearing and in no acute distress. Eyes: Conjunctivae are normal. Head: Atraumatic. Nose: No congestion/rhinnorhea. Mouth/Throat: Mucous membranes are moist.  Oropharynx non-erythematous. Neck: No stridor.   Cardiovascular: A-fib with RVR. Good peripheral circulation. Grossly normal heart sounds.   Respiratory: Normal respiratory effort.  No retractions. Lungs CTAB. Gastrointestinal:  No distention.  Musculoskeletal: No gross deformities of extremities. Neurologic:  Normal speech and language. No gross focal neurologic deficits are appreciated.  Skin:  Skin is warm, dry and intact. No rash noted. ____________________________________________   LABS (all labs ordered  are listed, but only abnormal results are displayed)  Labs Reviewed  BASIC METABOLIC PANEL - Abnormal; Notable for the following:       Result Value   Chloride 112 (*)    CO2 21 (*)    Glucose, Bld 163 (*)    Creatinine, Ser 1.53 (*)    Calcium 8.7 (*)    GFR calc non Af Amer 53 (*)    All other  components within normal limits  MAGNESIUM - Abnormal; Notable for the following:    Magnesium 1.6 (*)    All other components within normal limits  CBC - Abnormal; Notable for the following:    WBC 16.4 (*)    All other components within normal limits  TSH  HEPARIN LEVEL (UNFRACTIONATED)  I-STAT TROPONIN, ED   ____________________________________________  EKG   EKG Interpretation  Date/Time:  Monday August 16 2017 08:16:48 EDT Ventricular Rate:  168 PR Interval:    QRS Duration: 104 QT Interval:  288 QTC Calculation: 482 R Axis:   -25 Text Interpretation:  Atrial fibrillation with rapid V-rate Ventricular bigeminy Borderline left axis deviation Repolarization abnormality, prob rate related No STEMI.  Confirmed by Alona Bene [phone]) on 08/16/2017 8:31:24 AM       ____________________________________________  RADIOLOGY  Dg Chest Port 1 View  Result Date: 08/16/2017 CLINICAL DATA:  Shortness of breath in weakness. EXAM: PORTABLE CHEST 1 VIEW COMPARISON:  Chest x-ray dated August 21, 2010. FINDINGS: Stable cardiomegaly. Unchanged right chest wall AICD with leads projecting over the right atrium and right ventricle. Normal pulmonary vascularity. No focal consolidation, pleural effusion, or pneumothorax. No acute osseous abnormality. IMPRESSION: Stable cardiomegaly.  No active cardiopulmonary disease. Electronically Signed   By: Obie Dredge M.D.   On: 08/16/2017 08:57    ____________________________________________   PROCEDURES  Procedure(s) performed:   Procedures  CRITICAL CARE Performed by: Maia Plan Total critical care time: 35 minutes Critical care time was exclusive of separately billable procedures and treating other patients. Critical care was necessary to treat or prevent imminent or life-threatening deterioration. Critical care was time spent personally by me on the following activities: development of treatment plan with patient and/or  surrogate as well as nursing, discussions with consultants, evaluation of patient's response to treatment, examination of patient, obtaining history from patient or surrogate, ordering and performing treatments and interventions, ordering and review of laboratory studies, ordering and review of radiographic studies, pulse oximetry and re-evaluation of patient's condition.  Alona Bene, MD Emergency Medicine  ____________________________________________   INITIAL IMPRESSION / ASSESSMENT AND PLAN / ED COURSE  Pertinent labs & imaging results that were available during my care of the patient were reviewed by me and considered in my medical decision making (see chart for details).  Patient presents to the emergency department for evaluation of generalized weakness and palpitations. Symptoms began 4-5 days ago. Patient has history of A. Fib but is not anticoagulated. Followed by Dr. Graciela Husbands. Plan to discuss the case with cardiology but do not feel the patient is an appropriate cardioversion candidate given his anticoagulation status and 4-5 days of symptoms. I have ordered for diltiazem bolus and infusion along with labs, IV fluids, and admission for rate control.  Spoke with Cardiology service who will consult on the patient.   10:05 AM HR remains elevated. Increasing Diltiazem to 10. BP remains normal. Normal mental status on re-evaluation. Paging medicine team for admission.   Discussed patient's case with IM teaching service to request admission. Patient and family (  if present) updated with plan. Care transferred to Internal Medicine service.  I reviewed all nursing notes, vitals, pertinent old records, EKGs, labs, imaging (as available).  ____________________________________________  FINAL CLINICAL IMPRESSION(S) / ED DIAGNOSES  Final diagnoses:  Atrial fibrillation with RVR (HCC)     MEDICATIONS GIVEN DURING THIS VISIT:  Medications  diltiazem (CARDIZEM) 1 mg/mL load via infusion 10  mg (10 mg Intravenous Bolus from Bag 08/16/17 0855)    And  diltiazem (CARDIZEM) 100 mg in dextrose 5 % 100 mL (1 mg/mL) infusion (12.5 mg/hr Intravenous Rate/Dose Change 08/16/17 1025)  heparin ADULT infusion 100 units/mL (25000 units/281mL sodium chloride 0.45%) (1,350 Units/hr Intravenous New Bag/Given 08/16/17 0936)  magnesium sulfate IVPB 1 g 100 mL (1 g Intravenous New Bag/Given 08/16/17 1026)  sodium chloride 0.9 % bolus 500 mL (0 mLs Intravenous Stopped 08/16/17 0942)  heparin bolus via infusion 5,500 Units (5,500 Units Intravenous Bolus from Bag 08/16/17 0936)     NEW OUTPATIENT MEDICATIONS STARTED DURING THIS VISIT:  None   Note:  This document was prepared using Dragon voice recognition software and may include unintentional dictation errors.  Alona Bene, MD Emergency Medicine   Kindrick Lankford, Arlyss Repress, MD 08/16/17 [phone]

## 2017-08-16 NOTE — Progress Notes (Signed)
Remote ICD transmission.   

## 2017-08-16 NOTE — Progress Notes (Signed)
ANTICOAGULATION CONSULT NOTE  Pharmacy Consult for apixaban Indication: atrial fibrillation  Assessment: 46 yom presenting with afib with RVR, hx of afib not on anticoagulation PTA. Pt initially started on IV heparin but now transitioning to apixaban. Baseline CBC is WNL. SCr is mildly elevated but full dose apixaban is appropriate.   Goal of Therapy:  Therapeutic anticoagulation and stroke prevention Monitor platelets by anticoagulation protocol: Yes   Plan:  Apixaban  PO BID F/u S&S of bleeding  Lysle Pearl, PharmD, BCPS 08/16/2017 3:49 PM

## 2017-08-16 NOTE — ED Notes (Signed)
Admitting MD at bedside.

## 2017-08-17 DIAGNOSIS — G4733 Obstructive sleep apnea (adult) (pediatric): Secondary | ICD-10-CM

## 2017-08-17 LAB — BASIC METABOLIC PANEL WITH GFR
Anion gap: 7 (ref 5–15)
BUN: 12 mg/dL (ref 6–20)
CO2: 23 mmol/L (ref 22–32)
Calcium: 8.7 mg/dL — ABNORMAL LOW (ref 8.9–10.3)
Chloride: 109 mmol/L (ref 101–111)
Creatinine, Ser: 1.3 mg/dL — ABNORMAL HIGH (ref 0.61–1.24)
GFR calc Af Amer: >60 mL/min
GFR calc non Af Amer: >60 mL/min
Glucose, Bld: 136 mg/dL — ABNORMAL HIGH (ref 65–99)
Potassium: 3.6 mmol/L (ref 3.5–5.1)
Sodium: 139 mmol/L (ref 135–145)

## 2017-08-17 LAB — HEMOGLOBIN A1C
Hgb A1c MFr Bld: 5.7 % — ABNORMAL HIGH (ref 4.8–5.6)
Mean Plasma Glucose: 116.89 mg/dL

## 2017-08-17 LAB — MRSA PCR SCREENING: MRSA by PCR: NEGATIVE

## 2017-08-17 LAB — CBC
HCT: 38.4 % — ABNORMAL LOW (ref 39.0–52.0)
Hemoglobin: 12.8 g/dL — ABNORMAL LOW (ref 13.0–17.0)
MCH: 31.8 pg (ref 26.0–34.0)
MCHC: 33.3 g/dL (ref 30.0–36.0)
MCV: 95.5 fL (ref 78.0–100.0)
Platelets: 287 K/uL (ref 150–400)
RBC: 4.02 MIL/uL — ABNORMAL LOW (ref 4.22–5.81)
RDW: 13.5 % (ref 11.5–15.5)
WBC: 11.8 K/uL — ABNORMAL HIGH (ref 4.0–10.5)

## 2017-08-17 LAB — HIV ANTIBODY (ROUTINE TESTING W REFLEX): HIV Screen 4th Generation wRfx: NONREACTIVE

## 2017-08-17 MED ORDER — AMIODARONE HCL 200 MG PO TABS
400.0000 mg | ORAL_TABLET | Freq: Three times a day (TID) | ORAL | Status: DC
  Administered 2017-08-17 – 2017-08-20 (×11): 400 mg via ORAL
  Filled 2017-08-17 (×13): qty 2

## 2017-08-17 MED ORDER — POTASSIUM CHLORIDE CRYS ER 20 MEQ PO TBCR
40.0000 meq | EXTENDED_RELEASE_TABLET | Freq: Once | ORAL | Status: AC
  Administered 2017-08-17: 40 meq via ORAL
  Filled 2017-08-17: qty 2

## 2017-08-17 MED ORDER — METOPROLOL TARTRATE 50 MG PO TABS
75.0000 mg | ORAL_TABLET | Freq: Two times a day (BID) | ORAL | Status: DC
  Administered 2017-08-17 – 2017-08-21 (×9): 75 mg via ORAL
  Filled 2017-08-17 (×4): qty 1
  Filled 2017-08-17: qty 3
  Filled 2017-08-17 (×4): qty 1

## 2017-08-17 NOTE — Progress Notes (Signed)
  Date: 08/17/2017  Patient name: Charles Wyatt  Medical record number: [id number]  Date of birth: 04/03/1971   I have seen and evaluated Dixie Dials and discussed their care with the Residency Team. Charles Wyatt ist a 46 yo man who presented with chief complaint of A. fib with RVR since the 13th. 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 He also has a history of proximal atrial fibrillation and has refused anticoagulation in the past. Episodes of A. fib are transient and have always spontaneously converted back to sinus. By report, on the 14th he had 3 alcoholic drinks. Due to ongoing palpitations along with decreased energy and increased fatigue, he presented to the ED. He was found to be in A. fib with RVR and was started on a diltiazem drip. This morning, he still notices his palpitations but denies any chest pain, dizziness, or lightheadedness.  Vitals:   08/17/17 1300 08/17/17 1341  BP: 112/77 106/90  Pulse:  94  Resp: (!) 23 (!) 21  Temp:    SpO2:  97%  T max 98.8 HR 70-116 RR 21 106/90 97% RS NAD, sitting in bed, able to speak in full sentences H irr irr, tachy, no murmurs appreciated LCATB with good air flow Ext no edema Large neck Protuberant abdomen  TSH 4 Cr 1.53 - 1.3, baseline 1.4 A1C 5.7 Trop 0.04  I personally viewed the CXR images and confirmed my reading with the official read. AP, portable, semi-eret, rotated, no gross abnl, R ICD  I personally viewed the EKG and confirmed my reading with the official read. A fib with RVR  Assessment and Plan: I have seen and evaluated the patient as outlined above. I agree with the formulated Assessment and Plan as detailed in the residents' note, with the following changes: Charles Wyatt is a 46 year old man with a HCM s/p ICD and paroxysmal atrial fibrillation. He now presents with another episode of A. fib with RVR, now day 5. He is being rate  controlled with a diltiazem drip along with his home metoprolol. His blood pressure is tolerating this. Amiodarone was added today for pharmaceutical cardioversion attempt. Cardiology is looking into TEE with cardioversion availability. He is being anticoagulated with Eliquis.  1. A. fib with RVR - titrate Cardizem drip for heart rate. Continue metoprolol and amiodarone. Follow up cardiology's recommendations regarding TEE and cardioversion. He is being anticoagulated with Eliquis.  2. HOCM - he has an ICD and is being followed with Dr. Graciela Husbands as an outpatient.  3. AKI - he had a transient bump in his creatinine likely due to decreased cardiac output while in RVR. Now that he is better rate controlled, his creatinine has come back to normal.  4. At risk for OSA - he has not had the time nor has he had insurance to pursue a sleep study. He is out of town most of the week for his job. He recently got insurance. His wife is encouraging him to get a PCP to follow up on outstanding health issues.  Burns Spain, MD 9/18/20182:02 PM

## 2017-08-17 NOTE — Progress Notes (Signed)
   Subjective:  Patient hypoxic to high 80s overnight and was started on 3 L of oxygen by nasal cannula. Patient states he is feeling well this morning. Denies chest pain, palpitations, and shortness of breath. Discussed plan with patient and he is in agreement.  Objective:  Vital signs in last 24 hours: Vitals:   08/17/17 0400 08/17/17 0500 08/17/17 0600 08/17/17 0630  BP: 95/69 109/78 115/74 121/74  Pulse:      Resp: 19 (!) 23 19 (!) 21  Temp:      TempSrc:      SpO2: 96%  96% 97%  Weight:      Height:       General: pleasant male, obese, well-developed, lying in bed in no acute distress Cardiac: tachycardic, irregularly irregular rhythm, nl S1/S2, no murmurs, rubs or gallops, no JVD noted Pulm: CTAB, no wheezes or crackles, no increased work of breathing  Abd: soft, NTND, bowel sounds present  Ext: warm and well perfused, no peripheral edema bilaterally   Assessment/Plan:  Charles Wyatt is a 46 year old male with a history HOCM s/p ICD and paroxsymal atrial fibrillation (not on anticoagulation) who presented with weakness, fatigue and palpitations and was found to be on A. Fib with RVR in the ED.  # Afib with RVR: Patient continues to be in A. Fib with RVR with HR 120s-140s while on diltiazem drip, hemodynamically stable.  PO amiodarone started today and metoprolol increased to 75 twice a day per cardiology recommendations. TEE/DCCV if no spontaneous conversion to normal sinus rhythm. - Started on PO amiodarone 400 mg TID started today  - On diltiazem gtt - Eliquis 5 mg BID  - Metoprolol increased to 75 mg BID for rate control  - On telemetry  # AKI on CKD: Resolved. Cr back to baseline at 1.3. Likely from hypoperfusion in the setting of A. Fib with RVR.  # HOCM s/p ICD: Patient follows up with Dr. Graciela Husbands as an outpatient. - Continue metoprolol as above  # OSA: Patient hypoxic to high 80s overnight and was started on 3 L of oxygen by nasal cannula. Per cardiology notes,  patient is suspected to have OSA but has not undergone sleep study due to lack of insurance.  - Recommended establishing with PCP for further evaluation  F: none E: will continue to monitor and replete as needed N: HH diet   VTE ppx: Eliquis as above  Code status: Full code    Dispo: Anticipated discharge in approximately 3-4 day(s) pending resolution/control of Afib with RVR.   Burna Cash, MD  Internal Medicine PGY-1  P [phone]

## 2017-08-17 NOTE — Progress Notes (Signed)
Progress Note  Patient Name: Charles Wyatt Date of Encounter: 08/17/2017  Primary Cardiologist: Dr. Eden Emms  Primary Electrophysiologist:  Dr. Graciela Husbands   Subjective   Intermittent palpitation. No chest pain or dyspnea.   Inpatient Medications    Scheduled Meds: . apixaban  5 mg Oral BID  . metoprolol tartrate  50 mg Oral BID  . sodium chloride flush  3 mL Intravenous Q12H   Continuous Infusions: . sodium chloride    . diltiazem (CARDIZEM) infusion 15 mg/hr (08/16/17 1046)   PRN Meds: sodium chloride, acetaminophen, ondansetron (ZOFRAN) IV, sodium chloride flush   Vital Signs    Vitals:   08/17/17 0730 08/17/17 0741 08/17/17 0800 08/17/17 0848  BP: 117/79 117/79 107/82 (!) 125/96  Pulse: 83 100 82 (!) 109  Resp: Temp:  98.8 F (37.1 C)    TempSrc:  Axillary    SpO2: 95% 97% 98% 93%  Weight:      Height:        Intake/Output Summary (Last 24 hours) at 08/17/17 0908 Last data filed at 08/16/17 1549  Gross per 24 hour  Intake           683.93 ml  Output                0 ml  Net           683.93 ml   Filed Weights   08/16/17 0841 08/16/17 0848  Weight: 236 lb (107 kg) 236 lb (107 kg)    Telemetry    afib at 120s- Personally Reviewed  ECG    None today   Physical Exam   GEN: Obsese male in no acute distress.   Neck: No JVD Cardiac: irregulary irregular tachycardic, no murmurs, rubs, or gallops.  Respiratory: Clear to auscultation bilaterally. GI: Soft, nontender, non-distended  MS: No edema; No deformity. Neuro:  Nonfocal  Psych: Normal affect   Labs    Chemistry Recent Labs Lab 08/16/17 0835 08/17/17 0500  NA 139 139  K 3.6 3.6  CL 112* 109  CO2 21* 23  GLUCOSE 163* 136*  BUN 13 12  CREATININE 1.53* 1.30*  CALCIUM 8.7* 8.7*  GFRNONAA 53* >60  GFRAA >60 >60  ANIONGAP 6 7     Hematology Recent Labs Lab 08/16/17 0835 08/17/17 0500  WBC 16.4* 11.8*  RBC 4.27 4.02*  HGB 13.5 12.8*  HCT 40.7 38.4*  MCV 95.3 95.5    MCH 31.6 31.8  MCHC 33.2 33.3  RDW 13.4 13.5  PLT 317 287    Cardiac EnzymesNo results for input(s): TROPONINI in the last 168 hours.  Recent Labs Lab 08/16/17 0839  TROPIPOC 0.04     BNPNo results for input(s): BNP, PROBNP in the last 168 hours.   DDimer No results for input(s): DDIMER in the last 168 hours.   Radiology    Dg Chest Port 1 View  Result Date: 08/16/2017 CLINICAL DATA:  Shortness of breath in weakness. EXAM: PORTABLE CHEST 1 VIEW COMPARISON:  Chest x-ray dated August 21, 2010. FINDINGS: Stable cardiomegaly. Unchanged right chest wall AICD with leads projecting over the right atrium and right ventricle. Normal pulmonary vascularity. No focal consolidation, pleural effusion, or pneumothorax. No acute osseous abnormality. IMPRESSION: Stable cardiomegaly.  No active cardiopulmonary disease. Electronically Signed   By: Obie Dredge M.D.   On: 08/16/2017 08:57    Cardiac Studies   Echo 10/2016 Study Conclusions  - Left ventricle: The cavity size was normal. Wall  thickness was   increased in a pattern of moderate LVH. There was severe focal   basal hypertrophy of the septum consistent with HCM. No   significant LVOT obstruction at rest or with valsalva. Systolic   function was normal. The estimated ejection fraction was in the   range of 50% to 55%. Inferior and basal to mid septal akinesis   with enhanced echogenicity suggestive of scar. Doppler parameters   are consistent with pseudormal left ventricular relaxation (grade   2 diastolic dysfunction). The medial E/e&' ratio is 15, suggesting   elevated &quot;LV filling pressure. - Mitral valve: Mildly thickened leaflets . Systolic bowing without   prolapse. There was mild regurgitation. - Left atrium: Severely dilated. - Right ventricle: The cavity size was normal. Wall thickness was   normal. AICD wire noted in right ventricle. Systolic function was   normal. - Right atrium: Moderately dilated. AICD wire  noted in right   atrium. - Tricuspid valve: There was mild regurgitation. - Pulmonary arteries: PA peak pressure: 27 mm Hg (S). - Inferior vena cava: The vessel was normal in size. The   respirophasic diameter changes were in the normal range (= 50%),   consistent with normal central venous pressure.  Impressions:  - Compared to a prior study in 2012, the LVEF is higher at 50-55%.   The IVSd is now 1.6 cm consistent with HCM. No rest or dynamic   LVOT obstruction is noted. There is inferior and basal to mid   septal akinesis and enhanced echogenicity suggestive of scar.  Patient Profile     46 y.o. male with a hx of HCM s/p AICD, family h/o SCD and HCM on mother's side, and personal h/o PAF on who is being seen today for the evaluation of afib w/ RVR.    Assessment & Plan    1. Afib RVR - Rate still in 120-130s on IV cardizem /hours and metoprolol  BID. Device check showed afib since sep 13th. Will increase metoprolol to  BID. Start Amiodarone po  TID.  TEE/DCCV first available is 9/26. Eliquis for anticoagulation. Normal TSH.   2. Hypomagnesemia - Mg of 1.6. Suppliment given  3. HCM - Strong family hx. S/p ICD. Followed by Dr. Graciela Husbands.   4. Obesity - Recommended weight loss and exercise.   For questions or updates, please contact [facility] Please consult [email or web address] for contact info under Cardiology/STEMI.      Signed, Manson Passey, PA  08/17/2017, 9:08 AM    Personally seen and examined. Agree with above.  No CP. Decreased SOB  Obese NAD, Irreg irreg tachy, trace edema  46 year old with HCM, ICD, PAF, obesity  - Adding amio 400 TID PO  - Continue IV dilt max dose  - increasing metoprolol to 75 BID  - Eliquis first dose 9/17 evening  - There is a backlog on sched for TEE/CV. Will continue to check for availability.   Donato Schultz, MD

## 2017-08-17 NOTE — ED Notes (Signed)
Attempted Report x1.   

## 2017-08-17 NOTE — ED Notes (Signed)
Admitting Team at the bedside 

## 2017-08-17 NOTE — ED Notes (Addendum)
Pt placed on 3 lpm [place] o2 d/t SpO2 decreasing to 87% while sleeping.

## 2017-08-17 NOTE — ED Notes (Signed)
Pt placed in hospital bed for comfort.

## 2017-08-17 NOTE — ED Notes (Signed)
Pt ordered a hospital bed at this time.

## 2017-08-18 DIAGNOSIS — Z6835 Body mass index (BMI) 35.0-35.9, adult: Secondary | ICD-10-CM

## 2017-08-18 DIAGNOSIS — Z95810 Presence of automatic (implantable) cardiac defibrillator: Secondary | ICD-10-CM

## 2017-08-18 LAB — CUP PACEART REMOTE DEVICE CHECK
Brady Statistic AP VS Percent: 0.42 %
Brady Statistic AS VS Percent: 99.17 %
HIGH POWER IMPEDANCE MEASURED VALUE: 77 Ohm
HighPow Impedance: 304 Ohm
HighPow Impedance: 66 Ohm
Implantable Lead Implant Date: 20110921
Implantable Lead Location: 753860
Implantable Lead Model: 6947
Lead Channel Impedance Value: 456 Ohm
Lead Channel Pacing Threshold Amplitude: 0.625 V
Lead Channel Pacing Threshold Pulse Width: 0.4 ms
Lead Channel Sensing Intrinsic Amplitude: 1.25 mV
Lead Channel Sensing Intrinsic Amplitude: 1.25 mV
Lead Channel Setting Pacing Amplitude: 2 V
Lead Channel Setting Pacing Pulse Width: 0.4 ms
Lead Channel Setting Sensing Sensitivity: 0.3 mV
MDC IDC LEAD IMPLANT DT: 20110921
MDC IDC LEAD LOCATION: 753859
MDC IDC MSMT BATTERY VOLTAGE: 2.62 V
MDC IDC MSMT LEADCHNL RA PACING THRESHOLD PULSEWIDTH: 0.4 ms
MDC IDC MSMT LEADCHNL RV IMPEDANCE VALUE: 361 Ohm
MDC IDC MSMT LEADCHNL RV PACING THRESHOLD AMPLITUDE: 0.625 V
MDC IDC MSMT LEADCHNL RV SENSING INTR AMPL: 7.125 mV
MDC IDC MSMT LEADCHNL RV SENSING INTR AMPL: 7.125 mV
MDC IDC PG IMPLANT DT: 20110921
MDC IDC SESS DTM: 20180914193528
MDC IDC SET LEADCHNL RV PACING AMPLITUDE: 2.5 V
MDC IDC STAT BRADY AP VP PERCENT: 0.35 %
MDC IDC STAT BRADY AS VP PERCENT: 0.06 %
MDC IDC STAT BRADY RA PERCENT PACED: 0.76 %
MDC IDC STAT BRADY RV PERCENT PACED: 0.34 %

## 2017-08-18 LAB — CBC
HCT: 37.2 % — ABNORMAL LOW (ref 39.0–52.0)
HEMOGLOBIN: 12.4 g/dL — AB (ref 13.0–17.0)
MCH: 32 pg (ref 26.0–34.0)
MCHC: 33.3 g/dL (ref 30.0–36.0)
MCV: 96.1 fL (ref 78.0–100.0)
Platelets: 299 10*3/uL (ref 150–400)
RBC: 3.87 MIL/uL — ABNORMAL LOW (ref 4.22–5.81)
RDW: 13.5 % (ref 11.5–15.5)
WBC: 13.2 10*3/uL — ABNORMAL HIGH (ref 4.0–10.5)

## 2017-08-18 LAB — BASIC METABOLIC PANEL
Anion gap: 8 (ref 5–15)
BUN: 11 mg/dL (ref 6–20)
CHLORIDE: 104 mmol/L (ref 101–111)
CO2: 23 mmol/L (ref 22–32)
CREATININE: 1.59 mg/dL — AB (ref 0.61–1.24)
Calcium: 8.6 mg/dL — ABNORMAL LOW (ref 8.9–10.3)
GFR calc Af Amer: 59 mL/min — ABNORMAL LOW (ref >60)
GFR calc non Af Amer: 51 mL/min — ABNORMAL LOW (ref >60)
Glucose, Bld: 140 mg/dL — ABNORMAL HIGH (ref 65–99)
Potassium: 3.8 mmol/L (ref 3.5–5.1)
SODIUM: 135 mmol/L (ref 135–145)

## 2017-08-18 LAB — MAGNESIUM: Magnesium: 1.7 mg/dL (ref 1.7–2.4)

## 2017-08-18 MED ORDER — METOPROLOL TARTRATE 5 MG/5ML IV SOLN
5.0000 mg | Freq: Once | INTRAVENOUS | Status: AC
  Administered 2017-08-18: 5 mg via INTRAVENOUS
  Filled 2017-08-18: qty 5

## 2017-08-18 NOTE — Progress Notes (Signed)
   Subjective:  Received IV metoprolol 5 mg x1 for elevated HR in the 150s with good response. This morning patient states he is feeling well. Denies chest pain, palpitations, and shortness of breath.  Reports no complaints. Discussed plan with patient and wife and answered all questions.  Objective:  Vital signs in last 24 hours: Vitals:   08/17/17 2031 08/18/17 0003 08/18/17 0255 08/18/17 0510  BP: 106/78 101/62 (!) 112/94 109/78  Pulse: (!) 130 88  80  Resp: Temp:    98.6 F (37 C)  TempSrc:    Oral  SpO2: 95% 93%  99%  Weight:    238 lb 3.2 oz (108 kg)  Height:       General: pleasant male, obese, well-developed, lying in bed in no acute distress Cardiac: tachycardic, irregularly irregular rhythm, nl S1/S2, no murmurs, rubs or gallops, no JVD noted Pulm: CTAB, no wheezes or crackles, no increased work of breathing  Abd: soft, NTND, bowel sounds present  Ext: warm and well perfused, no peripheral edema bilaterally   Assessment/Plan:  Charles Wyatt is a 46 year old male with a history HOCM s/p ICD and paroxsymal atrial fibrillation (not on anticoagulation) who presented with weakness, fatigue and palpitations and was found to be on A. Fib with RVR in the ED.  # Afib with RVR: Patient continues to be in A. Fib with RVR with HR 120s-140s while on diltiazem drip, PO amiodarone and PO metoprolol. Currently hemodynamically stable. Cardiology following and recommendations as below.  - Plan for TEE and DCCV on 9/21 - Continue PO amiodarone 400 mg TID  - On diltiazem gtt - Eliquis 5 mg BID  - Metoprolol increased to 75 mg BID for rate control  - On telemetry  # HOCM s/p ICD: Patient follows up with Dr. Graciela Husbands as an outpatient. - Continue metoprolol as above  # OSA:  Per cardiology notes, patient is suspected to have OSA but has not undergone sleep study due to lack of insurance.  - Recommended establishing with PCP for further evaluation  F: none E: will continue to  monitor and replete as needed N: HH diet   VTE ppx: Eliquis as above  Code status: Full code    Dispo: Anticipated discharge in approximately 3-4 day(s) pending resolution/control of Afib with RVR.   Burna Cash, MD  Internal Medicine PGY-1  P [phone]

## 2017-08-18 NOTE — Progress Notes (Signed)
Patient had a temp of 100.3 this morning. Felt warm to touch. Was given tylenol. Patient started c/o being clammy. Temp was rechecked and it was 98.5 and BP was 109/76. Will continue to monitor.

## 2017-08-18 NOTE — Progress Notes (Signed)
Progress Note  Patient Name: Charles Wyatt Date of Encounter: 08/18/2017  Primary Cardiologist:Dr. Eden Emms  Primary Electrophysiologist:Dr. Graciela Husbands   Subjective   No chest pain or dyspnea. Intermittent feels palpitation.   Inpatient Medications    Scheduled Meds: . amiodarone  400 mg Oral TID  . apixaban  5 mg Oral BID  . metoprolol tartrate  75 mg Oral BID  . sodium chloride flush  3 mL Intravenous Q12H   Continuous Infusions: . sodium chloride    . diltiazem (CARDIZEM) infusion 15 mg/hr (08/18/17 0636)   PRN Meds: sodium chloride, acetaminophen, ondansetron (ZOFRAN) IV, sodium chloride flush   Vital Signs    Vitals:   08/18/17 0255 08/18/17 0510 08/18/17 0743 08/18/17 0913  BP: (!) 112/94 109/78 (!) 116/95 110/85  Pulse:  80 97 (!) 140  Resp:  19 18   Temp:  98.6 F (37 C) 100.3 F (37.9 C)   TempSrc:  Oral Oral   SpO2:  99% 90%   Weight:  238 lb 3.2 oz (108 kg)    Height:        Intake/Output Summary (Last 24 hours) at 08/18/17 1044 Last data filed at 08/18/17 1011  Gross per 24 hour  Intake            600.5 ml  Output              700 ml  Net            -99.5 ml   Filed Weights   08/16/17 0841 08/16/17 0848 08/18/17 0510  Weight: 236 lb (107 kg) 236 lb (107 kg) 238 lb 3.2 oz (108 kg)    Telemetry    afib at rate of 70-130s - Personally Reviewed  ECG    N/A - Personally Reviewed  Physical Exam   GEN: No acute distress.   Neck: No JVD Cardiac: Irregularly irregular, no murmurs, rubs, or gallops.  Respiratory: Clear to auscultation bilaterally. GI: Soft, nontender, non-distended  MS: No edema; No deformity. Neuro:  Nonfocal  Psych: Normal affect   Labs    Chemistry Recent Labs Lab 08/16/17 0835 08/17/17 0500 08/18/17 0651  NA 139 139 135  K 3.6 3.6 3.8  CL 112* 109 104  CO2 21* 23 23  GLUCOSE 163* 136* 140*  BUN CREATININE 1.53* 1.30* 1.59*  CALCIUM 8.7* 8.7* 8.6*  GFRNONAA 53* >60 51*  GFRAA >60 >60 59*    ANIONGAP Hematology Recent Labs Lab 08/16/17 0835 08/17/17 0500 08/18/17 0345  WBC 16.4* 11.8* 13.2*  RBC 4.27 4.02* 3.87*  HGB 13.5 12.8* 12.4*  HCT 40.7 38.4* 37.2*  MCV 95.3 95.5 96.1  MCH 31.6 31.8 32.0  MCHC 33.2 33.3 33.3  RDW 13.4 13.5 13.5  PLT 317 287 299    Cardiac EnzymesNo results for input(s): TROPONINI in the last 168 hours.  Recent Labs Lab 08/16/17 0839  TROPIPOC 0.04      Radiology    No results found.  Cardiac Studies   *Echo 10/2016 Study Conclusions  - Left ventricle: The cavity size was normal. Wall thickness was increased in a pattern of moderate LVH. There was severe focal basal hypertrophy of the septum consistent with HCM. No significant LVOT obstruction at rest or with valsalva. Systolic function was normal. The estimated ejection fraction was in the range of 50% to 55%. Inferior and basal to mid septal akinesis with enhanced echogenicity suggestive of scar. Doppler parameters  are consistent with pseudormal left ventricular relaxation (grade 2 diastolic dysfunction). The medial E/e&' ratio is 15, suggesting elevated &quot;LV filling pressure. - Mitral valve: Mildly thickened leaflets . Systolic bowing without prolapse. There was mild regurgitation. - Left atrium: Severely dilated. - Right ventricle: The cavity size was normal. Wall thickness was normal. AICD wire noted in right ventricle. Systolic function was normal. - Right atrium: Moderately dilated. AICD wire noted in right atrium. - Tricuspid valve: There was mild regurgitation. - Pulmonary arteries: PA peak pressure: 27 mm Hg (S). - Inferior vena cava: The vessel was normal in size. The respirophasic diameter changes were in the normal range (= 50%), consistent with normal central venous pressure.  Impressions:  - Compared to a prior study in 2012, the LVEF is higher at 50-55%. The IVSd is now 1.6 cm consistent with HCM. No  rest or dynamic LVOT obstruction is noted. There is inferior and basal to mid septal akinesis and enhanced echogenicity suggestive of scar.  Patient Profile     46 y.o. male with a hx of HCM s/p AICD, family h/o SCD and HCM on mother's side,and personal h/o PAF on who is being seen today for the evaluation of afib w/ RVR.    Assessment & Plan    1. Afib RVR - Rate is improved but still goes >130s at times. Continue IV cardizem, BB and amiodarone. Eliquis started 9/17 PM.  - TEE/DCCV Friday  with Dr. Mayford Knife.  Normal TSH.   2. Hypomagnesemia - Mg of 1.6. Suppliment given. Recheck labs.   3. HCM - Strong family hx. S/p ICD. Followed by Dr. Graciela Husbands.   4. Obesity - Recommended weight loss and exercise.   5. AKI - Follow closely  For questions or updates, please contact [facility] Please consult [email or web address] for contact info under Cardiology/STEMI.      Signed, Manson Passey, PA  08/18/2017, 10:44 AM    Personally seen and examined. Agree with above.  No CP, no SOB  Irreg irreg, CTAB, no edema, alert  AFIB - RVR  - plan on TEE/CV Friday  - Eliquis  - AMIO load  - IV cardizem and bb  Replete Mag, weight loss, HCM-ICD  Donato Schultz, MD

## 2017-08-18 NOTE — Progress Notes (Addendum)
[facility] PGY1 paged. Patient heart rate on arrival to shift 120-150s with occasional jumps to 170. Evening Medications administered, patients heart rate down to 70-100 until 345am  Patients heart rate then started to climb back into the 120s with occasional jumps into the 140s. Dilt drip running at 19ml/hr. Awaiting new orders.    04:17 Cardiology paged per [facility] PGY1   IV Lopressor 1 time order now.   0530: IV lopressor effective. HR 80-100's. Pt.

## 2017-08-19 ENCOUNTER — Telehealth: Payer: Self-pay | Admitting: Internal Medicine

## 2017-08-19 LAB — CBC
HEMATOCRIT: 36.9 % — AB (ref 39.0–52.0)
Hemoglobin: 12.1 g/dL — ABNORMAL LOW (ref 13.0–17.0)
MCH: 31.3 pg (ref 26.0–34.0)
MCHC: 32.8 g/dL (ref 30.0–36.0)
MCV: 95.6 fL (ref 78.0–100.0)
PLATELETS: 310 10*3/uL (ref 150–400)
RBC: 3.86 MIL/uL — ABNORMAL LOW (ref 4.22–5.81)
RDW: 13.4 % (ref 11.5–15.5)
WBC: 13.8 10*3/uL — AB (ref 4.0–10.5)

## 2017-08-19 LAB — BASIC METABOLIC PANEL
Anion gap: 9 (ref 5–15)
BUN: 12 mg/dL (ref 6–20)
CALCIUM: 8.5 mg/dL — AB (ref 8.9–10.3)
CO2: 24 mmol/L (ref 22–32)
CREATININE: 1.45 mg/dL — AB (ref 0.61–1.24)
Chloride: 102 mmol/L (ref 101–111)
GFR calc Af Amer: >60 mL/min (ref >60)
GFR, EST NON AFRICAN AMERICAN: 57 mL/min — AB (ref >60)
GLUCOSE: 143 mg/dL — AB (ref 65–99)
Potassium: 3.3 mmol/L — ABNORMAL LOW (ref 3.5–5.1)
Sodium: 135 mmol/L (ref 135–145)

## 2017-08-19 MED ORDER — POTASSIUM CHLORIDE CRYS ER 20 MEQ PO TBCR
40.0000 meq | EXTENDED_RELEASE_TABLET | Freq: Two times a day (BID) | ORAL | Status: AC
  Administered 2017-08-19 (×2): 40 meq via ORAL
  Filled 2017-08-19 (×2): qty 2

## 2017-08-19 NOTE — Care Management Note (Addendum)
Case Management Note  Patient Details  Name: Charles Wyatt MRN: [id number] Date of Birth: [birth year]  Subjective/Objective: Pt presented for Atrial Fib RVR-Plan will be for home on Eliquis.                    Action/Plan: Benefits Check completed and CM will make pt aware of cost. 30 day free card provided from Pharmacist. No further needs from CM at this time.   Expected Discharge Date:                  Expected Discharge Plan:  Home/Self Care  In-House Referral:  NA  Discharge planning Services  CM Consult  Post Acute Care Choice:  NA Choice offered to:  NA  DME Arranged:  N/A DME Agency:  NA  HH Arranged:  NA HH Agency:  NA  Status of Service:  Completed, signed off  If discussed at Long Length of Stay Meetings, dates discussed:  08-24-17  Additional Comments: 08-24-17 1031 Tomi Bamberger, RN, BSN [phone] Pt continues on IV Cardizem Gtt. Plan for po amiodarone. TEE/ Cardioversion 08-20-17 not successful-Plan for DCCV 08-24-17. No home needs identified.      S/W TRACI @  [employer] [facility] # [phone]   1. ELIQUIS 5 MG BID   COVER- YES  CO-PAY- $ 30.00  TIER- 2 DRUG  PRIOR APPROVAL-NO    2. ELIQUIS 2.5 MG BID  SAME AS ABOVE   APIXABAN : NONE FORMULARY   PREFERRED PHARMACY : [facility], Orlan Leavens, RN 08/19/2017, 8:53 AM

## 2017-08-19 NOTE — Plan of Care (Signed)
Problem: Nutrition: Goal: Adequate nutrition will be maintained Outcome: Progressing Patient encouraged to increase oral intake. Patient intake slightly improved over the past two evenings.

## 2017-08-19 NOTE — Progress Notes (Signed)
Progress Note  Patient Name: Charles Wyatt Date of Encounter: 08/19/2017  Primary Cardiologist: Graciela Husbands  Subjective   No cp, no sob, in bed. Waiting for CV  Inpatient Medications    Scheduled Meds: . amiodarone  400 mg Oral TID  . apixaban  5 mg Oral BID  . metoprolol tartrate  75 mg Oral BID  . sodium chloride flush  3 mL Intravenous Q12H   Continuous Infusions: . sodium chloride    . diltiazem (CARDIZEM) infusion 15 mg/hr (08/19/17 0906)   PRN Meds: sodium chloride, acetaminophen, ondansetron (ZOFRAN) IV, sodium chloride flush   Vital Signs    Vitals:   08/19/17 0423 08/19/17 0500 08/19/17 0530 08/19/17 0747  BP: 121/84   (!) 116/95  Pulse: 100   90  Resp: 19   18  Temp: 99.1 F (37.3 C)   98.6 F (37 C)  TempSrc: Oral   Oral  SpO2: 92% 91% 90% 90%  Weight: 238 lb 8 oz (108.2 kg)     Height:        Intake/Output Summary (Last 24 hours) at 08/19/17 1038 Last data filed at 08/19/17 0424  Gross per 24 hour  Intake           632.25 ml  Output              500 ml  Net           132.25 ml   Filed Weights   08/16/17 0848 08/18/17 0510 08/19/17 0423  Weight: 236 lb (107 kg) 238 lb 3.2 oz (108 kg) 238 lb 8 oz (108.2 kg)    Telemetry    A. fib - Personally Reviewed  ECG    Atrial fibrillation - Personally Reviewed  Physical Exam   GEN: Well nourished, well developed, in no acute distress obese HEENT: normal  Neck: no JVD, carotid bruits, or masses Cardiac: irreg irreg; no murmurs, rubs, or gallops,no edema  Respiratory:  clear to auscultation bilaterally, normal work of breathing GI: soft, nontender, nondistended, + BS MS: no deformity or atrophy  Skin: warm and dry, no rash Neuro:  Alert and Oriented x 3, Strength and sensation are intact Psych: euthymic mood, full affect   Labs    Chemistry Recent Labs Lab 08/17/17 0500 08/18/17 0651 08/19/17 0449  NA 139 135 135  K 3.6 3.8 3.3*  CL 109 104 102  CO2 GLUCOSE 136* 140* 143*    BUN CREATININE 1.30* 1.59* 1.45*  CALCIUM 8.7* 8.6* 8.5*  GFRNONAA >60 51* 57*  GFRAA >60 59* >60  ANIONGAP Hematology Recent Labs Lab 08/17/17 0500 08/18/17 0345 08/19/17 0449  WBC 11.8* 13.2* 13.8*  RBC 4.02* 3.87* 3.86*  HGB 12.8* 12.4* 12.1*  HCT 38.4* 37.2* 36.9*  MCV 95.5 96.1 95.6  MCH 31.8 32.0 31.3  MCHC 33.3 33.3 32.8  RDW 13.5 13.5 13.4  PLT 287 299 310    Cardiac EnzymesNo results for input(s): TROPONINI in the last 168 hours.  Recent Labs Lab 08/16/17 0839  TROPIPOC 0.04     BNPNo results for input(s): BNP, PROBNP in the last 168 hours.   DDimer No results for input(s): DDIMER in the last 168 hours.   Radiology    No results found.  Cardiac Studies   Echocardiogram 08/18/17  - EF 50-55% with hypertrophic cardiomyopathy  - Severely dilated left atrium  - ICD  Patient Profile  46 y.o. male with paroxysmal atrial fibrillation with rapid ventricular response, hypertrophic cardio myopathy status post ICD  Assessment & Plan    Paroxysmal atrial fibrillation with rapid ventricular response  - TEE cardioversion has been set up for Friday at 10 AM with Dr. Mayford Knife. Unfortunately there were no earlier times available.  - Currently continuing with amiodarone load, IV diltiazem and beta blocker.  - hopefully amiodarone will not need to be long-term.  Hypokalemia  - Repleting   Hypertrophic cardiomyopathy  - Family history, ICD, Graciela Husbands  Obesity   - continuing to recommend weight loss  AKI  - Stable.   For questions or updates, please contact [facility] Please consult [email or web address] for contact info under Cardiology/STEMI.      Signed, Donato Schultz, MD  08/19/2017, 10:38 AM

## 2017-08-19 NOTE — Progress Notes (Signed)
Patient on 2 liters nasal canula. A-Fib on tele. Upon arrival to shift, HR 120-130. After evening round of amio and lopressor PO, HR 80-100bpm. At 330am heart rate started to once again steadily climb with occasional jumps to 140 and 150bpm with activity. At rest heart rate 90s-120s. Will continue to monitor.

## 2017-08-19 NOTE — Progress Notes (Signed)
   Subjective:   No acute events overnight. Pt doing well and is scheduled for TEE cardioversion tomorrow in morning   Objective:  Vital signs in last 24 hours: Vitals:   08/19/17 0530 08/19/17 0745 08/19/17 0747 08/19/17 1602  BP:   (!) 116/95 (!) 113/91  Pulse:   90 75  Resp:   18 18  Temp:   98.6 F (37 C) 97.9 F (36.6 C)  TempSrc:   Oral Oral  SpO2: 90% (!) 89% 90% 95%  Weight:      Height:       Per Dr. Evelene Croon' Exam, as verbally told to me below and observed: General: pleasant male, obese, well-developed, sitting up on bed Cardiac: tachycardic, irregularly irregular rhythm, nl S1/S2, no murmurs, rubs or gallops, no JVD noted Pulm: CTAB, no wheezes or crackles, no increased work of breathing  Abd: soft, NTND, bowel sounds present  Ext: warm and well perfused, no peripheral edema bilaterally   Assessment/Plan:  Charles Wyatt is a 46 year old male with a history HOCM s/p ICD and paroxsymal atrial fibrillation (not on anticoagulation) who presented with weakness, fatigue and palpitations and was found to be on A. Fib with RVR in the ED.  # Afib with RVR: Patient continues to be in A. Fib with RVR with HR 120s-140s while on diltiazem drip, PO amiodarone and PO metoprolol. Currently hemodynamically stable. Cardiology following and recommendations as below.   - Plan for TEE and DCCV on 9/21 - Continue PO amiodarone 400 mg TID  - On diltiazem gtt - Eliquis 5 mg BID  - Metoprolol increased to 75 mg BID for rate control  - On telemetry  # HOCM s/p ICD: Patient follows up with Dr. Graciela Husbands as an outpatient. - Continue metoprolol as above  # OSA:  Per cardiology notes, patient is suspected to have OSA but has not undergone sleep study due to lack of insurance.  - Recommended establishing with PCP for further evaluation  F: none E: will continue to monitor and replete as needed N: HH diet   VTE ppx: Eliquis as above  Code status: Full code    Dispo: Anticipated  discharge in approximately 3-4 day(s) pending resolution/control of Afib with RVR.    Deneise Lever MD IMTS Resident

## 2017-08-19 NOTE — Telephone Encounter (Signed)
Pt's family member Jacki Cones called in behalf of pt to let Dr. Graciela Husbands know that pt is in [facility] hospital since 9/17 with A-Fib. Pt would like for Dr. Graciela Husbands to review pt's hospital notes. There are no issues at this time, but he will feel better because Dr. Graciela Husbands is his doctor. Pt is aware that I will let Md know.

## 2017-08-19 NOTE — Telephone Encounter (Signed)
New Message  Pt wife call to inform that pt is in the hospital. She would like for hospital notes to be reviewed. Please call back to discuss if needed.

## 2017-08-20 ENCOUNTER — Inpatient Hospital Stay (HOSPITAL_COMMUNITY): Payer: Managed Care, Other (non HMO) | Admitting: Certified Registered Nurse Anesthetist

## 2017-08-20 ENCOUNTER — Inpatient Hospital Stay (HOSPITAL_COMMUNITY): Payer: Managed Care, Other (non HMO)

## 2017-08-20 ENCOUNTER — Encounter: Payer: Self-pay | Admitting: Cardiology

## 2017-08-20 ENCOUNTER — Encounter (HOSPITAL_COMMUNITY): Payer: Self-pay | Admitting: Emergency Medicine

## 2017-08-20 ENCOUNTER — Encounter (HOSPITAL_COMMUNITY)
Admission: EM | Disposition: A | Discharge status: Home / Self Care | Payer: Self-pay | Source: Home / Self Care | Attending: Internal Medicine

## 2017-08-20 DIAGNOSIS — I4891 Unspecified atrial fibrillation: Secondary | ICD-10-CM

## 2017-08-20 DIAGNOSIS — I340 Nonrheumatic mitral (valve) insufficiency: Secondary | ICD-10-CM

## 2017-08-20 DIAGNOSIS — R0603 Acute respiratory distress: Secondary | ICD-10-CM

## 2017-08-20 HISTORY — PX: CARDIOVERSION: SHX1299

## 2017-08-20 HISTORY — PX: TEE WITHOUT CARDIOVERSION: SHX5443

## 2017-08-20 LAB — BASIC METABOLIC PANEL
ANION GAP: 11 (ref 5–15)
BUN: 13 mg/dL (ref 6–20)
CO2: 21 mmol/L — ABNORMAL LOW (ref 22–32)
Calcium: 8.5 mg/dL — ABNORMAL LOW (ref 8.9–10.3)
Chloride: 104 mmol/L (ref 101–111)
Creatinine, Ser: 1.34 mg/dL — ABNORMAL HIGH (ref 0.61–1.24)
GFR calc Af Amer: >60 mL/min (ref >60)
Glucose, Bld: 122 mg/dL — ABNORMAL HIGH (ref 65–99)
POTASSIUM: 3.9 mmol/L (ref 3.5–5.1)
Sodium: 136 mmol/L (ref 135–145)

## 2017-08-20 LAB — CBC
HEMATOCRIT: 35.6 % — AB (ref 39.0–52.0)
HEMOGLOBIN: 11.8 g/dL — AB (ref 13.0–17.0)
MCH: 31.8 pg (ref 26.0–34.0)
MCHC: 33.1 g/dL (ref 30.0–36.0)
MCV: 96 fL (ref 78.0–100.0)
PLATELETS: 318 10*3/uL (ref 150–400)
RBC: 3.71 MIL/uL — AB (ref 4.22–5.81)
RDW: 13.4 % (ref 11.5–15.5)
WBC: 13.3 10*3/uL — AB (ref 4.0–10.5)

## 2017-08-20 LAB — BLOOD GAS, ARTERIAL
ACID-BASE DEFICIT: 0.6 mmol/L (ref 0.0–2.0)
BICARBONATE: 23.2 mmol/L (ref 20.0–28.0)
DELIVERY SYSTEMS: POSITIVE
DRAWN BY: 24610
Expiratory PAP: 8
FIO2: 70
Inspiratory PAP: 14
O2 Saturation: 96.4 %
PATIENT TEMPERATURE: 98.6
PCO2 ART: 35.5 mmHg (ref 32.0–48.0)
pH, Arterial: 7.43 (ref 7.350–7.450)
pO2, Arterial: 88.4 mmHg (ref 83.0–108.0)

## 2017-08-20 LAB — LACTIC ACID, PLASMA: Lactic Acid, Venous: 1.3 mmol/L (ref 0.5–1.9)

## 2017-08-20 LAB — PROCALCITONIN: Procalcitonin: 0.11 ng/mL

## 2017-08-20 LAB — BRAIN NATRIURETIC PEPTIDE: B NATRIURETIC PEPTIDE 5: 1158.2 pg/mL — AB (ref 0.0–100.0)

## 2017-08-20 SURGERY — ECHOCARDIOGRAM, TRANSESOPHAGEAL
Anesthesia: Monitor Anesthesia Care

## 2017-08-20 MED ORDER — SODIUM CHLORIDE 0.9 % IV SOLN
INTRAVENOUS | Status: DC
  Administered 2017-08-20 (×2): via INTRAVENOUS

## 2017-08-20 MED ORDER — PIPERACILLIN-TAZOBACTAM 3.375 G IVPB 30 MIN
3.3750 g | Freq: Once | INTRAVENOUS | Status: AC
  Administered 2017-08-20: 3.375 g via INTRAVENOUS
  Filled 2017-08-20: qty 50

## 2017-08-20 MED ORDER — LIDOCAINE HCL (CARDIAC) 20 MG/ML IV SOLN
INTRAVENOUS | Status: DC | PRN
  Administered 2017-08-20: 100 mg via INTRAVENOUS

## 2017-08-20 MED ORDER — FUROSEMIDE 10 MG/ML IJ SOLN
INTRAMUSCULAR | Status: AC
  Filled 2017-08-20: qty 4

## 2017-08-20 MED ORDER — PROPOFOL 500 MG/50ML IV EMUL
INTRAVENOUS | Status: DC | PRN
  Administered 2017-08-20: 100 ug/kg/min via INTRAVENOUS

## 2017-08-20 MED ORDER — FUROSEMIDE 10 MG/ML IJ SOLN
40.0000 mg | Freq: Once | INTRAMUSCULAR | Status: AC
  Administered 2017-08-20: 40 mg via INTRAVENOUS

## 2017-08-20 MED ORDER — PIPERACILLIN-TAZOBACTAM 3.375 G IVPB
3.3750 g | Freq: Three times a day (TID) | INTRAVENOUS | Status: DC
  Administered 2017-08-20 – 2017-08-23 (×8): 3.375 g via INTRAVENOUS
  Filled 2017-08-20 (×9): qty 50

## 2017-08-20 MED ORDER — PROPOFOL 10 MG/ML IV BOLUS
INTRAVENOUS | Status: DC | PRN
  Administered 2017-08-20: 50 mg via INTRAVENOUS

## 2017-08-20 MED ORDER — BUTAMBEN-TETRACAINE-BENZOCAINE 2-2-14 % EX AERO
INHALATION_SPRAY | CUTANEOUS | Status: DC | PRN
  Administered 2017-08-20: 2 via TOPICAL

## 2017-08-20 NOTE — Progress Notes (Signed)
   Subjective:  No acute events overnight. Patient denies chest pain, SOB, and palpitations. No complaints this morning. Plan for TEE/DCCV today. Will follow up after procedure.    Objective:  Vital signs in last 24 hours: Vitals:   08/19/17 1958 08/19/17 2152 08/20/17 0300 08/20/17 0452  BP: 122/85 116/76 116/83 122/89  Pulse: (!) 123   92  Resp: 17   19  Temp: 98.1 F (36.7 C)   98 F (36.7 C)  TempSrc: Oral   Oral  SpO2: 92%   91%  Weight:    237 lb 1.6 oz (107.5 kg)  Height:       General:pleasant male, obese, well-developed, lying in bed in no acute distress  Cardiac: tachycardic with irregularly irregular rhythm, nl S1/S2, no murmurs, rubs or gallops  Pulm: CTAB, no wheezes or crackles, no increased work of breathing  Abd: obese, soft, NTND, bowel sounds present  Neuro: A&Ox3, able to move all 4 extremities, no focal deficits noted  Ext: warm and well perfused, no peripheral edema   Assessment/Plan:  Kadin Canipe is a 46 year old male with a history HOCM s/p ICD and paroxsymal atrial fibrillation (not on anticoagulation) who presented with weakness, fatigue and palpitations and was found to be on A. Fib with RVR in the ED.  # Afib with RVR: Patient continues to be in A. Fib with RVR with HR 100s-130s while on diltiazem drip, PO amiodarone and PO metoprolol. Currently hemodynamically stable. Cardiology following and recommendations as below.  - Plan for TEE and DCCV today. F/u cardiology recommendations  - Continue PO amiodarone 400 mg TID  - On diltiazem gtt - Eliquis 5 mg BID  - Metoprolol increased to 75 mg BID for rate control  - On telemetry  # HOCM s/p ICD: Patient follows up with Dr. Graciela Husbands as an outpatient. - Continue metoprolol as above  # OSA:  Per cardiology notes, patient is suspected to have OSA but has not undergone sleep study due to lack of insurance.  - Recommended establishing with PCP for further evaluation  F: none E: will continue to monitor  and replete as needed N: HH diet   VTE ppx: Eliquis as above  Code status: Full code    Dispo: Anticipated discharge in approximately 1-3 day(s) pending resolution/control of Afib with RVR.    Burna Cash, MD  Internal Medicine PGY-1  P [phone]

## 2017-08-20 NOTE — Progress Notes (Signed)
RT called STAT to endo d/t pt in resp distress.  Found sats 88% on NRB, pt w/ increased WOB and RR 35-40 bpm.  MD at bedside requests bipap.  Pt placed on bipap and within minutes pt WOB, sat and RR improving.  Pt states his breathing feels improved on bipap currently.  VSS currently, sat 97% on bipap settings charted.  BBSH w/ crackles and diminished t/o.  RN at bedside, MD aware.

## 2017-08-20 NOTE — Transfer of Care (Signed)
Immediate Anesthesia Transfer of Care Note  Patient: Charles Wyatt  Procedure(s) Performed: Procedure(s): TRANSESOPHAGEAL ECHOCARDIOGRAM (TEE) (N/A) CARDIOVERSION (N/A)  Patient Location: Endoscopy Unit  Anesthesia Type:MAC  Level of Consciousness: awake, alert  and oriented  Airway & Oxygen Therapy: Patient Spontanous Breathing and Patient connected to nasal cannula oxygen  Post-op Assessment: Report given to RN and Post -op Vital signs reviewed and stable  Post vital signs: Reviewed and stable  Last Vitals:  Vitals:   08/20/17 0920 08/20/17 1054  BP: 127/81 (!) 103/53  Pulse:  75  Resp: (!) 23 19  Temp: 36.8 C   SpO2: 91% (!) 89%    Last Pain:  Vitals:   08/20/17 0920  TempSrc: Oral  PainSc:       Patients Stated Pain Goal: 0 (08/81/10 3159)  Complications: No apparent anesthesia complications

## 2017-08-20 NOTE — Plan of Care (Signed)
Problem: Pain Managment: Goal: General experience of comfort will improve Outcome: Progressing Pt currently denies any pain. Continue to monitor.  Problem: Physical Regulation: Goal: Ability to maintain clinical measurements within normal limits will improve Outcome: Progressing Pt remains on cardizem gtt. HR better controlled. HR 80-120. Other VSS. PO medications administered. Pt currently NPO for TEE Cardioversion today. Will continue to monitor.

## 2017-08-20 NOTE — Progress Notes (Signed)
   Post TEE with anesthesia increased RR, decreased sat. CXR personally viewed - R sided infiltrate Lasix 40 IV x1 Sitting on edge of bed now, Bipap mask being held to his face Crackles R>L  Coughed mild blood tinged sputum.  Concerned about aspiration vs. Acute pulm edema? No fever. Calm for the past few days prior to CV.   Consulting pulmonary to evaluate acute respiratory distress.   Donato Schultz, MD

## 2017-08-20 NOTE — CV Procedure (Signed)
    PROCEDURE NOTE:  Procedure:  Transesophageal echocardiogram Operator:  Armanda Magic, MD Indications:  Atrial fibrillation Complications: None  During this procedure the patient is administered a total of Propofol 250 mg to achieve and maintain moderate conscious sedation.  The patient's heart rate, blood pressure, and oxygen saturation are monitored continuously during the procedure by anesthesia.  Results: Normal LV size and function.  Marked severe basal septal hypertrophy.  Normal RV size and function Normal RA Massively dilated LA with mild spontaneous echo contrast.  Normal large LA appendage with no evidence of thrombus.  Normal TV with trivial TR Normal PV with trivial PR Normal MV with moderate to severe MR eccentric and wraps around the LA and intermittently into the pulmonary vein.   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  There is no evidence of shunt by colorflow dopper and agitated saline contrast injection.   Normal thoracic and ascending aorta.  The patient tolerated the procedure well and went on to DCCV.  Signed: Armanda Magic, MD [facility]     Electrical Cardioversion Procedure Note Charles Wyatt [id number] Sep 15, 1971  Procedure: Electrical Cardioversion Indications:  Atrial Fibrillation  Time Out: Verified patient identification, verified procedure,medications/allergies/relevent history reviewed, required imaging and test results available.  Performed  Procedure Details  The patient was NPO after midnight. Anesthesia was administered at the beside  by Dr.Hodierne.  Cardioversion was done with synchronized biphasic defibrillation with AP pads with 150watts.  The patient converted to normal sinus rhythm. The patient tolerated the procedure well   IMPRESSION:  Successful cardioversion of atrial fibrillation    Traci Turner 08/20/2017, 9:30 AM

## 2017-08-20 NOTE — Anesthesia Preprocedure Evaluation (Signed)
Anesthesia Evaluation  Patient identified by MRN, date of birth, ID band Patient awake    Reviewed: Allergy & Precautions, H&P , NPO status , Patient's Chart, lab work & pertinent test results  Airway Mallampati: II   Neck ROM: full    Dental   Pulmonary neg pulmonary ROS,    breath sounds clear to auscultation       Cardiovascular + dysrhythmias Atrial Fibrillation + Cardiac Defibrillator  Rhythm:irregular Rate:Normal  HOCM?   Neuro/Psych    GI/Hepatic   Endo/Other  obese  Renal/GU      Musculoskeletal   Abdominal   Peds  Hematology   Anesthesia Other Findings   Reproductive/Obstetrics                             Anesthesia Physical Anesthesia Plan  ASA: III  Anesthesia Plan: MAC   Post-op Pain Management:    Induction: Intravenous  PONV Risk Score and Plan: 1 and Ondansetron, Dexamethasone, Propofol infusion and Treatment may vary due to age or medical condition  Airway Management Planned: Nasal Cannula  Additional Equipment:   Intra-op Plan:   Post-operative Plan:   Informed Consent: I have reviewed the patients History and Physical, chart, labs and discussed the procedure including the risks, benefits and alternatives for the proposed anesthesia with the patient or authorized representative who has indicated his/her understanding and acceptance.     Plan Discussed with: CRNA and Anesthesiologist  Anesthesia Plan Comments:         Anesthesia Quick Evaluation

## 2017-08-20 NOTE — Anesthesia Postprocedure Evaluation (Signed)
Anesthesia Post Note  Patient: Charles Wyatt  Procedure(s) Performed: Procedure(s) (LRB): TRANSESOPHAGEAL ECHOCARDIOGRAM (TEE) (N/A) CARDIOVERSION (N/A)     Patient location during evaluation: PACU Anesthesia Type: MAC Level of consciousness: awake and alert Pain management: pain level controlled Vital Signs Assessment: post-procedure vital signs reviewed and stable Respiratory status: spontaneous breathing (Pt on CPAP) Cardiovascular status: stable and blood pressure returned to baseline Postop Assessment: no apparent nausea or vomiting Anesthetic complications: no Comments: Pt having difficulty keeping SaO2 >90 on non-rebreather face mask.  CPAP applied and improvement was seen.  TEE revealed significant MR and dilated LA.  CXR showing increased markings when compared to prior study.  Some concern for focal infiltrate on right side.    Last Vitals:  Vitals:   08/20/17 1215 08/20/17 1220  BP: (!) 132/97   Pulse: 86 84  Resp: (!) 28 (!) 33  Temp:    SpO2: (!) 88% 97%    Last Pain:  Vitals:   08/20/17 0920  TempSrc: Oral  PainSc:                  [place]

## 2017-08-20 NOTE — Progress Notes (Signed)
Pt in no distress currently, pt states he feels like his breathing is improving.  Pt wanting to trial off bipap.  Pt placed on 6 lpm [place], sat 96%.  No distress noted, however off bipap BBSH w/ slightly increased crackles t/o.  Pt states he feels his breathing is well enough to stay off bipap for now.  Pt and family in room instructed that if pt starts to feel more SOB again, to call RN immediately so RT can be notified.

## 2017-08-20 NOTE — Progress Notes (Signed)
Patient resting in the bed at this time, patient does not appear to be in any respiratory distress at this time. Patient wearing oxygen set at 5lpm with SP02=99% with respiratory rate 15-19. Breath sounds fine crackles in lower lobes. Adjusted oxygen to 4lpm and will continue to monitor patients status.

## 2017-08-20 NOTE — Telephone Encounter (Signed)
I left a message on Charles Wyatt's voice mail that Dr. Graciela Husbands is out of town from yesterday and through the weekend- he will most likely not be able to review Charles Wyatt records until he is back at the beginning of next week. I advised her to call back with any questions.

## 2017-08-20 NOTE — Consult Note (Signed)
Name: Charles Wyatt MRN: [id number] DOB: Sep 10, 1971    ADMISSION DATE:  08/16/2017 CONSULTATION DATE:  9/21  REFERRING MD :  Dr. Felipa Evener  CHIEF COMPLAINT:  SOB  HISTORY OF PRESENT ILLNESS:  46 year old male with PMH as below, which is significant for HCM w/ ICD, diastolic CHF, and atrial fibrillation. He presented to [facility] emergency department 9/17 with complaints of palpitations and fatigue. He was found to be in atrial fibrillation with RVR and rates in the 160s. He was started on PO metoprolol and a diltiazem infusion as well as heparin infusion. Of note WBC elevated on admission.  RVR refractory to the aforementioned therapies and amiodarone load was initiated, but response was limited. He underwent synchronized cardioversion 9/21 AM. After the procedure developed respiratory distress and hypoxemia to the mid 80s on room air. He was staretd on BiPAP and given a one time dose of IV lasix. PCCM asked to evaluation.   SIGNIFICANT EVENTS  9/17 admit AF RVR 9/21 cardioversion, resp distress needing BiPAP.   STUDIES:  TEE 9/21 > LVEF 50-55%,  Mod-severe MR, LA massively dilated. No clot in atrial appendage or cavity.  LV severe asymmetric hypertrophy.   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  has a past surgical history that includes Defibrillator implantation. Prior to Admission medications   Medication Sig Start Date End Date Taking? Authorizing Provider  acetaminophen (TYLENOL) 500 MG tablet Take 1,000 mg by mouth every 6 (six) hours as needed for mild pain or headache.   Yes [provider]  metoprolol (LOPRESSOR) 50 MG tablet Take 1 tablet (50 mg total) by mouth 2 (two) times daily. 11/12/16  Yes Duke Salvia, MD   No Known Allergies  FAMILY HISTORY:  family history includes Cardiomyopathy in his other. SOCIAL HISTORY:  reports that he has never smoked. He has never used smokeless tobacco. He reports that he drinks alcohol. He reports that he does not use drugs.  REVIEW OF SYSTEMS:   Bolds are positive  Constitutional: weight loss, gain, night sweats, Fevers, chills, fatigue .  HEENT: headaches, Sore throat, sneezing, nasal congestion, post nasal drip, Difficulty swallowing, Tooth/dental problems, visual complaints visual changes, ear ache CV:  chest pain, radiates:,Orthopnea, PND, swelling in lower extremities, dizziness, palpitations, syncope.  GI  heartburn, indigestion, abdominal pain, nausea, vomiting, diarrhea, change in bowel habits, loss of appetite, bloody stools.  Resp: cough, productive:pink tinged, hemoptysis, dyspnea, chest pain, pleuritic.  Skin: rash or itching or icterus GU: dysuria, change in color of urine, urgency or frequency. flank pain, hematuria  MS: joint pain or swelling. decreased range of motion  Psych: change in mood or affect. depression or anxiety.  Neuro: difficulty with speech, weakness, numbness, ataxia   SUBJECTIVE:   VITAL SIGNS: Temp:  [97.9 F (36.6 C)-98.3 F (36.8 C)] 98.3 F (36.8 C) (09/21 0920) Pulse Rate:  [73-123] 84 (09/21 1225) Resp:  [17-33] 24 (09/21 1225) BP: (97-132)/(53-97) 126/97 (09/21 1320) SpO2:  [86 %-100 %] 100 % (09/21 1320) FiO2 (%):  [100 %] 100 % (09/21 1320) Weight:  [107.5 kg (237 lb 1.6 oz)] 107.5 kg (237 lb 1.6 oz) (09/21 0452)  PHYSICAL EXAMINATION: General:  Obese male in mild distress on BiPAP Neuro:  Alert, oriented, non-focal HEENT:  [place]/AT, PERRL, no JVD Cardiovascular:  RRR, no MRG Lungs:  Coarse crackles L>R. Cardiac wheeze R base.  Abdomen:  Soft, non-tender, non-distneded Musculoskeletal:  No acute deformity or ROM limitation Skin: Grossly intact   Recent Labs Lab 08/18/17 0651 08/19/17 0449 08/20/17 0325  NA 135 135 136  K 3.8 3.3* 3.9  CL 104 102 104  CO2 23 24 21*  BUN CREATININE 1.59* 1.45*  1.34*  GLUCOSE 140* 143* 122*    Recent Labs Lab 08/18/17 0345 08/19/17 0449 08/20/17 0325  HGB 12.4* 12.1* 11.8*  HCT 37.2* 36.9* 35.6*  WBC 13.2* 13.8* 13.3*  PLT 299 310 318   Dg Chest Port 1 View  Result Date: 08/20/2017 CLINICAL DATA:  Post endoscopic procedure.  Shortness of breath. EXAM: PORTABLE CHEST 1 VIEW COMPARISON:  August 16, 2017 FINDINGS: There is a new infiltrate in the medial right lung base. No pneumothorax. Cardiomegaly is stable. The hila and mediastinum are normal. No mediastinal air noted. The left lung is clear. No other acute abnormalities. IMPRESSION: 1. There is a new infiltrate in the medial right base. Aspiration and pneumonia should be considered. Recommend follow-up to resolution. Electronically Signed   By: Gerome Sam III M.D   On: 08/20/2017 12:22    ASSESSMENT / PLAN: Acute hypoxemic respiratory failure - considering cardiac history, pink frothy sputum, and appearance of CXR, suspect this is largely secondary to pulmonary edema, however, he did present with WBC elevation and RLL opacification is a bit denser than the left raising concern for a possible consolidation. PNA cannot be ruled out. Quick improvement with BiPAP would point more towards edema.  - Continue BiPAP with plans for 4 hours on 1 hour off if tolerated. BiPAP PRN after that.  - FiO2 wean down as much as possible while maintaining O2 sat > 92% - Diuresis per primary team, agree - Will start empiric Zosyn, PCT algorithm to foster ABX stewardship  - Assess ABG, lactic acid, BNP  Possible OSA - will need outpatient workup including polysomnogram, being followed by cardiology for this with plans for eventual workup.   HCOM, AF-RVR - management per cardiology.    Pulmonary and Critical Care Medicine [facility] Pager: [phone]  08/20/2017, 2:01 PM  Attending Addendum: I personally examined this patient and agree with plan as detailed above. 46yoM with hypertropic  cardiomyopathy, grade 2 diastolic CHF, Paroxysmal Afib, Obesity, and suspected OSA, admitted 9/17 with weakness, fatigue, palpitations, and was found to have Afib with RVR. He underwent TEE and Cardioversion today, after which he became hypoxic and had increased work of breathing and required BIPAP. At the time of my exam, he has Pox 100% on BIPAP with 100% FIO2, IPAP 16/EPAP 8. He has mild accessory muscle use. Lungs have good air movement but crackles in b/l bases. CXR on my review shows increased interstitial markings and a more dense RLL infiltrate that was not present on last CXR on 9/17. Labs reveal a persistent leukocytosis since 9/17. BNP is elevated at 1158 and is consistent with the clinical diagnosis of diastolic CHF exacerbation. He has already received Lasix IV. He reports he has had wheezing and productive cough since even before the procedure today, and he had a temp of 100.4 on 08/18/17. Therefore it is very likely that he has a pneumonia (CAP vs Aspiration) as well, although I do not see the infiltrate on the CXR from 9/17. So it presumably developed since then. Will order sputum culture, lactate, procalcitonin, and start Zosyn. Wean BIPAP down and off if possible. We will re-evaluate in the next 2-3 hours  and if not improved will transfer to ICU.   60 minutes critical care time  Milana Obey, MD Pulmonary & Critical Care Medicine

## 2017-08-20 NOTE — Progress Notes (Signed)
Patient presented with low oxygen saturation post procedure in endoscopy unit. O2 sat 86% on 4 L [place] . Pt is obviously in respiratory distress, switched to non-breather mask , 15 L . Pt stayed in high 80s oz saturation, Dr Chaney Malling made aware and C-pap to be ordered an initiated immediately. Respiratory placed pt on C-pap, O2 saturation increased to 99% on 100% O2. Pt appears in less distress and respirations unlabored , pt transported safely to [address] [place] and report given to [place], Charity fundraiser.  Vs documented  q 10 min.

## 2017-08-20 NOTE — Telephone Encounter (Signed)
Saw this and looked at chart   If he is still in hospital I will see next week Other wise we can set up for office visit in followup

## 2017-08-20 NOTE — Progress Notes (Signed)
  Echocardiogram Echocardiogram Transesophageal has been performed.  Charles Wyatt F 08/20/2017, 11:06 AM

## 2017-08-20 NOTE — Progress Notes (Signed)
fio2 weaned to 60% s/p ABG. Sterile cup given to pt w/ instructions on coughing up sputum sample once off bipap.   RN aware of above

## 2017-08-20 NOTE — Progress Notes (Signed)
Rechecked on patient, he is now off BIPAP and on 5L O2 with Pox 96-98%. Plan to continue antibiotics. Repeat BNP in AM. May need more Lasix in AM. Does not require transfer to ICU at this time. Discussed with patient that once we are able to wean him off O2, we could convert antibiotics to PO. He is eager for discharge home as soon as is safe.

## 2017-08-20 NOTE — Progress Notes (Signed)
ANTIBIOTIC CONSULT NOTE - INITIAL  Pharmacy Consult for zosyn Indication: pneumonia  No Known Allergies  Patient Measurements: Height:  (170.2 cm) Weight: 237 lb 1.6 oz (107.5 kg) IBW/kg (Calculated) : 66.1   Vital Signs: Temp: 98.3 F (36.8 C) (09/21 0920) Temp Source: Oral (09/21 0920) BP: 126/97 (09/21 1320) Pulse Rate: 84 (09/21 1225) Intake/Output from previous day: 09/20 0701 - 09/21 0700 In: 585 [P.O.:360; I.V.:225] Out: 401 [Urine:400; Stool:1] Intake/Output from this shift: Total I/O In: 250 [I.V.:250] Out: 800 [Urine:800]  Labs:  Recent Labs  08/18/17 0345 08/18/17 0651 08/19/17 0449 08/20/17 0325  WBC 13.2*  --  13.8* 13.3*  HGB 12.4*  --  12.1* 11.8*  PLT 299  --  310 318  CREATININE  --  1.59* 1.45* 1.34*   Estimated Creatinine Clearance: 81.4 mL/min (A) (by C-G formula based on SCr of 1.34 mg/dL (H)). No results for input(s): VANCOTROUGH, VANCOPEAK, VANCORANDOM, GENTTROUGH, GENTPEAK, GENTRANDOM, TOBRATROUGH, TOBRAPEAK, TOBRARND, AMIKACINPEAK, AMIKACINTROU, AMIKACIN in the last 72 hours.   Microbiology: Recent Results (from the past 720 hour(s))  MRSA PCR Screening     Status: None   Collection Time: 08/17/17  4:12 PM  Result Value Ref Range Status   MRSA by PCR NEGATIVE NEGATIVE Final    Comment:        The GeneXpert MRSA Assay (FDA approved for NASAL specimens only), is one component of a comprehensive MRSA colonization surveillance program. It is not intended to diagnose MRSA infection nor to guide or monitor treatment for MRSA infections.     Medical History: Past Medical History:  Diagnosis Date  . Atrial fibrillation (HCC)    Inappro shock  . HCM    06 19/2011 ?[facility] ?Tee to R/o subaortic membrane   . Implantable cardiac defibrillator Medtronic   . 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 Pharmacy consulted to start zosyn. -WBC= 13.3, afeb, SCr=  1.34  9/18 MRSA PCR- neg  Plan:  -Zosyn 3.375gm IV q8h -Will follow renal function, cultures and clinical progress  Harland German, Pharm D 08/20/2017 2:15 PM

## 2017-08-20 NOTE — Progress Notes (Signed)
Pt came up from endo on BiPAP 02 SATs in the high 90's. While resting in bed pt began coughing a pink frothy sputum. Bipap taken off so pt could cough.  Cardiology paged. Once coughing subsided pt placed back on bipap. 02 SATs still in the high 90's while on BiPAP and Pt denies SOB while he is on BiPAP. Will cont to monitor pt.

## 2017-08-20 NOTE — Interval H&P Note (Signed)
History and Physical Interval Note:  08/20/2017 8:03 AM  Charles Wyatt  has presented today for surgery, with the diagnosis of AFIB  The various methods of treatment have been discussed with the patient and family. After consideration of risks, benefits and other options for treatment, the patient has consented to  Procedure(s): TRANSESOPHAGEAL ECHOCARDIOGRAM (TEE) (N/A) CARDIOVERSION (N/A) as a surgical intervention .  The patient's history has been reviewed, patient examined, no change in status, stable for surgery.  I have reviewed the patient's chart and labs.  Questions were answered to the patient's satisfaction.     Armanda Magic

## 2017-08-21 ENCOUNTER — Other Ambulatory Visit: Payer: Self-pay

## 2017-08-21 DIAGNOSIS — I5033 Acute on chronic diastolic (congestive) heart failure: Secondary | ICD-10-CM

## 2017-08-21 DIAGNOSIS — J9601 Acute respiratory failure with hypoxia: Secondary | ICD-10-CM | POA: Diagnosis not present

## 2017-08-21 DIAGNOSIS — R0602 Shortness of breath: Secondary | ICD-10-CM

## 2017-08-21 LAB — BASIC METABOLIC PANEL
Anion gap: 10 (ref 5–15)
BUN: 17 mg/dL (ref 6–20)
CHLORIDE: 103 mmol/L (ref 101–111)
CO2: 24 mmol/L (ref 22–32)
CREATININE: 1.41 mg/dL — AB (ref 0.61–1.24)
Calcium: 8.4 mg/dL — ABNORMAL LOW (ref 8.9–10.3)
GFR calc Af Amer: >60 mL/min (ref >60)
GFR calc non Af Amer: 59 mL/min — ABNORMAL LOW (ref >60)
Glucose, Bld: 128 mg/dL — ABNORMAL HIGH (ref 65–99)
Potassium: 3.5 mmol/L (ref 3.5–5.1)
Sodium: 137 mmol/L (ref 135–145)

## 2017-08-21 LAB — CBC
HEMATOCRIT: 37.9 % — AB (ref 39.0–52.0)
HEMOGLOBIN: 12.6 g/dL — AB (ref 13.0–17.0)
MCH: 31.6 pg (ref 26.0–34.0)
MCHC: 33.2 g/dL (ref 30.0–36.0)
MCV: 95 fL (ref 78.0–100.0)
Platelets: 341 10*3/uL (ref 150–400)
RBC: 3.99 MIL/uL — ABNORMAL LOW (ref 4.22–5.81)
RDW: 13.3 % (ref 11.5–15.5)
WBC: 11.8 10*3/uL — ABNORMAL HIGH (ref 4.0–10.5)

## 2017-08-21 LAB — PHOSPHORUS: PHOSPHORUS: 3.7 mg/dL (ref 2.5–4.6)

## 2017-08-21 LAB — LACTIC ACID, PLASMA: Lactic Acid, Venous: 1.1 mmol/L (ref 0.5–1.9)

## 2017-08-21 LAB — BRAIN NATRIURETIC PEPTIDE: B NATRIURETIC PEPTIDE 5: 1152.6 pg/mL — AB (ref 0.0–100.0)

## 2017-08-21 LAB — MAGNESIUM: MAGNESIUM: 2 mg/dL (ref 1.7–2.4)

## 2017-08-21 MED ORDER — FUROSEMIDE 10 MG/ML IJ SOLN
40.0000 mg | Freq: Four times a day (QID) | INTRAMUSCULAR | Status: AC
Start: 2017-08-21 — End: 2017-08-22
  Administered 2017-08-21 – 2017-08-22 (×4): 40 mg via INTRAVENOUS
  Filled 2017-08-21 (×5): qty 4

## 2017-08-21 MED ORDER — AMIODARONE HCL IN DEXTROSE 360-4.14 MG/200ML-% IV SOLN
60.0000 mg/h | INTRAVENOUS | Status: AC
  Administered 2017-08-21 (×2): 60 mg/h via INTRAVENOUS
  Filled 2017-08-21 (×2): qty 200

## 2017-08-21 MED ORDER — AMIODARONE IV BOLUS ONLY 150 MG/100ML
150.0000 mg | Freq: Once | INTRAVENOUS | Status: AC
  Administered 2017-08-21: 150 mg via INTRAVENOUS
  Filled 2017-08-21: qty 100

## 2017-08-21 MED ORDER — DIGOXIN 0.25 MG/ML IJ SOLN
0.2500 mg | Freq: Every day | INTRAMUSCULAR | Status: AC
  Administered 2017-08-21 – 2017-08-22 (×2): 0.25 mg via INTRAVENOUS
  Filled 2017-08-21 (×2): qty 2

## 2017-08-21 MED ORDER — METOPROLOL TARTRATE 100 MG PO TABS
100.0000 mg | ORAL_TABLET | Freq: Two times a day (BID) | ORAL | Status: DC
  Administered 2017-08-21: 100 mg via ORAL
  Filled 2017-08-21: qty 1

## 2017-08-21 MED ORDER — AMIODARONE HCL IN DEXTROSE 360-4.14 MG/200ML-% IV SOLN
30.0000 mg/h | INTRAVENOUS | Status: DC
  Administered 2017-08-21 – 2017-08-23 (×5): 30 mg/h via INTRAVENOUS
  Filled 2017-08-21 (×5): qty 200

## 2017-08-21 MED ORDER — AMIODARONE LOAD VIA INFUSION
150.0000 mg | Freq: Once | INTRAVENOUS | Status: AC
  Administered 2017-08-21: 150 mg via INTRAVENOUS
  Filled 2017-08-21: qty 83.34

## 2017-08-21 NOTE — Progress Notes (Signed)
Name: Charles Wyatt MRN: [id number] DOB: Nov 24, 1971    ADMISSION DATE:  08/16/2017 CONSULTATION DATE:  9/21  REFERRING MD :  Dr. Felipa Evener  CHIEF COMPLAINT:  SOB  HISTORY OF PRESENT ILLNESS:  46 yowm  never smoker with   HCM w/ ICD, diastolic CHF, and atrial fibrillation. He presented to [facility] emergency department 9/17 with complaints of palpitations and fatigue. He was found to be in atrial fibrillation with RVR and rates in the 160s. He was started on PO metoprolol and a diltiazem infusion as well as heparin infusion. Of note WBC elevated on admission.  RVR refractory to the aforementioned therapies and amiodarone load was initiated, but response was limited. He underwent synchronized cardioversion 9/21 AM. After the procedure developed respiratory distress and hypoxemia to the mid 80s on room air. He was staretd on BiPAP and given a one time dose of IV lasix. PCCM asked to evaluation.   SIGNIFICANT EVENTS  9/17 admit AF RVR 9/21 cardioversion, resp distress needing BiPAP.   STUDIES:  TEE 9/21 > LVEF 50-55%,  Mod-severe MR, LA massively dilated. No clot in atrial appendage or cavity.  LV severe asymmetric hypertrophy.  PCT  08/20/17 = 0.11    SUBJECTIVE:  Rested on bipap overnight, no more hemoptysis/ now comfortable on NP  2lpm   VITAL SIGNS: Temp:  [98.2 F (36.8 C)-99.4 F (37.4 C)] 98.4 F (36.9 C) (09/22 0820) Pulse Rate:  [74-135] 135 (09/22 0834) Resp:  [15-25] 16 (09/22 0834) BP: (97-133)/(65-97) 97/65 (09/22 0820) SpO2:  [95 %-100 %] 95 % (09/22 0834) FiO2 (%):  [40 %-100 %] 40 % (09/22 0820) Weight:  [234 lb (106.1 kg)] 234 lb (106.1 kg) (09/22 0435)    Intake/Output Summary (Last 24 hours) at 08/21/17 1233 Last data filed at 08/21/17 1200  Gross per 24 hour  Intake            162.5 ml  Output             2450 ml  Net          -2287.5 ml    PHYSICAL EXAMINATION: General:  Obese male NAD Neuro:  Alert, oriented, non-focal HEENT:  [place]/AT, PERRL, no  JVD Cardiovascular:  IRIR @ 130 , no MRG  Audible in noisy chest  Lungs:  Coarse  insp crackles L>R with scattered exp rhonchi bilaterally as well  Abdomen:  Soft, non-tender, non-distneded Musculoskeletal:  No acute deformity or ROM limitation Skin: Grossly intact   Recent Labs Lab 08/19/17 0449 08/20/17 0325 08/21/17 0711  NA 135 136 137  K 3.3* 3.9 3.5  CL 102 104 103  CO2 24 21* 24  BUN CREATININE 1.45* 1.34* 1.41*  GLUCOSE 143* 122* 128*    Recent Labs Lab 08/19/17 0449 08/20/17 0325 08/21/17 0711  HGB 12.1* 11.8* 12.6*  HCT 36.9* 35.6* 37.9*  WBC 13.8* 13.3* 11.8*  PLT 310 318 341   Dg Chest Port 1 View  Result Date: 08/20/2017 CLINICAL DATA:  Post endoscopic procedure.  Shortness of breath. EXAM: PORTABLE CHEST 1 VIEW COMPARISON:  August 16, 2017 FINDINGS: There is a new infiltrate in the medial right lung base. No pneumothorax. Cardiomegaly is stable. The hila and mediastinum are normal. No mediastinal air noted. The left lung is clear. No other acute abnormalities. IMPRESSION: 1. There is a new infiltrate in the medial right base. Aspiration and pneumonia should be considered. Recommend follow-up to resolution. Electronically Signed   By: Onalee Hua  Mayford Knife III M.D   On: 08/20/2017 12:22    ASSESSMENT / PLAN: Acute hypoxemic respiratory failure - considering cardiac history, pink frothy sputum, and appearance of CXR, suspect this is largely secondary to pulmonary edema, however, he did present with WBC elevation and RLL opacification is a bit denser than the left raising concern for a possible consolidation. PNA cannot be ruled out. Quick improvement with BiPAP would point more towards edema.  - Continue BiPAP prn  .  - FiO2 wean down as much as possible while maintaining O2 sat > 92% - Diuresis per primary team, agree -  empiric Zosyn continue for now   Possible OSA - will need outpatient workup including polysomnogram, being followed by cardiology  for this with plans for eventual workup.   HCOM, AF-RVR - management per cardiology.    I strongly favor MR related chf/ alv hem from ruptured cap from hydrostatic injury over asp pna, with acute resp failure improving and ok to use bipap prn at this point   Sandrea Hughs, MD Pulmonary and Critical Care Medicine [facility] Cell [phone] After 5:30 PM or weekends, use Beeper [phone]

## 2017-08-21 NOTE — Consult Note (Signed)
Cardiology Consultation:   Patient ID: Charles Wyatt; [id number]; June 02, 1971   Admit date: 08/16/2017 Date of Consult: 08/21/2017  Primary Care Provider: Patient, No Pcp Per Primary Cardiologist: Eden Emms Primary Electrophysiologist:  Graciela Husbands   Patient Profile:   Charles Wyatt is a 46 y.o. male with a hx of HCM, PAF, s/p ICD who is being seen today for the evaluation of uncontrolled atrial fib at the request of Dr. Diona Browner.  History of Present Illness:   Charles Wyatt is a very pleasant 46 year old man with a history of hypertrophic cardiomyopathy, strong family history of sudden cardiac death, status post ICD implantation, paroxysmal atrial fibrillation and inappropriate ICD shocks. He was admitted to the hospital approximately 5 days ago with atrial fibrillation and a rapid ventricular response, underwent cardioversion with TEE which demonstrated moderate to severe mitral regurgitation. Post procedure he developed respiratory distress and was treated with diuretics and BiPAP with fairly quick improvement. Because of a low-grade temperature he was also treated with antibiotics for possible pneumonia. His respiratory status has improved but he developed recurrent atrial fibrillation and soft blood pressures and some increasing dyspnea and cough associated with rapid A. fib. He is referred now for additional evaluation. His atrial fibrillation has been observed on interrogation of his ICD. Until now, it is not been sustained however.  Past Medical History:  Diagnosis Date  . Atrial fibrillation (HCC)    Inappro shock  . HCM    06 19/2011 ?[facility] ?Tee to R/o subaortic membrane   . Implantable cardiac defibrillator Medtronic   . 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 Defibrillator implantation     Medtronic Protecta 314 DRG       Inpatient Medications: Scheduled Meds: . apixaban  5 mg Oral BID  . furosemide  40 mg Intravenous Q6H   . metoprolol tartrate  75 mg Oral BID   Continuous Infusions: . amiodarone 60 mg/hr (08/21/17 0936)   Followed by  . amiodarone    . diltiazem (CARDIZEM) infusion 10 mg/hr (08/20/17 0410)  . piperacillin-tazobactam (ZOSYN)  IV 3.375 g (08/21/17 0617)   PRN Meds: acetaminophen, ondansetron (ZOFRAN) IV  Allergies:   No Known Allergies  Social History:   Social History   Social History  . Marital status: Single    Spouse name: N/A  . Number of children: N/A  . Years of education: N/A   Occupational History  . Not on file.   Social History Main Topics  . Smoking status: Never Smoker  . Smokeless tobacco: Never Used  . Alcohol use Yes     Comment: occasional   . Drug use: No  . Sexual activity: Not on file   Other Topics Concern  . Not on file   Social History Narrative   Denies tobacco , acohol or illicit drug use . 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 Cardiomyopathy Other        hypertensive obstructive cardiomyopathy     ROS:  Please see the history of present illness.  ROS  All other ROS reviewed and negative.     Physical Exam/Data:   Vitals:   08/21/17 0100 08/21/17 0435 08/21/17 0820 08/21/17 0834  BP:  106/77 97/65   Pulse: 81 88  (!) 135  Resp: (!) Temp:  98.2 F (36.8 C) 98.4 F (36.9 C)   TempSrc:  Axillary Axillary   SpO2: 98% 99% 100% 95%  Weight:  234 lb (106.1 kg)    Height:        Intake/Output Summary (Last 24 hours) at 08/21/17 1007 Last data filed at 08/21/17 5284  Gross per 24 hour  Intake            412.5 ml  Output             2150 ml  Net          -1737.5 ml   Filed Weights   08/19/17 0423 08/20/17 0452 08/21/17 0435  Weight: 238 lb 8 oz (108.2 kg) 237 lb 1.6 oz (107.5 kg) 234 lb (106.1 kg)   Body mass index is 36.65 kg/m.  General:  Well nourished, well developed, in no acute distress HEENT:  normal Lymph: no adenopathy Neck: 9 cm JVD Endocrine:  No thryomegaly Vascular: No carotid bruits; FA pulses 2+ bilaterally without bruits  Cardiac:  normal S1, S2; IRIR tachy; no murmur  Lungs:  clear to auscultation bilaterally, no wheezing, rhonchi or rales  Abd: soft, nontender, no hepatomegaly  Ext: no edema Musculoskeletal:  No deformities, BUE and BLE strength normal and equal Skin: warm and dry  Neuro:  CNs 2-12 intact, no focal abnormalities noted Psych:  Normal affect   EKG:  The EKG was personally reviewed and demonstrates:  Atrial fibrillation with a rapid ventricular response Telemetry:  Telemetry was personally reviewed and demonstrates:  Atrial fibrillation with a rapid ventricular response  Relevant CV Studies: TEE demonstrates moderate to severe mitral regurgitation and massive left atrial enlargement  Laboratory Data:  Chemistry Recent Labs Lab 08/19/17 0449 08/20/17 0325 08/21/17 0711  NA 135 136 137  K 3.3* 3.9 3.5  CL 102 104 103  CO2 24 21* 24  GLUCOSE 143* 122* 128*  BUN CREATININE 1.45* 1.34* 1.41*  CALCIUM 8.5* 8.5* 8.4*  GFRNONAA 57* >60 59*  GFRAA >60 >60 >60  ANIONGAP No results for input(s): PROT, ALBUMIN, AST, ALT, ALKPHOS, BILITOT in the last 168 hours. Hematology Recent Labs Lab 08/19/17 0449 08/20/17 0325 08/21/17 0711  WBC 13.8* 13.3* 11.8*  RBC 3.86* 3.71* 3.99*  HGB 12.1* 11.8* 12.6*  HCT 36.9* 35.6* 37.9*  MCV 95.6 96.0 95.0  MCH 31.3 31.8 31.6  MCHC 32.8 33.1 33.2  RDW 13.4 13.4 13.3  PLT 310 318 341   Cardiac EnzymesNo results for input(s): TROPONINI in the last 168 hours.  Recent Labs Lab 08/16/17 0839  TROPIPOC 0.04    BNP Recent Labs Lab 08/20/17 1418 08/21/17 0711  BNP 1,158.2* 1,152.6*    DDimer No results for input(s): DDIMER in the last 168 hours.  Radiology/Studies:  Dg Chest Port 1 View  Result Date: 08/20/2017 CLINICAL DATA:  Post endoscopic procedure.  Shortness of breath.  EXAM: PORTABLE CHEST 1 VIEW COMPARISON:  August 16, 2017 FINDINGS: There is a new infiltrate in the medial right lung base. No pneumothorax. Cardiomegaly is stable. The hila and mediastinum are normal. No mediastinal air noted. The left lung is clear. No other acute abnormalities. IMPRESSION: 1. There is a new infiltrate in the medial right base. Aspiration and pneumonia should be considered. Recommend follow-up to resolution. Electronically Signed   By: Gerome Sam III M.D   On: 08/20/2017 12:22    Assessment and Plan:   1. Persistent atrial fibrillation with a  rapid ventricular response - the patient has been refractory to medical therapy. He tells me that when his heart rate is below 130, he can tolerate his atrial fibrillation reasonably well. When he gets over 150, he gets increasing dyspnea. In addition to continued intravenous amiodarone, I will give him a dose of intravenous digoxin. We do not have to give him perfectly rate controlled. I would suggest 2 additional days of amiodarone and repeat his cardioversion. He had early return of atrial fibrillation. We might also consider up titration of his beta blocker. Maintenance of sinus rhythm will be very difficult long-term because of his very large left atria. 2. Acute on chronic diastolic heart failure - his symptoms are reasonably well controlled at this point. We'll continue medical therapy with diuretics and AV nodal blocking agents for rate control. 3. ICD - his device currently appears to be working normally. 4. Mitral regurgitation - I suspect he has annular dilatation and therapeutically his options may be limited. Once his heart failure is improved and he is back in sinus rhythm, it would be reasonable to repeat his echo to see if his mitral regurgitation has improved.   For questions or updates, please contact [facility] Please consult [email or web address] for contact info under Cardiology/STEMI.   Signed, Lewayne Bunting, MD    08/21/2017 10:07 AM

## 2017-08-21 NOTE — Progress Notes (Signed)
   Subjective:   Pt converted back into Afib, and HR in the 130s. No complaints this morning. Plan for TEE DCCV on Monday    Objective:  Vital signs in last 24 hours: Vitals:   08/21/17 0100 08/21/17 0435 08/21/17 0820 08/21/17 0834  BP:  106/77 97/65   Pulse: 81 88  (!) 135  Resp: (!) Temp:  98.2 F (36.8 C) 98.4 F (36.9 C)   TempSrc:  Axillary Axillary   SpO2: 98% 99% 100% 95%  Weight:  234 lb (106.1 kg)    Height:       Per Dr. Evelene Croon exam below, as verbally conveyed to me: General:pleasant male, obese, well-developed, lying in bed in no acute distress , currently on room air Cardiac: tachycardic with irregularly irregular rhythm, nl S1/S2, no murmurs, rubs or gallops  Pulm: CTAB, no wheezes or crackles, no increased work of breathing  Abd: obese, soft, NTND, bowel sounds present  Ext: warm and well perfused, no peripheral edema   Assessment/Plan:  Charles Wyatt is a 46 year old male with a history HOCM s/p ICD and paroxsymal atrial fibrillation (not on anticoagulation) who presented with weakness, fatigue and palpitations and was found to be on A. Fib with RVR in the ED.  Persistent  Afib with RVR: Patient continues to be in A. Fib with RVR with HR 100s-130s as he converted back to Afib after cardioversion  -appreciate cardiology recs --They started IV amiodarone -IV digoxin - On diltiazem gtt - Eliquis 5 mg BID  - Metoprolol  100 mg BID for rate control  - On telemetry -cardioversion re-attempt Monday  Acute hypoxic respiratory failure and possible aspiration pneumonia: noted after TEE cardioversion yesterday. Pt was initially put on Bipap and was weaned off. 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 He has been afebrile and WC trending down   -appreciate ccm recs- BiPap PRN  for now -continue zosyn  Acute on chronic diastolic heart failure -appreciate cardiology recs -continue IV diuresis 40 mg IV q6 hours  -THey have consulted EP as difficult to manage arrythmia   HOCM s/p ICD: Patient follows up with Dr. Graciela Husbands as an outpatient. - Continue metoprolol as above    F: none E: will continue to monitor and replete as needed N: HH diet   VTE ppx: Eliquis as above  Code status: Full code    Dispo: Anticipated discharge in approximately 2-3 day(s)    Signed Deneise Lever, MD, MPH IMTS [phone]

## 2017-08-21 NOTE — Progress Notes (Signed)
Progress Note  Patient Name: Charles Wyatt Date of Encounter: 08/21/2017  Primary Cardiologist: Dr. Sherryl Manges  Subjective   Intermittent cough and somewhat more short of breath now back in atrial fibrillation. No chest pain.  Inpatient Medications    Scheduled Meds: . amiodarone  400 mg Oral TID  . apixaban  5 mg Oral BID  . furosemide  40 mg Intravenous Q6H  . metoprolol tartrate  75 mg Oral BID   Continuous Infusions: . diltiazem (CARDIZEM) infusion 10 mg/hr (08/20/17 0410)  . piperacillin-tazobactam (ZOSYN)  IV 3.375 g (08/21/17 0617)   PRN Meds: acetaminophen, ondansetron (ZOFRAN) IV   Vital Signs    Vitals:   08/21/17 0100 08/21/17 0435 08/21/17 0820 08/21/17 0834  BP:  106/77 97/65   Pulse: 81 88  (!) 135  Resp: (!) Temp:  98.2 F (36.8 C) 98.4 F (36.9 C)   TempSrc:  Axillary Axillary   SpO2: 98% 99% 100% 95%  Weight:  234 lb (106.1 kg)    Height:        Intake/Output Summary (Last 24 hours) at 08/21/17 0841 Last data filed at 08/21/17 0617  Gross per 24 hour  Intake            412.5 ml  Output             1200 ml  Net           -787.5 ml   Filed Weights   08/19/17 0423 08/20/17 0452 08/21/17 0435  Weight: 238 lb 8 oz (108.2 kg) 237 lb 1.6 oz (107.5 kg) 234 lb (106.1 kg)    Telemetry    Atrial fibrillation with RVR, rhythm returned overnight. Personally reviewed.  Physical Exam   GEN: Obese male.No acute distress. Neck: No JVD. Cardiac: iIregularly irregular, 2/6 systolic murmur, no gallop. Respiratory: Mild expiratory wheeze. GI: Obese, nontender, bowel sounds present. MS: No edema; No deformity. Neuro:  Nonfocal. Psych: Alert and oriented x 3. Normal affect.  Labs    Chemistry  Recent Labs Lab 08/19/17 0449 08/20/17 0325 08/21/17 0711  NA 135 136 137  K 3.3* 3.9 3.5  CL 102 104 103  CO2 24 21* 24  GLUCOSE 143* 122* 128*  BUN CREATININE 1.45* 1.34* 1.41*  CALCIUM 8.5* 8.5* 8.4*  GFRNONAA 57* >60  59*  GFRAA >60 >60 >60  ANIONGAP Hematology  Recent Labs Lab 08/19/17 0449 08/20/17 0325 08/21/17 0711  WBC 13.8* 13.3* 11.8*  RBC 3.86* 3.71* 3.99*  HGB 12.1* 11.8* 12.6*  HCT 36.9* 35.6* 37.9*  MCV 95.6 96.0 95.0  MCH 31.3 31.8 31.6  MCHC 32.8 33.1 33.2  RDW 13.4 13.4 13.3  PLT 310 318 341    Cardiac EnzymesNo results for input(s): TROPONINI in the last 168 hours.   Recent Labs Lab 08/16/17 0839  TROPIPOC 0.04     BNP  Recent Labs Lab 08/20/17 1418 08/21/17 0711  BNP 1,158.2* 1,152.6*     Radiology    Dg Chest Port 1 View  Result Date: 08/20/2017 CLINICAL DATA:  Post endoscopic procedure.  Shortness of breath. EXAM: PORTABLE CHEST 1 VIEW COMPARISON:  August 16, 2017 FINDINGS: There is a new infiltrate in the medial right lung base. No pneumothorax. Cardiomegaly is stable. The hila and mediastinum are normal. No mediastinal air noted. The left lung is clear. No other acute abnormalities. IMPRESSION: 1. There is a new infiltrate in the  medial right base. Aspiration and pneumonia should be considered. Recommend follow-up to resolution. Electronically Signed   By: Gerome Sam III M.D   On: 08/20/2017 12:22    Cardiac Studies   TEE 08/20/2017: Study Conclusions  - Left ventricle: There was severe asymmetric hypertrophy of the   septum. Systolic function was normal. The estimated ejection   fraction was in the range of 50% to 55%. Wall motion was normal;   there were no regional wall motion abnormalities. - Aortic valve: Structurally normal valve. Trileaflet; normal   thickness leaflets. - Mitral valve: There was moderate to severe regurgitation directed   eccentrically likely from annular dilatation. No evidence of SAM.   The flow wraps around the LA with intermittent flow in the   pulmonary vein. - Left atrium: The atrium was massively dilated. No evidence of   thrombus in the atrial cavity or appendage. There was   mildintermittent  spontaneous echo contrast (&quot;smoke&quot;) in the   cavity. The appendage was morphologically a left appendage,   multilobulated, and of normal size. Emptying velocity was mildly   reduced. - Right atrium: No evidence of thrombus in the atrial cavity or   appendage. - Atrial septum: The septum bowed from left to right, consistent   with increased left atrial pressure. - Pulmonic valve: No evidence of vegetation. There was trivial   regurgitation.  Patient Profile     46 y.o. male with HOCM, Medtronic ICD in place, obesity with possible OSA, recent PAF with RVR. He underwent TEE guided cardioversion yesterday with restoration of sinus rhythm but went back into atrial fibrillation overnight on 9/22. Also having recent issues with hypoxic respiratory failure, seen by CCM temporarily on BiPAP. On antibiotics with chest x-ray showing new infiltrate medial right base.  Assessment & Plan    1. Paroxysmal atrial fibrillation with RVR. Patient has been on oral amiodarone load and underwent TEE guided cardioversion yesterday with restoration of sinus rhythm. Atrial fibrillation with RVR returned overnight and persists this morning. He is on Eliquis.  2. Hypertrophic obstructive cardiomyopathy with Medtronic ICD in place. He follows with Dr. Graciela Husbands.  3. Recent acute hypoxic respiratory failure, likely combination of pulmonary edema and also new infiltrate by chest x-ray. Required BiPAP temporarily and is currently on antibiotics. Diuresed approximately 800 cc last 24 hours on IV Lasix.  Reviewed chart and recent hospital course. Patient is back in atrial fibrillation with RVR. Plan to switch from oral amiodarone to IV infusion, continue Lopressor. Current blood pressure limits uptitration at this point. He has had recent tenuous respiratory status although did show improvement in the last 24 hours. Would continue IV Lasix with further diuresis as tolerated, also followed by CCM on antibiotics. I have  asked for EP consultation given difficulty managing his arrhythmia. May need another cardioversion, although would suggest doing this in an ICU setting in light of his respiratory status in case he needs more intensive management following sedation.  Signed, Nona Dell, MD  08/21/2017, 8:41 AM

## 2017-08-21 NOTE — Progress Notes (Signed)
Per Tele pt is back in A-fib w/rates 130-150. Pt asymptomatic at this time. Notified MD on call. Orders placed for 1 X Amio bolus. Will  Administer and continue to monitor.

## 2017-08-22 ENCOUNTER — Encounter (HOSPITAL_COMMUNITY): Payer: Self-pay | Admitting: Cardiology

## 2017-08-22 DIAGNOSIS — J9601 Acute respiratory failure with hypoxia: Secondary | ICD-10-CM

## 2017-08-22 LAB — CBC
HEMATOCRIT: 39.7 % (ref 39.0–52.0)
HEMOGLOBIN: 13.2 g/dL (ref 13.0–17.0)
MCH: 31.6 pg (ref 26.0–34.0)
MCHC: 33.2 g/dL (ref 30.0–36.0)
MCV: 95 fL (ref 78.0–100.0)
Platelets: 447 10*3/uL — ABNORMAL HIGH (ref 150–400)
RBC: 4.18 MIL/uL — ABNORMAL LOW (ref 4.22–5.81)
RDW: 13.3 % (ref 11.5–15.5)
WBC: 11.5 10*3/uL — AB (ref 4.0–10.5)

## 2017-08-22 LAB — BRAIN NATRIURETIC PEPTIDE: B NATRIURETIC PEPTIDE 5: 973.4 pg/mL — AB (ref 0.0–100.0)

## 2017-08-22 LAB — MAGNESIUM: MAGNESIUM: 1.8 mg/dL (ref 1.7–2.4)

## 2017-08-22 LAB — PROCALCITONIN: Procalcitonin: <0.1 ng/mL

## 2017-08-22 LAB — BASIC METABOLIC PANEL
Anion gap: 13 (ref 5–15)
BUN: 19 mg/dL (ref 6–20)
CHLORIDE: 98 mmol/L — AB (ref 101–111)
CO2: 26 mmol/L (ref 22–32)
Calcium: 8.5 mg/dL — ABNORMAL LOW (ref 8.9–10.3)
Creatinine, Ser: 1.63 mg/dL — ABNORMAL HIGH (ref 0.61–1.24)
GFR calc non Af Amer: 49 mL/min — ABNORMAL LOW (ref >60)
GFR, EST AFRICAN AMERICAN: 57 mL/min — AB (ref >60)
GLUCOSE: 124 mg/dL — AB (ref 65–99)
Potassium: 3 mmol/L — ABNORMAL LOW (ref 3.5–5.1)
Sodium: 137 mmol/L (ref 135–145)

## 2017-08-22 LAB — PHOSPHORUS: PHOSPHORUS: 4.9 mg/dL — AB (ref 2.5–4.6)

## 2017-08-22 MED ORDER — METOPROLOL TARTRATE 25 MG PO TABS
125.0000 mg | ORAL_TABLET | Freq: Two times a day (BID) | ORAL | Status: DC
  Administered 2017-08-22 – 2017-08-23 (×3): 125 mg via ORAL
  Filled 2017-08-22 (×4): qty 1

## 2017-08-22 MED ORDER — POTASSIUM CHLORIDE CRYS ER 20 MEQ PO TBCR
40.0000 meq | EXTENDED_RELEASE_TABLET | Freq: Two times a day (BID) | ORAL | Status: DC
  Administered 2017-08-22 – 2017-08-25 (×7): 40 meq via ORAL
  Filled 2017-08-22 (×7): qty 2

## 2017-08-22 NOTE — Progress Notes (Signed)
   Subjective:   No complaints this morning. Metoprolol increased to 125 mg BID TEE DCCV plan is tomorrow He has been off Bipap for 24 hours now.      Objective:  Vital signs in last 24 hours: Vitals:   08/22/17 0100 08/22/17 0509 08/22/17 0642 08/22/17 0816  BP: (!) 113/97 136/86  (!) 117/91  Pulse: (!) 122   (!) 139  Resp:      Temp: 97.8 F (36.6 C) 97.9 F (36.6 C)  98.1 F (36.7 C)  TempSrc: Oral Oral  Oral  SpO2: 94% 94%  97%  Weight:   227 lb (103 kg)   Height:        General:pleasant male, obese, well-developed, lying in bed in no acute distress , currently on [place] Cardiac: tachycardic with irregularly irregular rhythm, nl S1/S2, no murmurs, rubs or gallops  Pulm: CTAB, no wheezes or crackles, no increased work of breathing  Abd: obese, soft, NTND, bowel sounds present  Ext: warm and well perfused, no peripheral edema   Assessment/Plan:  Charles Wyatt is a 46 year old male with a history HOCM s/p ICD and paroxsymal atrial fibrillation (not on anticoagulation) who presented with weakness, fatigue and palpitations and was found to be on A. Fib with RVR in the ED.  Persistent  Afib with RVR: Patient continues to be in A. Fib with RVR with HR 100s-130s as he converted back to Afib after cardioversion  -appreciate cardiology recs --They started IV amiodarone -IV digoxin - On diltiazem gtt - Eliquis 5 mg BID  - Metoprolol  125 mg BID for rate control  - On telemetry -cardioversion re-attempt Monday  Acute hypoxic respiratory failure and possible aspiration pneumonia: noted after TEE cardioversion yesterday. Pt was initially put on Bipap and was weaned off. 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 He has been afebrile and WC trending down.   -appreciate ccm recs- BiPap PRN for now -We will defer  to CCM regarding Zosyn- if patient still afebrile tomorrow, then consider stopping it.   Acute on chronic diastolic heart failure -appreciate cardiology recs -continue IV diuresis 40 mg IV q6 hours  -THey have consulted EP as difficult to manage arrythmia   HOCM s/p ICD: Patient follows up with Dr. Graciela Husbands as an outpatient. - Continue metoprolol as above    F: none E: will continue to monitor and replete as needed N: HH diet   VTE ppx: Eliquis as above  Code status: Full code    Dispo: Anticipated discharge in approximately 2-3 day(s)    Signed Deneise Lever, MD, MPH IMTS [phone]

## 2017-08-22 NOTE — Progress Notes (Signed)
Progress Note  Patient Name: Charles Wyatt Date of Encounter: 08/22/2017  Primary Cardiologist: Dr. Sherryl Manges  Subjective   No chest pain or shortness of breath at rest. Does become more short of breath when he gets up and moves around and heart rate goes faster.No lightheadedness.  Inpatient Medications    Scheduled Meds: . apixaban  5 mg Oral BID  . digoxin  0.25 mg Intravenous Daily  . metoprolol tartrate  100 mg Oral BID   Continuous Infusions: . amiodarone 30 mg/hr (08/21/17 2110)  . diltiazem (CARDIZEM) infusion 10 mg/hr (08/20/17 0410)  . piperacillin-tazobactam (ZOSYN)  IV 3.375 g (08/22/17 0514)   PRN Meds: acetaminophen, ondansetron (ZOFRAN) IV   Vital Signs    Vitals:   08/21/17 2053 08/22/17 0100 08/22/17 0509 08/22/17 0642  BP: 106/79 (!) 113/97 136/86   Pulse:  (!) 122    Resp:      Temp: 98.2 F (36.8 C) 97.8 F (36.6 C) 97.9 F (36.6 C)   TempSrc: Oral Oral Oral   SpO2: 94% 94% 94%   Weight:    227 lb (103 kg)  Height:        Intake/Output Summary (Last 24 hours) at 08/22/17 0749 Last data filed at 08/22/17 0515  Gross per 24 hour  Intake                0 ml  Output             2650 ml  Net            -2650 ml   Filed Weights   08/20/17 0452 08/21/17 0435 08/22/17 0642  Weight: 237 lb 1.6 oz (107.5 kg) 234 lb (106.1 kg) 227 lb (103 kg)    Telemetry    Atrial fibrillation/flutter with RVR. Personally reviewed.  Physical Exam   GEN: Obese male.No acute distress.   Neck: No JVD. Cardiac: Irregularly irregular, 2/6 systolic murmur, no gallop.  Respiratory: Mild expiratory wheeze. GI: Obese, nontender, bowel sounds present. MS: No edema; No deformity. Neuro:  Nonfocal. Psych: Alert and oriented x 3. Normal affect.  Labs    Chemistry Recent Labs Lab 08/19/17 0449 08/20/17 0325 08/21/17 0711  NA 135 136 137  K 3.3* 3.9 3.5  CL 102 104 103  CO2 24 21* 24  GLUCOSE 143* 122* 128*  BUN CREATININE 1.45* 1.34*  1.41*  CALCIUM 8.5* 8.5* 8.4*  GFRNONAA 57* >60 59*  GFRAA >60 >60 >60  ANIONGAP Hematology Recent Labs Lab 08/19/17 0449 08/20/17 0325 08/21/17 0711  WBC 13.8* 13.3* 11.8*  RBC 3.86* 3.71* 3.99*  HGB 12.1* 11.8* 12.6*  HCT 36.9* 35.6* 37.9*  MCV 95.6 96.0 95.0  MCH 31.3 31.8 31.6  MCHC 32.8 33.1 33.2  RDW 13.4 13.4 13.3  PLT 310 318 341    Cardiac EnzymesNo results for input(s): TROPONINI in the last 168 hours.  Recent Labs Lab 08/16/17 0839  TROPIPOC 0.04     BNP Recent Labs Lab 08/20/17 1418 08/21/17 0711  BNP 1,158.2* 1,152.6*     Radiology    Dg Chest Port 1 View  Result Date: 08/20/2017 CLINICAL DATA:  Post endoscopic procedure.  Shortness of breath. EXAM: PORTABLE CHEST 1 VIEW COMPARISON:  August 16, 2017 FINDINGS: There is a new infiltrate in the medial right lung base. No pneumothorax. Cardiomegaly is stable. The hila and mediastinum are normal. No mediastinal air noted. The left lung is clear.  No other acute abnormalities. IMPRESSION: 1. There is a new infiltrate in the medial right base. Aspiration and pneumonia should be considered. Recommend follow-up to resolution. Electronically Signed   By: Gerome Sam III M.D   On: 08/20/2017 12:22    Cardiac Studies   TEE 08/20/2017: Study Conclusions  - Left ventricle: There was severe asymmetric hypertrophy of the septum. Systolic function was normal. The estimated ejection fraction was in the range of 50% to 55%. Wall motion was normal; there were no regional wall motion abnormalities. - Aortic valve: Structurally normal valve. Trileaflet; normal thickness leaflets. 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 No evidence of SAM. The flow wraps around the LA with intermittent flow in the pulmonary vein. - Left atrium: The atrium was massively dilated. No evidence of thrombus in the atrial cavity or  appendage. There was mildintermittent spontaneous echo contrast (&quot;smoke&quot;) in the cavity. The appendage was morphologically a left appendage, multilobulated, and of normal size. Emptying velocity was mildly reduced. - Right atrium: No evidence of thrombus in the atrial cavity or appendage. - Atrial septum: The septum bowed from left to right, consistent with increased left atrial pressure. - Pulmonic valve: No evidence of vegetation. There was trivial regurgitation.  Patient Profile     46 y.o. male with HOCM, Medtronic ICD in place, obesity with possible OSA, recent PAF with RVR. He underwent TEE guided cardioversion yesterday with restoration of sinus rhythm but went back into atrial fibrillation overnight on 9/22. Also having recent issues with hypoxic respiratory failure, seen by CCM temporarily on BiPAP. On antibiotics with chest x-ray showing new infiltrate medial right base.  Assessment & Plan    1. Persistent atrial fibrillation/flutter with RVR. Atrial activity looks more organized, possibly an atypical atrial flutter at this time which may be one reason why his heart rate is harder to control. He underwent TEE guided cardioversion on 9/21 with fairly quick return to atrial fibrillation. He remains on Eliquis for stroke prophylaxis and is now back on IV amiodarone. He was seen yesterday by Dr. Ladona Ridgel for EP consultation.  2. Hypertrophic obstructive cardiomyopathy with Medtronic ICD in place. He follows with Dr. Graciela Husbands.  3. Hypoxic respiratory failure, multifactorial in setting of pulmonary edema and also right lower lobe infiltrate. He is followed by the Pulmonary team and is on antibiotics with use of BiPAP as needed.  I reviewed Dr. Lubertha Basque recommendations. Plan will be to continue IV amiodarone, I will also increase metoprolol to 125 mg twice daily. Continue Eliquis. Follow-up ECG in a.m. Will keep him nothing by mouth after midnight for possibility of a  repeat cardioversion attempt tomorrow.  Signed, Nona Dell, MD  08/22/2017, 7:49 AM

## 2017-08-22 NOTE — Progress Notes (Addendum)
Name: Charles Wyatt MRN: [id number] DOB: [birth year]    ADMISSION DATE:  08/16/2017 CONSULTATION DATE:  9/21  REFERRING MD :  Dr. Felipa Evener  CHIEF COMPLAINT:  SOB  HISTORY OF PRESENT ILLNESS:  46 yowm  never smoker with   HCM w/ ICD, diastolic CHF, and atrial fibrillation. He presented to [facility] emergency department 9/17 with complaints of palpitations and fatigue. He was found to be in atrial fibrillation with RVR and rates in the 160s. He was started on PO metoprolol and a diltiazem infusion as well as heparin infusion. Of note WBC elevated on admission.  RVR refractory to the aforementioned therapies and amiodarone load was initiated, but response was limited. He underwent synchronized cardioversion 9/21 AM. After the procedure developed respiratory distress and hypoxemia to the mid 80s on room air. He was staretd on BiPAP and given a one time dose of IV lasix. PCCM asked to evaluation.   SIGNIFICANT EVENTS  9/17 admit AF RVR 9/21 cardioversion, resp distress needing BiPAP>changed to prn am 9/22 and did not need since so d/c'd 9/23   STUDIES:  TEE 9/21 > LVEF 50-55%,  Mod-severe MR, LA massively dilated. No clot in atrial appendage or cavity.  LV severe asymmetric hypertrophy.  PCT  08/20/17 = 0.11    SUBJECTIVE:  Rested off  bipap overnight, no more hemoptysis/ now comfortable on NP  3lpm  VITAL SIGNS: Temp:  [97.8 F (36.6 C)-99.2 F (37.3 C)] 98.1 F (36.7 C) (09/23 0816) Pulse Rate:  [122-139] 139 (09/23 0816) BP: (106-136)/(79-97) 117/91 (09/23 0816) SpO2:  [94 %-98 %] 97 % (09/23 0816) Weight:  [227 lb (103 kg)] 227 lb (103 kg) (09/23 4098)    Intake/Output Summary (Last 24 hours) at 08/22/17 1047 Last data filed at 08/22/17 0515  Gross per 24 hour  Intake                0 ml  Output             1700 ml  Net            -1700 ml    PHYSICAL EXAMINATION: General:  Obese male NAD Neuro:  Alert, oriented, non-focal HEENT:  [place]/AT, PERRL, no JVD Cardiovascular:   IRIR @ 100 -120 ,  II/ VI hsm Lungs:   Minimal insp/exp rhonchi bilaterally   Abdomen:  Soft, non-tender, non-distneded Musculoskeletal:  No acute deformity or ROM limitation Skin: Grossly intact   Recent Labs Lab 08/20/17 0325 08/21/17 0711 08/22/17 0609  NA 136 137 137  K 3.9 3.5 3.0*  CL 104 103 98*  CO2 21* 24 26  BUN CREATININE 1.34* 1.41* 1.63*  GLUCOSE 122* 128* 124*    Recent Labs Lab 08/20/17 0325 08/21/17 0711 08/22/17 0609  HGB 11.8* 12.6* 13.2  HCT 35.6* 37.9* 39.7  WBC 13.3* 11.8* 11.5*  PLT 318 341 447*   Dg Chest Port 1 View  Result Date: 08/20/2017 CLINICAL DATA:  Post endoscopic procedure.  Shortness of breath. EXAM: PORTABLE CHEST 1 VIEW COMPARISON:  August 16, 2017 FINDINGS: There is a new infiltrate in the medial right lung base. No pneumothorax. Cardiomegaly is stable. The hila and mediastinum are normal. No mediastinal air noted. The left lung is clear. No other acute abnormalities. IMPRESSION: 1. There is a new infiltrate in the medial right base. Aspiration and pneumonia should be considered. Recommend follow-up to resolution. Electronically Signed   By: Gerome Sam III M.D   On: 08/20/2017  12:22    ASSESSMENT / PLAN: Acute hypoxemic respiratory failure - considering cardiac history, pink frothy sputum, and appearance of CXR, suspect this is largely secondary to pulmonary edema, however, he did present with WBC elevation and RLL opacification is a bit denser than the left raising concern for a possible consolidation. PNA cannot be ruled out. Quick improvement with BiPAP would point more towards edema.  - d/c  BiPAP   .  - FiO2 wean down as much as possible while maintaining O2 sat > 92% - Diuresis per primary team  -  empiric Zosyn 9/21 >>>   Recheck cxr 9/24 and consider d/c as doubt HCAP   Possible OSA - Possible outpatient workup including polysomnogram, being followed by cardiology for this with plans for eventual workup.    HCOM, AF-RVR - management per cardiology.    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

## 2017-08-23 ENCOUNTER — Inpatient Hospital Stay (HOSPITAL_COMMUNITY): Payer: Managed Care, Other (non HMO)

## 2017-08-23 LAB — CBC
HEMATOCRIT: 39 % (ref 39.0–52.0)
HEMOGLOBIN: 13 g/dL (ref 13.0–17.0)
MCH: 31.5 pg (ref 26.0–34.0)
MCHC: 33.3 g/dL (ref 30.0–36.0)
MCV: 94.4 fL (ref 78.0–100.0)
Platelets: 443 10*3/uL — ABNORMAL HIGH (ref 150–400)
RBC: 4.13 MIL/uL — ABNORMAL LOW (ref 4.22–5.81)
RDW: 13.2 % (ref 11.5–15.5)
WBC: 11 10*3/uL — ABNORMAL HIGH (ref 4.0–10.5)

## 2017-08-23 LAB — BASIC METABOLIC PANEL
Anion gap: 10 (ref 5–15)
BUN: 19 mg/dL (ref 6–20)
CHLORIDE: 97 mmol/L — AB (ref 101–111)
CO2: 29 mmol/L (ref 22–32)
Calcium: 8.6 mg/dL — ABNORMAL LOW (ref 8.9–10.3)
Creatinine, Ser: 1.46 mg/dL — ABNORMAL HIGH (ref 0.61–1.24)
GFR calc non Af Amer: 56 mL/min — ABNORMAL LOW (ref >60)
Glucose, Bld: 118 mg/dL — ABNORMAL HIGH (ref 65–99)
Potassium: 3.2 mmol/L — ABNORMAL LOW (ref 3.5–5.1)
SODIUM: 136 mmol/L (ref 135–145)

## 2017-08-23 LAB — BRAIN NATRIURETIC PEPTIDE: B Natriuretic Peptide: 903.3 pg/mL — ABNORMAL HIGH (ref 0.0–100.0)

## 2017-08-23 LAB — MAGNESIUM: Magnesium: 2.1 mg/dL (ref 1.7–2.4)

## 2017-08-23 LAB — PHOSPHORUS: Phosphorus: 4.5 mg/dL (ref 2.5–4.6)

## 2017-08-23 MED ORDER — ORAL CARE MOUTH RINSE
15.0000 mL | Freq: Two times a day (BID) | OROMUCOSAL | Status: DC
  Administered 2017-08-23 – 2017-08-25 (×3): 15 mL via OROMUCOSAL

## 2017-08-23 MED ORDER — METOPROLOL TARTRATE 50 MG PO TABS
62.5000 mg | ORAL_TABLET | Freq: Four times a day (QID) | ORAL | Status: DC
  Administered 2017-08-23 – 2017-08-24 (×3): 62.5 mg via ORAL
  Filled 2017-08-23 (×3): qty 1

## 2017-08-23 MED ORDER — AMIODARONE HCL 200 MG PO TABS
400.0000 mg | ORAL_TABLET | Freq: Two times a day (BID) | ORAL | Status: DC
  Administered 2017-08-23 – 2017-08-25 (×5): 400 mg via ORAL
  Filled 2017-08-23 (×5): qty 2

## 2017-08-23 NOTE — Progress Notes (Signed)
Name: Charles Wyatt MRN: [id number] DOB: August 23, 1971    ADMISSION DATE:  08/16/2017 CONSULTATION DATE:  9/21  REFERRING MD :  Dr. Felipa Evener  CHIEF COMPLAINT:  SOB  HISTORY OF PRESENT ILLNESS:  46 yowm  never smoker with   HCM w/ ICD, diastolic CHF, and atrial fibrillation. He presented to [facility] emergency department 9/17 with complaints of palpitations and fatigue. He was found to be in atrial fibrillation with RVR and rates in the 160s. He was started on PO metoprolol and a diltiazem infusion as well as heparin infusion. Of note WBC elevated on admission.  RVR refractory to the aforementioned therapies and amiodarone load was initiated, but response was limited. He underwent synchronized cardioversion 9/21 AM. After the procedure developed respiratory distress and hypoxemia to the mid 80s on room air. He was staretd on BiPAP and given a one time dose of IV lasix. PCCM asked to evaluation.   SIGNIFICANT EVENTS  9/17 admit AF RVR 9/21 cardioversion, resp distress needing BiPAP>changed to prn am 9/22 and did not need since so d/c'd 9/23   STUDIES:  TEE 9/21 > LVEF 50-55%,  Mod-severe MR, LA massively dilated. No clot in atrial appendage or cavity.  LV severe asymmetric hypertrophy.  PCT  08/20/17 = 0.11    SUBJECTIVE:   No complaints.  Down to 1L O2 with SpO2 in high 90's.  CXR improved.  VITAL SIGNS: Temp:  [97.8 F (36.6 C)-98.7 F (37.1 C)] 98.5 F (36.9 C) (09/24 1118) Pulse Rate:  [75-130] 75 (09/24 1118) Resp:  [18] 18 (09/24 1118) BP: (102-135)/(68-95) 106/70 (09/24 1118) SpO2:  [93 %-100 %] 100 % (09/24 1118) Weight:  [103.5 kg (228 lb 1.6 oz)] 103.5 kg (228 lb 1.6 oz) (09/24 3086)    Intake/Output Summary (Last 24 hours) at 08/23/17 1216 Last data filed at 08/23/17 0833  Gross per 24 hour  Intake                0 ml  Output              500 ml  Net             -500 ml    PHYSICAL EXAMINATION: General:  Obese male NAD, visiting with family at bedside Neuro:   Alert, oriented, non-focal HEENT:  Carbon/AT, PERRL, no JVD Cardiovascular:  IRIR, 2/6 SEM Lungs:   Respirations even and unlabored.  CTAB Abdomen:  Soft, non-tender, non-distneded Musculoskeletal:  No acute deformity or ROM limitation Skin: Grossly intact   Recent Labs Lab 08/21/17 0711 08/22/17 0609 08/23/17 0340  NA 137 137 136  K 3.5 3.0* 3.2*  CL 103 98* 97*  CO2 BUN CREATININE 1.41* 1.63* 1.46*  GLUCOSE 128* 124* 118*    Recent Labs Lab 08/21/17 0711 08/22/17 0609 08/23/17 0340  HGB 12.6* 13.2 13.0  HCT 37.9* 39.7 39.0  WBC 11.8* 11.5* 11.0*  PLT 341 447* 443*   Dg Chest 2 View  Result Date: 08/23/2017 CLINICAL DATA:  Shortness of breath EXAM: CHEST  2 VIEW COMPARISON:  Portable chest x-ray of August 20, 2017 FINDINGS: The lungs are adequately inflated. Infiltrate in the right lower lobe is less conspicuous today. The basilar lung markings remain coarse bilaterally. The cardiac silhouette is enlarged but stable. The pulmonary vascularity is less engorged. The ICD is in stable position. The mediastinum is normal in width. The bony thorax exhibits no acute abnormality. IMPRESSION: Improved appearance of both  lungs with decreased interstitial edema and ongoing resolution of right lower lobe pneumonia. Electronically Signed   By: David  [place] M.D.   On: 08/23/2017 07:32    ASSESSMENT / PLAN:  Acute hypoxemic respiratory failure - considering cardiac history, pink frothy sputum, and appearance of CXR, suspect this is largely secondary to pulmonary edema, however, he did present with WBC elevation and RLL opacification is a bit denser than the left raising concern for a possible consolidation. PNA cannot be ruled out. Quick improvement with BiPAP would point more towards edema. - FiO2 wean down as much as possible while maintaining O2 sat > 92% (I have turned this down to 1L and told pt he can ask nursing to turn to OFF if sats remain > 92%.  Anticipate he  will be able to come off all O2 shortly). - Agree d/c abx  Possible OSA - Possible outpatient workup including polysomnogram, being followed by cardiology for this with plans for eventual workup.  HCOM, AF-RVR - management per cardiology, repeat DCCV planned   Nothing further to add.  PCCM will sign off.  Please do not hesitate to call us back if we can be of any further assistance.   Rutherford Guys, [place] [facility] Pager: [phone]  or [phone] 08/23/2017, 12:20 PM

## 2017-08-23 NOTE — Progress Notes (Signed)
 Progress Note  Patient Name: Charles Wyatt Date of Encounter: 08/23/2017  Primary Cardiologist: Dr. Klein  Subjective   Feeling OK.  Notes palpitations when heart rates are in the 120s-130s.    Inpatient Medications    Scheduled Meds: . apixaban  5 mg Oral BID  . mouth rinse  15 mL Mouth Rinse BID  . metoprolol tartrate  125 mg Oral BID  . potassium chloride  40 mEq Oral BID   Continuous Infusions: . amiodarone 30 mg/hr (08/22/17 2205)  . diltiazem (CARDIZEM) infusion 10 mg/hr (08/20/17 0410)   PRN Meds: acetaminophen, ondansetron (ZOFRAN) IV   Vital Signs    Vitals:   08/23/17 0500 08/23/17 0635 08/23/17 0736 08/23/17 0912  BP:   102/68 112/79  Pulse:      Resp:   18   Temp:   97.8 F (36.6 C)   TempSrc:   Oral   SpO2: 93%  96%   Weight:  103.5 kg (228 lb 1.6 oz)    Height:        Intake/Output Summary (Last 24 hours) at 08/23/17 1004 Last data filed at 08/23/17 0833  Gross per 24 hour  Intake                0 ml  Output              500 ml  Net             -500 ml   Filed Weights   08/21/17 0435 08/22/17 0642 08/23/17 0635  Weight: 106.1 kg (234 lb) 103 kg (227 lb) 103.5 kg (228 lb 1.6 oz)    Telemetry    Atrial fibrillation/flutter.  Rate 100s-130s - Personally Reviewed  ECG    N/a - Personally Reviewed  Physical Exam   GEN: Well-appearing.  No acute distress.   Neck: No JVD Cardiac: Tachycardic.  Irregularly irregular.  II/VI systolic murmur at the LUSB.  No rubs, or gallops.  Respiratory: Clear to auscultation bilaterally. GI: Soft, nontender, non-distended  MS: No edema; No deformity. Neuro:  Nonfocal  Psych: Normal affect   Labs    Chemistry Recent Labs Lab 08/21/17 0711 08/22/17 0609 08/23/17 0340  NA 137 137 136  K 3.5 3.0* 3.2*  CL 103 98* 97*  CO2 24 26 29  GLUCOSE 128* 124* 118*  BUN 17 19 19  CREATININE 1.41* 1.63* 1.46*  CALCIUM 8.4* 8.5* 8.6*  GFRNONAA 59* 49* 56*  GFRAA >60 57* >60  ANIONGAP 10 13 10      Hematology Recent Labs Lab 08/21/17 0711 08/22/17 0609 08/23/17 0340  WBC 11.8* 11.5* 11.0*  RBC 3.99* 4.18* 4.13*  HGB 12.6* 13.2 13.0  HCT 37.9* 39.7 39.0  MCV 95.0 95.0 94.4  MCH 31.6 31.6 31.5  MCHC 33.2 33.2 33.3  RDW 13.3 13.3 13.2  PLT 341 447* 443*    Cardiac EnzymesNo results for input(s): TROPONINI in the last 168 hours. No results for input(s): TROPIPOC in the last 168 hours.   BNP Recent Labs Lab 08/21/17 0711 08/22/17 0609 08/23/17 0340  BNP 1,152.6* 973.4* 903.3*     DDimer No results for input(s): DDIMER in the last 168 hours.   Radiology    Dg Chest 2 View  Result Date: 08/23/2017 CLINICAL DATA:  Shortness of breath EXAM: CHEST  2 VIEW COMPARISON:  Portable chest x-ray of August 20, 2017 FINDINGS: The lungs are adequately inflated. Infiltrate in the right lower lobe is less conspicuous today. The basilar   lung markings remain coarse bilaterally. The cardiac silhouette is enlarged but stable. The pulmonary vascularity is less engorged. The ICD is in stable position. The mediastinum is normal in width. The bony thorax exhibits no acute abnormality. IMPRESSION: Improved appearance of both lungs with decreased interstitial edema and ongoing resolution of right lower lobe pneumonia. Electronically Signed   By: David  [place] M.D.   On: 08/23/2017 07:32    Cardiac Studies   TEE 08/20/2017: Study Conclusions  - Left ventricle: There was severe asymmetric hypertrophy of the septum. Systolic function was normal. The estimated ejection fraction was in the range of 50% to 55%. Wall motion was normal; there were no regional wall motion abnormalities. - Aortic valve: Structurally normal valve. Trileaflet; normal thickness leaflets. 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 No evidence of SAM. The flow wraps around the LA with intermittent flow in the pulmonary vein. - Left  atrium: The atrium was massively dilated. No evidence of thrombus in the atrial cavity or appendage. There was mildintermittent spontaneous echo contrast (&quot;smoke&quot;) in the cavity. The appendage was morphologically a left appendage, multilobulated, and of normal size. Emptying velocity was mildly reduced. - Right atrium: No evidence of thrombus in the atrial cavity or appendage. - Atrial septum: The septum bowed from left to right, consistent with increased left atrial pressure. - Pulmonic valve: No evidence of vegetation. There was trivial regurgitation.  Patient Profile     47 y.o. male with HCM s/p ICD and likely OSA here with atrial fibrillation with RVR.   Assessment & Plan    # Persistent atrial fibrillation:  Mr. Drost underwent TEE/DCCV 9/21 and reverted quickly back to atrial fibrillation.  He was started on amidarone and has received 1.7g thus far.  Will switch to oral amiodarone.  Schedule is full for DCCV today.  Will plan for tomorrow.   # HCM:  ICD in place. Continue metoprolol.   His heart rate comes down after metoprolol and then increases back to the 120s-130s prior to the next dose.    # Hypoxic respiratory failure: Still requiring supplemental oxygen.  RLL pneumonia and pulmonary edema improving on CXR.  For questions or updates, please contact [facility] Please consult [email or web address] for contact info under Cardiology/STEMI.      Signed, Chilton Si, MD  08/23/2017, 10:04 AM

## 2017-08-23 NOTE — Progress Notes (Signed)
Patient's BP showed in the Low 90s (SBP) x2 at 2200.  Metoprolol was held for 40 minutes and manual BP was done at that time.  BP showed 110/72 and was given Metoprolol dose at 2307.

## 2017-08-23 NOTE — Progress Notes (Signed)
   Subjective:  No acute events overnight. Remains in atrial fibrillation with RVR. Denies chest pain, palpitations, and shortness of breath. No complaints this morning. Plan for repeat DCCV today.    Objective:  Vital signs in last 24 hours: Vitals:   08/23/17 0300 08/23/17 0353 08/23/17 0400 08/23/17 0500  BP:  108/84    Pulse:      Resp:      Temp:  97.9 F (36.6 C)    TempSrc:  Oral    SpO2: 95% 95% 94% 93%  Weight:      Height:        General: pleasant male, obese, well-developed, sitting up in bed next to wife in no acute distress  Cardiac: tachycardic with irregularly irregular rhythm, nl S1/S2, no murmurs, rubs or gallops  Pulm: CTAB, no wheezes or crackles, no increased work of breathing  Neuro: A&Ox3, able to move all 4 extremities, no focal deficits noted   Ext: warm and well perfused, no peripheral edema    Assessment/Plan:  Charles Wyatt is a 46 year old male with a history HOCM s/p ICD and paroxsymal atrial fibrillation (not on anticoagulation) who presented with weakness, fatigue and palpitations and was found to be on A. Fib with RVR in the ED.  # Persistent  Afib with RVR: Patient continues to be in A. Fib with RVR with HR 100s-120s. Currently asymptomatic and hemodynamically stable. Remains on IV amiodarone, IV digoxin, diltiazem gtt, and PO metoprolol. Plan for repeat DCCV today. - Continue IV amiodarone and IV digoxin per cardiology recs  - Metoprolol  125 mg BID for rate control  - On telemetry - Cardiology following, appreciate recommendations   # Acute hypoxic respiratory failure: Patient desatted after DCCV 9/21 and was found to have a new RLL infiltrate on CXR. He was started on Zosyn per pulmonary recommendations and on IV Lasix . Repeat CXR today with significant improvement of infiltrate. Has remained afebrile and currently satting well on room air.  - D/c Zosyn today 9/24 - Will continue to monitor WBC   # HOCM s/p ICD: Patient follows up  with Dr. Graciela Husbands as an outpatient.   F: none E: will continue to monitor and replete as needed N: NPO for DCCV --> HH diet   VTE ppx: Eliquis as above  Code status: Full code    Dispo: Anticipated discharge in approximately 2-3 day(s)    Signed Deneise Lever, MD, MPH IMTS [phone]

## 2017-08-24 ENCOUNTER — Inpatient Hospital Stay (HOSPITAL_COMMUNITY): Payer: Managed Care, Other (non HMO) | Admitting: Anesthesiology

## 2017-08-24 ENCOUNTER — Encounter (HOSPITAL_COMMUNITY): Payer: Self-pay | Admitting: *Deleted

## 2017-08-24 ENCOUNTER — Encounter (HOSPITAL_COMMUNITY)
Admission: EM | Disposition: A | Discharge status: Home / Self Care | Payer: Self-pay | Source: Home / Self Care | Attending: Internal Medicine

## 2017-08-24 HISTORY — PX: CARDIOVERSION: SHX1299

## 2017-08-24 LAB — CBC
HEMATOCRIT: 41.1 % (ref 39.0–52.0)
HEMOGLOBIN: 13.3 g/dL (ref 13.0–17.0)
MCH: 30.9 pg (ref 26.0–34.0)
MCHC: 32.4 g/dL (ref 30.0–36.0)
MCV: 95.4 fL (ref 78.0–100.0)
Platelets: 459 10*3/uL — ABNORMAL HIGH (ref 150–400)
RBC: 4.31 MIL/uL (ref 4.22–5.81)
RDW: 13.3 % (ref 11.5–15.5)
WBC: 9.8 10*3/uL (ref 4.0–10.5)

## 2017-08-24 LAB — BASIC METABOLIC PANEL
Anion gap: 9 (ref 5–15)
BUN: 18 mg/dL (ref 6–20)
CALCIUM: 8.7 mg/dL — AB (ref 8.9–10.3)
CHLORIDE: 103 mmol/L (ref 101–111)
CO2: 25 mmol/L (ref 22–32)
CREATININE: 1.34 mg/dL — AB (ref 0.61–1.24)
GFR calc non Af Amer: >60 mL/min (ref >60)
Glucose, Bld: 101 mg/dL — ABNORMAL HIGH (ref 65–99)
Potassium: 3.8 mmol/L (ref 3.5–5.1)
SODIUM: 137 mmol/L (ref 135–145)

## 2017-08-24 LAB — BRAIN NATRIURETIC PEPTIDE: B NATRIURETIC PEPTIDE 5: 978.4 pg/mL — AB (ref 0.0–100.0)

## 2017-08-24 LAB — PROCALCITONIN

## 2017-08-24 LAB — MAGNESIUM: MAGNESIUM: 2.2 mg/dL (ref 1.7–2.4)

## 2017-08-24 LAB — PHOSPHORUS: Phosphorus: 3.5 mg/dL (ref 2.5–4.6)

## 2017-08-24 SURGERY — CARDIOVERSION
Anesthesia: Monitor Anesthesia Care

## 2017-08-24 MED ORDER — SODIUM CHLORIDE 0.9 % IV SOLN
INTRAVENOUS | Status: DC | PRN
  Administered 2017-08-24: 12:00:00 via INTRAVENOUS

## 2017-08-24 MED ORDER — PROPOFOL 10 MG/ML IV BOLUS
INTRAVENOUS | Status: DC | PRN
  Administered 2017-08-24: 80 mg via INTRAVENOUS

## 2017-08-24 MED ORDER — METOPROLOL TARTRATE 100 MG PO TABS
100.0000 mg | ORAL_TABLET | Freq: Two times a day (BID) | ORAL | Status: DC
  Administered 2017-08-24 – 2017-08-25 (×2): 100 mg via ORAL
  Filled 2017-08-24 (×3): qty 1

## 2017-08-24 NOTE — Progress Notes (Signed)
Progress Note  Patient Name: Charles Wyatt Date of Encounter: 08/24/2017  Primary Cardiologist: Dr. Graciela Husbands  Subjective   Feels OK. No chest pain or shortness of breath, occasional palpitations.   Inpatient Medications    Scheduled Meds: . amiodarone  400 mg Oral BID  . apixaban  5 mg Oral BID  . mouth rinse  15 mL Mouth Rinse BID  . metoprolol tartrate  62.5 mg Oral QID  . potassium chloride  40 mEq Oral BID   Continuous Infusions: . diltiazem (CARDIZEM) infusion 10 mg/hr (08/20/17 0410)   PRN Meds: acetaminophen, ondansetron (ZOFRAN) IV   Vital Signs    Vitals:   08/23/17 1638 08/23/17 2002 08/24/17 0500 08/24/17 0823  BP: 106/76 111/79 98/68 106/86  Pulse: 88 88 68 (!) 108  Resp: Temp: 98 F (36.7 C) 98.2 F (36.8 C) 97.9 F (36.6 C) 97.6 F (36.4 C)  TempSrc: Oral Oral Oral Oral  SpO2: 95% 90% 94% 96%  Weight:   227 lb 12.8 oz (103.3 kg)   Height:        Intake/Output Summary (Last 24 hours) at 08/24/17 1002 Last data filed at 08/23/17 2000  Gross per 24 hour  Intake              240 ml  Output              200 ml  Net               40 ml   Filed Weights   08/22/17 0642 08/23/17 0635 08/24/17 0500  Weight: 227 lb (103 kg) 228 lb 1.6 oz (103.5 kg) 227 lb 12.8 oz (103.3 kg)    Telemetry    Atrial fibrillation/flutter.  Rate 100s with occasional to 120's - Personally Reviewed  ECG    N/a - Personally Reviewed  Physical Exam   GEN: Well-appearing.  No acute distress.   Neck: No JVD Cardiac: Tachycardic.  Irregularly irregular.  II/VI systolic murmur at the LUSB.  No rubs, or gallops.  Respiratory: Clear to auscultation bilaterally. GI: Soft, nontender, non-distended  MS: No edema; No deformity. Neuro:  Nonfocal  Psych: Normal affect   Labs    Chemistry  Recent Labs Lab 08/21/17 0711 08/22/17 0609 08/23/17 0340  NA 137 137 136  K 3.5 3.0* 3.2*  CL 103 98* 97*  CO2 GLUCOSE 128* 124* 118*  BUN CREATININE 1.41* 1.63* 1.46*  CALCIUM 8.4* 8.5* 8.6*  GFRNONAA 59* 49* 56*  GFRAA >60 57* >60  ANIONGAP Hematology  Recent Labs Lab 08/22/17 0609 08/23/17 0340 08/24/17 0523  WBC 11.5* 11.0* 9.8  RBC 4.18* 4.13* 4.31  HGB 13.2 13.0 13.3  HCT 39.7 39.0 41.1  MCV 95.0 94.4 95.4  MCH 31.6 31.5 30.9  MCHC 33.2 33.3 32.4  RDW 13.3 13.2 13.3  PLT 447* 443* 459*    Cardiac EnzymesNo results for input(s): TROPONINI in the last 168 hours. No results for input(s): TROPIPOC in the last 168 hours.   BNP  Recent Labs Lab 08/22/17 0609 08/23/17 0340 08/24/17 0523  BNP 973.4* 903.3* 978.4*     DDimer No results for input(s): DDIMER in the last 168 hours.   Radiology    Dg Chest 2 View  Result Date: 08/23/2017 CLINICAL DATA:  Shortness of breath EXAM: CHEST  2 VIEW COMPARISON:  Portable chest x-ray of August 20, 2017 FINDINGS: The lungs are adequately inflated. Infiltrate in the right lower lobe is less conspicuous today. The basilar lung markings remain coarse bilaterally. The cardiac silhouette is enlarged but stable. The pulmonary vascularity is less engorged. The ICD is in stable position. The mediastinum is normal in width. The bony thorax exhibits no acute abnormality. IMPRESSION: Improved appearance of both lungs with decreased interstitial edema and ongoing resolution of right lower lobe pneumonia. Electronically Signed   By: David  [place] M.D.   On: 08/23/2017 07:32    Cardiac Studies   TEE 08/20/2017: Study Conclusions  - Left ventricle: There was severe asymmetric hypertrophy of the septum. Systolic function was normal. The estimated ejection fraction was in the range of 50% to 55%. Wall motion was normal; there were no regional wall motion abnormalities. - Aortic valve: Structurally normal valve. Trileaflet; normal thickness leaflets. - Mitral valve: There was moderate to severe regurgitation directed eccentrically likely from annular  dilatation. No evidence of SAM. The flow wraps around the LA with intermittent flow in the pulmonary vein. - Left atrium: The atrium was massively dilated. No evidence of thrombus in the atrial cavity or appendage. There was mildintermittent spontaneous echo contrast (&quot;smoke&quot;) in the cavity. The appendage was morphologically a left appendage, multilobulated, and of normal size. Emptying velocity was mildly reduced. - Right atrium: No evidence of thrombus in the atrial cavity or appendage. - Atrial septum: The septum bowed from left to right, consistent with increased left atrial pressure. - Pulmonic valve: No evidence of vegetation. There was trivial regurgitation.  Patient Profile     46 y.o. male with HCM s/p ICD and likely OSA here with atrial fibrillation with RVR.   Assessment & Plan    # Persistent atrial fibrillation:  Mr. Charles Wyatt underwent TEE/DCCV 9/21 and reverted quickly back to atrial fibrillation.  He was started on amidarone and has received 2.5g thus far. Now on oral dosing. On metoprolol 62.5 mg QID. Metoprolol doses are now more evenly spaced and HR consistently around 100 with occ up to 120's. He is anticoagulated with apixaban for stroke risk reduction.  Scheduled for DCCV today.   # HCM:  ICD in place. Continue metoprolol.   Metoprolol doses are now more evenly spaced and HR consistently around 100 with occ up to 120's.   # Hypoxic respiratory failure: Still requiring supplemental oxygen.  RLL pneumonia and pulmonary edema improving on CXR. BNP 978.4, was 903.3 yesterday. Pt now on room air and no shortness of breath.   For questions or updates, please contact [facility] Please consult [email or web address] for contact info under Cardiology/STEMI.      Signed, Berton Bon, NP  08/24/2017, 10:02 AM

## 2017-08-24 NOTE — H&P (View-Only) (Signed)
Progress Note  Patient Name: Charles Wyatt Date of Encounter: 08/23/2017  Primary Cardiologist: Dr. Graciela Husbands  Subjective   Feeling OK.  Notes palpitations when heart rates are in the 120s-130s.    Inpatient Medications    Scheduled Meds: . apixaban  5 mg Oral BID  . mouth rinse  15 mL Mouth Rinse BID  . metoprolol tartrate  125 mg Oral BID  . potassium chloride  40 mEq Oral BID   Continuous Infusions: . amiodarone 30 mg/hr (08/22/17 2205)  . diltiazem (CARDIZEM) infusion 10 mg/hr (08/20/17 0410)   PRN Meds: acetaminophen, ondansetron (ZOFRAN) IV   Vital Signs    Vitals:   08/23/17 0500 08/23/17 0635 08/23/17 0736 08/23/17 0912  BP:   102/68 112/79  Pulse:      Resp:   18   Temp:   97.8 F (36.6 C)   TempSrc:   Oral   SpO2: 93%  96%   Weight:  103.5 kg (228 lb 1.6 oz)    Height:        Intake/Output Summary (Last 24 hours) at 08/23/17 1004 Last data filed at 08/23/17 0833  Gross per 24 hour  Intake                0 ml  Output              500 ml  Net             -500 ml   Filed Weights   08/21/17 0435 08/22/17 0642 08/23/17 0635  Weight: 106.1 kg (234 lb) 103 kg (227 lb) 103.5 kg (228 lb 1.6 oz)    Telemetry    Atrial fibrillation/flutter.  Rate 100s-130s - Personally Reviewed  ECG    N/a - Personally Reviewed  Physical Exam   GEN: Well-appearing.  No acute distress.   Neck: No JVD Cardiac: Tachycardic.  Irregularly irregular.  II/VI systolic murmur at the LUSB.  No rubs, or gallops.  Respiratory: Clear to auscultation bilaterally. GI: Soft, nontender, non-distended  MS: No edema; No deformity. Neuro:  Nonfocal  Psych: Normal affect   Labs    Chemistry Recent Labs Lab 08/21/17 0711 08/22/17 0609 08/23/17 0340  NA 137 137 136  K 3.5 3.0* 3.2*  CL 103 98* 97*  CO2 GLUCOSE 128* 124* 118*  BUN CREATININE 1.41* 1.63* 1.46*  CALCIUM 8.4* 8.5* 8.6*  GFRNONAA 59* 49* 56*  GFRAA >60 57* >60  ANIONGAP Hematology Recent Labs Lab 08/21/17 0711 08/22/17 0609 08/23/17 0340  WBC 11.8* 11.5* 11.0*  RBC 3.99* 4.18* 4.13*  HGB 12.6* 13.2 13.0  HCT 37.9* 39.7 39.0  MCV 95.0 95.0 94.4  MCH 31.6 31.6 31.5  MCHC 33.2 33.2 33.3  RDW 13.3 13.3 13.2  PLT 341 447* 443*    Cardiac EnzymesNo results for input(s): TROPONINI in the last 168 hours. No results for input(s): TROPIPOC in the last 168 hours.   BNP Recent Labs Lab 08/21/17 0711 08/22/17 0609 08/23/17 0340  BNP 1,152.6* 973.4* 903.3*     DDimer No results for input(s): DDIMER in the last 168 hours.   Radiology    Dg Chest 2 View  Result Date: 08/23/2017 CLINICAL DATA:  Shortness of breath EXAM: CHEST  2 VIEW COMPARISON:  Portable chest x-ray of August 20, 2017 FINDINGS: The lungs are adequately inflated. Infiltrate in the right lower lobe is less conspicuous today. The basilar  lung markings remain coarse bilaterally. The cardiac silhouette is enlarged but stable. The pulmonary vascularity is less engorged. The ICD is in stable position. The mediastinum is normal in width. The bony thorax exhibits no acute abnormality. IMPRESSION: Improved appearance of both lungs with decreased interstitial edema and ongoing resolution of right lower lobe pneumonia. Electronically Signed   By: David  [place] M.D.   On: 08/23/2017 07:32    Cardiac Studies   TEE 08/20/2017: Study Conclusions  - Left ventricle: There was severe asymmetric hypertrophy of the septum. Systolic function was normal. The estimated ejection fraction was in the range of 50% to 55%. Wall motion was normal; there were no regional wall motion abnormalities. - Aortic valve: Structurally normal valve. Trileaflet; normal thickness leaflets. 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 No evidence of SAM. The flow wraps around the LA with intermittent flow in the pulmonary vein. - Left  atrium: The atrium was massively dilated. No evidence of thrombus in the atrial cavity or appendage. There was mildintermittent spontaneous echo contrast (&quot;smoke&quot;) in the cavity. The appendage was morphologically a left appendage, multilobulated, and of normal size. Emptying velocity was mildly reduced. - Right atrium: No evidence of thrombus in the atrial cavity or appendage. - Atrial septum: The septum bowed from left to right, consistent with increased left atrial pressure. - Pulmonic valve: No evidence of vegetation. There was trivial regurgitation.  Patient Profile     46 y.o. male with HCM s/p ICD and likely OSA here with atrial fibrillation with RVR.   Assessment & Plan    # Persistent atrial fibrillation:  Mr. Drost underwent TEE/DCCV 9/21 and reverted quickly back to atrial fibrillation.  He was started on amidarone and has received 1.7g thus far.  Will switch to oral amiodarone.  Schedule is full for DCCV today.  Will plan for tomorrow.   # HCM:  ICD in place. Continue metoprolol.   His heart rate comes down after metoprolol and then increases back to the 120s-130s prior to the next dose.    # Hypoxic respiratory failure: Still requiring supplemental oxygen.  RLL pneumonia and pulmonary edema improving on CXR.  For questions or updates, please contact [facility] Please consult [email or web address] for contact info under Cardiology/STEMI.      Signed, Chilton Si, MD  08/23/2017, 10:04 AM

## 2017-08-24 NOTE — Anesthesia Preprocedure Evaluation (Addendum)
Anesthesia Evaluation  Patient identified by MRN, date of birth, ID band Patient awake    Reviewed: Allergy & Precautions, NPO status , Patient's Chart, lab work & pertinent test results, reviewed documented beta blocker date and time   Airway Mallampati: II  TM Distance: >3 FB Neck ROM: Full    Dental  (+) Teeth Intact, Dental Advisory Given   Pulmonary    breath sounds clear to auscultation       Cardiovascular hypertension,  Rhythm:Irregular Rate:Normal     Neuro/Psych    GI/Hepatic   Endo/Other  Morbid obesity  Renal/GU      Musculoskeletal   Abdominal   Peds  Hematology  (+) anemia ,   Anesthesia Other Findings   Reproductive/Obstetrics                            Anesthesia Physical Anesthesia Plan  ASA: III  Anesthesia Plan:    Post-op Pain Management:    Induction: Intravenous  PONV Risk Score and Plan:   Airway Management Planned: Mask  Additional Equipment:   Intra-op Plan:   Post-operative Plan:   Informed Consent: I have reviewed the patients History and Physical, chart, labs and discussed the procedure including the risks, benefits and alternatives for the proposed anesthesia with the patient or authorized representative who has indicated his/her understanding and acceptance.   Dental advisory given  Plan Discussed with: CRNA, Anesthesiologist and Surgeon  Anesthesia Plan Comments:        Anesthesia Quick Evaluation

## 2017-08-24 NOTE — Progress Notes (Signed)
   Subjective:  No acute events overnight. Remains in atrial fibrillation with RVR. Denies chest pain, palpitations, and shortness of breath. Reports no complaints this morning. Cardioversion planned for today, unable to go yesterday.    Objective:  Vital signs in last 24 hours: Vitals:   08/23/17 1320 08/23/17 1638 08/23/17 2002 08/24/17 0500  BP: (!) 129/99 106/76 111/79 98/68  Pulse:  88 88 68  Resp:  Temp:  98 F (36.7 C) 98.2 F (36.8 C) 97.9 F (36.6 C)  TempSrc:  Oral Oral Oral  SpO2: 97% 95% 90% 94%  Weight:    227 lb 12.8 oz (103.3 kg)  Height:        General: pleasant male, obese, well-developed, sleeping in bed in no acute distress   Cardiac: tachycardic with irregularly irregular rhythm, nl S1/S2, no murmurs, rubs or gallops  Pulm: CTAB, no wheezes or crackles, no increased work of breathing  Neuro: A&Ox3, able to move all 4 extremities, no focal deficits noted   Ext: warm and well perfused, no peripheral edema    Assessment/Plan:  Charles Wyatt is a 46 year old male with a history HOCM s/p ICD and paroxsymal atrial fibrillation (not on anticoagulation) who presented with weakness, fatigue and palpitations and was found to be on A. Fib with RVR in the ED.  # Persistent  Afib with RVR: Patient continues to be in A. Fib with RVR with HR 100s-120s. Currently asymptomatic and hemodynamically stable. Switched from IV--> PO  Amiodarone. Remains on diltiazem gtt  and PO metoprolol. Plan for repeat DCCV today (unable to have procedure yesterday). - Per cad recs: d/c IV digoxin, switched IV--> PO amiodarone. Continue diltiazem gtt and metoprolol  125 mg BID for rate control  - On telemetry - Cardiology following, appreciate recommendations   # Acute hypoxic respiratory failure: Resolved. Patient currently on room air and satting well. Remains afebrile. Uses 1-2L O2 by [place] at night.  - Will continue to monitor respiratory status    # HOCM s/p ICD: Patient  follows up with Dr. Graciela Husbands as an outpatient.   F: none E: will continue to monitor and replete as needed N: NPO for DCCV --> HH diet   VTE ppx: Eliquis as above  Code status: Full code    Dispo: Anticipated discharge in approximately 2-3 day(s)   Burna Cash, MD  Internal Medicine PGY-1  P [phone]

## 2017-08-24 NOTE — Transfer of Care (Signed)
Immediate Anesthesia Transfer of Care Note  Patient: Charles Wyatt  Procedure(s) Performed: Procedure(s): CARDIOVERSION (N/A)  Patient Location: Endoscopy Unit  Anesthesia Type:MAC  Level of Consciousness: awake, alert , oriented and patient cooperative  Airway & Oxygen Therapy: Patient Spontanous Breathing and Patient connected to nasal cannula oxygen  Post-op Assessment: Report given to RN, Post -op Vital signs reviewed and stable and Patient moving all extremities X 4  Post vital signs: Reviewed and stable  Last Vitals:  Vitals:   08/24/17 0823 08/24/17 1115  BP: 106/86 (!) 130/101  Pulse: (!) 108   Resp:  19  Temp: 36.4 C 36.7 C  SpO2: 96% 96%    Last Pain:  Vitals:   08/24/17 1115  TempSrc: Oral  PainSc:       Patients Stated Pain Goal: 0 (52/08/02 2336)  Complications: No apparent anesthesia complications

## 2017-08-24 NOTE — Interval H&P Note (Signed)
History and Physical Interval Note:  08/24/2017 10:58 AM  Charles Wyatt  has presented today for surgery, with the diagnosis of afib  The various methods of treatment have been discussed with the patient and family. After consideration of risks, benefits and other options for treatment, the patient has consented to  Procedure(s): CARDIOVERSION (N/A) as a surgical intervention .  The patient's history has been reviewed, patient examined, no change in status, stable for surgery.  I have reviewed the patient's chart and labs.  Questions were answered to the patient's satisfaction.     [employer]

## 2017-08-24 NOTE — CV Procedure (Signed)
    Electrical Cardioversion Procedure Note Charles Wyatt [id number] 1971-11-12  Procedure: Electrical Cardioversion Indications:  Atrial Fibrillation  Time Out: Verified patient identification, verified procedure,medications/allergies/relevent history reviewed, required imaging and test results available.  Performed  Procedure Details  The patient was NPO after midnight. Anesthesia was administered at the beside  by Dr. Chilton Si with propofol.  Cardioversion was performed with synchronized biphasic defibrillation via AP pads with 120 joules.  1 attempt(s) were performed.  The patient converted to normal sinus rhythm. The patient tolerated the procedure well   IMPRESSION:  Successful cardioversion of atrial fibrillation. ICD interrogated. Normal. On amiodarone. Last cardioversion was 4 days ago.  Donato Schultz 08/24/2017, 12:03 PM

## 2017-08-24 NOTE — Anesthesia Postprocedure Evaluation (Signed)
Anesthesia Post Note  Patient: Charles Wyatt  Procedure(s) Performed: Procedure(s) (LRB): CARDIOVERSION (N/A)     Patient location during evaluation: PACU Anesthesia Type: General Pain management: pain level controlled Vital Signs Assessment: post-procedure vital signs reviewed and stable Cardiovascular status: stable Anesthetic complications: no    Last Vitals:  Vitals:   08/24/17 1210 08/24/17 1250  BP: 131/81 113/78  Pulse: 66   Resp: 19 16  Temp: 36.7 C 36.4 C  SpO2: 100% 99%    Last Pain:  Vitals:   08/24/17 1250  TempSrc: Oral  PainSc:                  Charles Wyatt

## 2017-08-25 ENCOUNTER — Encounter (HOSPITAL_COMMUNITY): Payer: Self-pay | Admitting: Cardiology

## 2017-08-25 LAB — CBC
HEMATOCRIT: 41.3 % (ref 39.0–52.0)
HEMOGLOBIN: 13.1 g/dL (ref 13.0–17.0)
MCH: 30.8 pg (ref 26.0–34.0)
MCHC: 31.7 g/dL (ref 30.0–36.0)
MCV: 96.9 fL (ref 78.0–100.0)
Platelets: 494 10*3/uL — ABNORMAL HIGH (ref 150–400)
RBC: 4.26 MIL/uL (ref 4.22–5.81)
RDW: 13.5 % (ref 11.5–15.5)
WBC: 9.3 10*3/uL (ref 4.0–10.5)

## 2017-08-25 LAB — BRAIN NATRIURETIC PEPTIDE: B Natriuretic Peptide: 588.2 pg/mL — ABNORMAL HIGH (ref 0.0–100.0)

## 2017-08-25 LAB — MAGNESIUM: MAGNESIUM: 2.2 mg/dL (ref 1.7–2.4)

## 2017-08-25 LAB — PHOSPHORUS: Phosphorus: 3.6 mg/dL (ref 2.5–4.6)

## 2017-08-25 MED ORDER — POTASSIUM CHLORIDE CRYS ER 20 MEQ PO TBCR: 40.0000 meq | EXTENDED_RELEASE_TABLET | Freq: Two times a day (BID) | ORAL | 0 refills | Status: DC

## 2017-08-25 MED ORDER — APIXABAN 5 MG PO TABS: 5.0000 mg | ORAL_TABLET | Freq: Two times a day (BID) | ORAL | 3 refills | Status: DC

## 2017-08-25 MED ORDER — METOPROLOL TARTRATE 100 MG PO TABS: 100.0000 mg | ORAL_TABLET | Freq: Two times a day (BID) | ORAL | 0 refills | Status: DC

## 2017-08-25 MED ORDER — AMIODARONE HCL 200 MG PO TABS: ORAL_TABLET | ORAL | 0 refills | Status: DC

## 2017-08-25 NOTE — Discharge Summary (Signed)
Name: Charles Wyatt MRN: [id number] DOB: 30-Jul-1971 46 y.o. PCP: Patient, No Pcp Per  Date of Admission: 08/16/2017  8:14 AM Date of Discharge: 08/25/2017 Attending Physician: Burns Spain, MD  Discharge Diagnosis: 1. Atrial fibrillation with RVR   Active Problems:   OBESITY   Hypertrophic cardiomyopathy (HCC)   Implantable cardioverter-defibrillator (ICD) in situ   Atrial fibrillation with RVR   Shortness of breath   Acute respiratory failure with hypoxemia [facility])   Discharge Medications: Allergies as of 08/25/2017   No Known Allergies     Medication List    TAKE these medications   acetaminophen 500 MG tablet Commonly known as:  TYLENOL Take 1,000 mg by mouth every 6 (six) hours as needed for mild pain or headache.   amiodarone 200 MG tablet Commonly known as:  PACERONE Take 2 tablets 2 times daily until 9/27. Start taking 1 tablet everyday on 9/28.   apixaban 5 MG Tabs tablet Commonly known as:  ELIQUIS Take 1 tablet (5 mg total) by mouth 2 (two) times daily.   metoprolol tartrate 100 MG tablet Commonly known as:  LOPRESSOR Take 1 tablet (100 mg total) by mouth 2 (two) times daily. What changed:  medication strength  how much to take   potassium chloride SA 20 MEQ tablet Commonly known as:  K-DUR,KLOR-CON Take 2 tablets (40 mEq total) by mouth 2 (two) times daily.            Discharge Care Instructions        Start     Ordered   08/25/17 0000  amiodarone (PACERONE) 200 MG tablet     08/25/17 1335   08/25/17 0000  apixaban (ELIQUIS) 5 MG TABS tablet  2 times daily     08/25/17 1335   08/25/17 0000  metoprolol tartrate (LOPRESSOR) 100 MG tablet  2 times daily     08/25/17 1335   08/25/17 0000  potassium chloride SA (K-DUR,KLOR-CON) 20 MEQ tablet  2 times daily     08/25/17 1335   08/25/17 0000  Increase activity slowly     08/25/17 1336   08/25/17 0000  Diet - low sodium heart healthy     08/25/17 1336   08/25/17 0000  (HEART FAILURE  PATIENTS) Call MD:  Anytime you have any of the following symptoms: 1) 3 pound weight gain in 24 hours or 5 pounds in 1 week 2) shortness of breath, with or without a dry hacking cough 3) swelling in the hands, feet or stomach 4) if you have to sleep on extra pillows at night in order to breathe.     08/25/17 1336   08/25/17 0000  Call MD for:  difficulty breathing, headache or visual disturbances     08/25/17 1336   08/25/17 0000  Call MD for:  persistant dizziness or light-headedness     08/25/17 1336      Disposition and follow-up:   Charles Wyatt was discharged from [facility] in Stable condition.  At the hospital follow up visit please address:  1.  Please assess for ongoing chest pain, palpitations, and dyspnea on exertion. Please assess compliance with home medications.   2. Please obtain sleep study to evaluate for OSA as patient continuously desatted overnight when sleeping.   3.  Labs / imaging needed at time of follow-up: None   4.  Pending labs/ test needing follow-up: None   Follow-up Appointments: Follow-up Information    Newman Nip, NP Follow up on  09/01/2017.   Specialties:  Nurse Practitioner, Cardiology Why:  Call the clinic next week for parking instructions. Please arrive at 1:15 pm for a 1:30 pm appt. Contact information: [address] [place] [address] [phone]           Hospital Course by problem list:  Charles Wyatt is a 46 year old male with a history HOCM s/p ICD and paroxsymal atrial fibrillation (not on anticoagulation) who presented with weakness, fatigue and palpitations and was found to be on A. Fib with RVR.    1. Atrial fibrillation with RVR: Patient presented with worsening palpitations and shortness of breath. His HR was in the 140s, but he was hemodynamically stable on presentation. He was started on diltiazem drip his home metoprolol was increased, but did not convert to NSR. He was then started on PO  amiodarone with no resolution in atrial fibrillation. He underwent TEE with DCCV on 9/21 but quickly converted back to atrial fibrillation with RVR. He was then started on IV amiodarone and IV digoxin. The procedure was complicated by aspiration pneumonitis. CXR showed a new right lower lobe infiltrate and he was started on Zosyn empirically to cover for aspiration pneumonia and IV Lasix due to concern for flash pulmonary edema. Repeat CXR 2 days later showed marked improvement in RLL infiltrate and antibiotics were stopped. Patient underwent DCCV on 9/25 with no complications. He remained in NSR and was discharged in stable condition. He was started on Eliquis 5 mg BID for anticoagulation and PO amiodarone 200 mg QD. His home metoprolol was increased to 100 mg BID.   Discharge Vitals:   BP 101/70 (BP Location: Right Arm)   Pulse 65   Temp 98.4 F (36.9 C) (Oral)   Resp 16   Ht  (1.702 m)   Wt 229 lb 12.8 oz (104.2 kg)   SpO2 95%   BMI 35.99 kg/m   Pertinent Labs, Studies, and Procedures:   Cardioversion 9/21 and 9/25  CXR 9/17: IMPRESSION: Stable cardiomegaly.  No active cardiopulmonary disease.   Discharge Instructions: Discharge Instructions    (HEART FAILURE PATIENTS) Call MD:  Anytime you have any of the following symptoms: 1) 3 pound weight gain in 24 hours or 5 pounds in 1 week 2) shortness of breath, with or without a dry hacking cough 3) swelling in the hands, feet or stomach 4) if you have to sleep on extra pillows at night in order to breathe.    Complete by:  As directed    Call MD for:  difficulty breathing, headache or visual disturbances    Complete by:  As directed    Call MD for:  persistant dizziness or light-headedness    Complete by:  As directed    Diet - low sodium heart healthy    Complete by:  As directed    Increase activity slowly    Complete by:  As directed       Signed: Burna Cash, MD  Internal Medicine PGY-1  P [phone]

## 2017-08-25 NOTE — Progress Notes (Signed)
   Subjective:  No acute events overnight. Patient remained in normal sinus rhythm. Doing well this morning when seen. Denied chest pain, palpitations, shortness of breath.   Objective:  Vital signs in last 24 hours: Vitals:   08/24/17 1749 08/24/17 1959 08/25/17 0021 08/25/17 0444  BP:  105/70 110/71 118/86  Pulse:  65 73 71  Resp:  Temp: 97.9 F (36.6 C) 98.5 F (36.9 C) 97.9 F (36.6 C) 98 F (36.7 C)  TempSrc: Oral Oral Oral Oral  SpO2: 99% 97% 98% 99%  Weight:    229 lb 12.8 oz (104.2 kg)  Height:        General:pleasant male, obese, well-developed, in no acute distress Cardiac: regular rate and rhythm, nl S1/S2, no murmurs, rubs or gallops  Pulm: CTAB, no wheezes or crackles, no increased work of breathing  Abd: soft, NTND, bowel sounds present  Neuro: A&Ox3, able to move all 4 extremities, no focal deficits noted  Ext: warm and well perfused, no peripheral edema    Assessment/Plan:  Charles Wyatt is a 46 year old male with a history HOCM s/p ICD and paroxsymal atrial fibrillation (not on anticoagulation) who presented with weakness, fatigue and palpitations and was found to be on A. Fib with RVR.   # Persistent  Afib with RVR:  Underwent successful cardioversion yesterday and remain on normal sinus rhythm overnight. Currently hemodynamically stable. Per cardiology recommendations, patient is stable for discharge and will go home on PO amiodarone, PO metop, and Eliquis.  - Per cards: PO amio 400 mg BID until 9/27, then amio 200 mg QD + metop 100 BID + Eliquis 5 BID  - Cardiology will set up outpatient follow-up for patient   # Acute hypoxic respiratory failure: Resolved. Patient currently on room air and satting well. Remains afebrile. Uses 1-2L O2 by [place] at night.    - recommended outpatient sleep study to evaluate for OSA  # HOCM s/p ICD: Patient follows up with Dr. Graciela Husbands as an outpatient.   F: none E: will continue to monitor and replete as  needed N: HH diet   VTE ppx: Eliquis as above  Code status: Full code    Dispo: Anticipated discharge in approximately today.  Burna Cash, MD  Internal Medicine PGY-1  P [phone]

## 2017-08-25 NOTE — Progress Notes (Signed)
Progress Note  Patient Name: Charles Wyatt Date of Encounter: 08/25/2017  Primary Cardiologist: Dr. Graciela Wyatt  Subjective   Feeling back to baseline.  No recurrent palpitations.   Inpatient Medications    Scheduled Meds: . amiodarone  400 mg Oral BID  . apixaban  5 mg Oral BID  . mouth rinse  15 mL Mouth Rinse BID  . metoprolol tartrate  100 mg Oral BID  . potassium chloride  40 mEq Oral BID   Continuous Infusions:  PRN Meds: acetaminophen, ondansetron (ZOFRAN) IV   Vital Signs    Vitals:   08/25/17 0444 08/25/17 0736 08/25/17 0919 08/25/17 1120  BP: 118/86 108/67 114/81 101/70  Pulse: 71 66 69 65  Resp: Temp: 98 F (36.7 C) 98.4 F (36.9 C)  98.4 F (36.9 C)  TempSrc: Oral Oral  Oral  SpO2: 99% 97%  95%  Weight: 104.2 kg (229 lb 12.8 oz)     Height:        Intake/Output Summary (Last 24 hours) at 08/25/17 1129 Last data filed at 08/24/17 2130  Gross per 24 hour  Intake              340 ml  Output                0 ml  Net              340 ml   Filed Weights   08/23/17 0635 08/24/17 0500 08/25/17 0444  Weight: 103.5 kg (228 lb 1.6 oz) 103.3 kg (227 lb 12.8 oz) 104.2 kg (229 lb 12.8 oz)    Telemetry    Sinus rhythm.  No events. - Personally Reviewed  ECG    N/a - Personally Reviewed  Physical Exam   VS:  BP 101/70 (BP Location: Right Arm)   Pulse 65   Temp 98.4 F (36.9 C) (Oral)   Resp 16   Ht  (1.702 m)   Wt 104.2 kg (229 lb 12.8 oz)   SpO2 95%   BMI 35.99 kg/m  , BMI Body mass index is 35.99 kg/m. GENERAL:  Well appearing.  No acute distress. HEENT: Pupils equal round and reactive, fundi not visualized, oral mucosa unremarkable NECK:  No jugular venous distention, waveform within normal limits, carotid upstroke brisk and symmetric, no bruits LUNGS:  Clear to auscultation bilaterally HEART:  RRR.  PMI not displaced or sustained,S1 and S2 within normal limits, no S3, no S4, no clicks, no rubs, II/VI systolic murmur at the  LUSB. ABD:  Flat, positive bowel sounds normal in frequency in pitch, no bruits, no rebound, no guarding, no midline pulsatile mass, no hepatomegaly, no splenomegaly EXT:  2 plus pulses throughout, no edema, no cyanosis no clubbing SKIN:  No rashes no nodules NEURO:  Cranial nerves II through XII grossly intact, motor grossly intact throughout [facility]:  Cognitively intact, oriented to person place and time   Labs    Chemistry  Recent Labs Lab 08/22/17 0609 08/23/17 0340 08/24/17 0900  NA 137 136 137  K 3.0* 3.2* 3.8  CL 98* 97* 103  CO2 GLUCOSE 124* 118* 101*  BUN CREATININE 1.63* 1.46* 1.34*  CALCIUM 8.5* 8.6* 8.7*  GFRNONAA 49* 56* >60  GFRAA 57* >60 >60  ANIONGAP Hematology  Recent Labs Lab 08/23/17 0340 08/24/17 0523 08/25/17 0446  WBC 11.0* 9.8 9.3  RBC 4.13* 4.31  4.26  HGB 13.0 13.3 13.1  HCT 39.0 41.1 41.3  MCV 94.4 95.4 96.9  MCH 31.5 30.9 30.8  MCHC 33.3 32.4 31.7  RDW 13.2 13.3 13.5  PLT 443* 459* 494*    Cardiac EnzymesNo results for input(s): TROPONINI in the last 168 hours. No results for input(s): TROPIPOC in the last 168 hours.   BNP  Recent Labs Lab 08/23/17 0340 08/24/17 0523 08/25/17 0446  BNP 903.3* 978.4* 588.2*     DDimer No results for input(s): DDIMER in the last 168 hours.   Radiology    No results found.  Cardiac Studies   TEE 08/20/2017: Study Conclusions  - Left ventricle: There was severe asymmetric hypertrophy of the septum. Systolic function was normal. The estimated ejection fraction was in the range of 50% to 55%. Wall motion was normal; there were no regional wall motion abnormalities. - Aortic valve: Structurally normal valve. Trileaflet; normal thickness leaflets. 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 No evidence of SAM. The flow wraps around the LA with intermittent flow in  the pulmonary vein. - Left atrium: The atrium was massively dilated. No evidence of thrombus in the atrial cavity or appendage. There was mildintermittent spontaneous echo contrast (&quot;smoke&quot;) in the cavity. The appendage was morphologically a left appendage, multilobulated, and of normal size. Emptying velocity was mildly reduced. - Right atrium: No evidence of thrombus in the atrial cavity or appendage. - Atrial septum: The septum bowed from left to right, consistent with increased left atrial pressure. - Pulmonic valve: No evidence of vegetation. There was trivial regurgitation.  Patient Profile     46 y.o. male with HCM s/p ICD and likely OSA here with atrial fibrillation with RVR.   Assessment & Plan    # Persistent atrial fibrillation:  Mr. Charles Wyatt underwent TEE/DCCV 9/21 and reverted quickly back to atrial fibrillation.  He underwent repeat DCCV 9/25 and has maintained sinus rhythm.  Continue amiodaron  bid through 9/27 and then start  daily on 9/28.  This will complete a 5g load.  Continue metoprolol tartrate  bid (increase from home dose).  Continue Eliquis and potassium.   # HCM:  ICD in place. Continue metoprolol as abpve/   # Hypoxic respiratory failure: Now off supplemental oxygen.   Stable for discharge.  We will arrange outpatient follow up.   For questions or updates, please contact [facility] Please consult [email or web address] for contact info under Cardiology/STEMI.      Signed, Charles Si, MD  08/25/2017, 11:29 AM

## 2017-08-26 NOTE — Telephone Encounter (Signed)
Patient has an appointment 09/01/17 in the a-fib clinic.

## 2017-08-30 ENCOUNTER — Ambulatory Visit (HOSPITAL_COMMUNITY)
Admission: RE | Admit: 2017-08-30 | Discharge: 2017-08-30 | Disposition: A | Discharge status: Home / Self Care | Payer: Managed Care, Other (non HMO) | Source: Ambulatory Visit | Attending: Nurse Practitioner | Admitting: Nurse Practitioner

## 2017-08-30 ENCOUNTER — Encounter (HOSPITAL_COMMUNITY): Payer: Self-pay | Admitting: Nurse Practitioner

## 2017-08-30 VITALS — BP 122/84 | HR 66 | Ht 67.0 in | Wt 227.8 lb

## 2017-08-30 DIAGNOSIS — Z79899 Other long term (current) drug therapy: Secondary | ICD-10-CM | POA: Insufficient documentation

## 2017-08-30 DIAGNOSIS — Z9889 Other specified postprocedural states: Secondary | ICD-10-CM | POA: Insufficient documentation

## 2017-08-30 DIAGNOSIS — I4891 Unspecified atrial fibrillation: Secondary | ICD-10-CM

## 2017-08-30 DIAGNOSIS — I421 Obstructive hypertrophic cardiomyopathy: Secondary | ICD-10-CM | POA: Diagnosis not present

## 2017-08-30 LAB — BASIC METABOLIC PANEL
Anion gap: 9 (ref 5–15)
BUN: 23 mg/dL — AB (ref 6–20)
CO2: 21 mmol/L — ABNORMAL LOW (ref 22–32)
CREATININE: 1.4 mg/dL — AB (ref 0.61–1.24)
Calcium: 9.1 mg/dL (ref 8.9–10.3)
Chloride: 106 mmol/L (ref 101–111)
GFR calc Af Amer: >60 mL/min (ref >60)
GFR, EST NON AFRICAN AMERICAN: 59 mL/min — AB (ref >60)
Glucose, Bld: 126 mg/dL — ABNORMAL HIGH (ref 65–99)
POTASSIUM: 4.5 mmol/L (ref 3.5–5.1)
Sodium: 136 mmol/L (ref 135–145)

## 2017-08-30 NOTE — Progress Notes (Signed)
Primary Care Physician: Patient, No Pcp Per Referring Physician:   Jamis Kryder is a 46 y.o. male with a h/o HOCM with ICD, paroxysmal afib, that presented to [facility] 9/17 to 9/26 with weaklness, fatigue and palpitations and was found to be in Afib with RVR.  He had worsening palpitations and shortness of breath. His HR was in the 140s, but he was hemodynamically stable on presentation. He was started on diltiazem drip his home metoprolol was increased, but did not convert to NSR. He was then started on PO amiodarone with no resolution in atrial fibrillation. He underwent TEE with DCCV on 9/21 but quickly converted back to atrial fibrillation with RVR. He was then started on IV amiodarone and IV digoxin. The procedure was complicated by aspiration pneumonitis. CXR showed a new right lower lobe infiltrate and he was started on Zosyn empirically to cover for aspiration pneumonia and IV Lasix due to concern for flash pulmonary edema. Repeat CXR 2 days later showed marked improvement in RLL infiltrate and antibiotics were stopped. Patient underwent DCCV on 9/25 with no complications. He remained in NSR and was discharged in stable condition. He was started on Eliquis 5 mg BID for anticoagulation and PO amiodarone 200 mg QD. His home metoprolol was increased to 100 mg BID.   He is for f/u in the afib clinic today, 10/1. No issues with afib since he has been home. He is feeling sluggish he thinks due to increase  in BB. He was also placed on K+ supplementation for K+'s of 3.0/3.2. TEE showed sever aysmmetric hypertrophy of the septum, mod to sever MR, left atrium massively dilated..  Today, he denies symptoms of palpitations, chest pain, shortness of breath, orthopnea, PND, lower extremity edema, dizziness, presyncope, syncope, or neurologic sequela. The patient is tolerating medications without difficulties and is otherwise without complaint today.   Past Medical History:  Diagnosis Date  . Atrial  fibrillation (HCC)    Inappro shock  . HCM    06 19/2011 ?[facility] ?Tee to R/o subaortic membrane   . Implantable cardiac defibrillator Medtronic   . 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 CARDIOVERSION N/A 08/20/2017   Procedure: CARDIOVERSION;  Surgeon: Quintella Reichert, MD;  Location: [facility] ENDOSCOPY;  Service: Cardiovascular;  Laterality: N/A;  . CARDIOVERSION N/A 08/24/2017   Procedure: CARDIOVERSION;  Surgeon: Jake Bathe, MD;  Location: [facility] ENDOSCOPY;  Service: Cardiovascular;  Laterality: N/A;  . Defibrillator implantation     Medtronic Protecta 314 DRG  . TEE WITHOUT CARDIOVERSION N/A 08/20/2017   Procedure: TRANSESOPHAGEAL ECHOCARDIOGRAM (TEE);  Surgeon: Quintella Reichert, MD;  Location: [facility] ENDOSCOPY;  Service: Cardiovascular;  Laterality: N/A;    Current Outpatient Prescriptions  Medication Sig Dispense Refill  . acetaminophen (TYLENOL) 500 MG tablet Take 1,000 mg by mouth every 6 (six) hours as needed for mild pain or headache.    Marland Kitchen amiodarone (PACERONE) 200 MG tablet Take 200 mg by mouth daily.    Marland Kitchen apixaban (ELIQUIS) 5 MG TABS tablet Take 1 tablet (5 mg total) by mouth 2 (two) times daily. 60 tablet 3  . metoprolol tartrate (LOPRESSOR) 100 MG tablet Take 1 tablet (100 mg total) by mouth 2 (two) times daily. 60 tablet 0  . potassium chloride SA (K-DUR,KLOR-CON) 20 MEQ tablet Take 2 tablets (40 mEq total) by mouth 2 (two) times daily. 120 tablet 0   No current facility-administered medications for this encounter.  No Known Allergies  Social History   Social History  . Marital status: Single    Spouse name: N/A  . Number of children: N/A  . Years of education: N/A   Occupational History  . Not on file.   Social History Main Topics  . Smoking status: Never Smoker  . Smokeless tobacco: Never Used  . Alcohol use Yes     Comment: occasional   . Drug use: No  . Sexual activity: Not on file   Other Topics Concern    . Not on file   Social History Narrative   Denies tobacco , acohol or illicit drug use . He works as a Investment banker, corporate. He lives with his girlfriend    Family History  Problem Relation Age of Onset  . Cardiomyopathy Other        hypertensive obstructive cardiomyopathy    ROS- All systems are reviewed and negative except as per the HPI above  Physical Exam: Vitals:   08/30/17 0831  BP: 122/84  Pulse: 66  Weight: 227 lb 12.8 oz (103.3 kg)  Height:  (1.702 m)   Wt Readings from Last 3 Encounters:  08/30/17 227 lb 12.8 oz (103.3 kg)  08/25/17 229 lb 12.8 oz (104.2 kg)  11/12/16 236 lb (107 kg)    Labs: Lab Results  Component Value Date   NA 136 08/30/2017   K 4.5 08/30/2017   CL 106 08/30/2017   CO2 21 (L) 08/30/2017   GLUCOSE 126 (H) 08/30/2017   BUN 23 (H) 08/30/2017   CREATININE 1.40 (H) 08/30/2017   CALCIUM 9.1 08/30/2017   PHOS 3.6 08/25/2017   MG 2.2 08/25/2017   Lab Results  Component Value Date   INR 1.1 ratio (H) 08/13/2010   No results found for: CHOL, HDL, LDLCALC, TRIG   GEN- The patient is well appearing, alert and oriented x 3 today.   Head- normocephalic, atraumatic Eyes-  Sclera clear, conjunctiva pink Ears- hearing intact Oropharynx- clear Neck- supple, no JVP Lymph- no cervical lymphadenopathy Lungs- Clear to ausculation bilaterally, normal work of breathing Heart- Regular rate and rhythm, no murmurs, rubs or gallops, PMI not laterally displaced GI- soft, NT, ND, + BS Extremities- no clubbing, cyanosis, or edema MS- no significant deformity or atrophy Skin- no rash or lesion Psych- euthymic mood, full affect Neuro- strength and sensation are intact  EKG- SR with first degree AV block, Pr int 210 ms, qrs int 110 ms, qtc 461 ms Epic records reviewed TEEStudy Conclusions  - Left ventricle: There was severe asymmetric hypertrophy of the   septum. Systolic function was normal. The estimated ejection   fraction was  in the range of 50% to 55%. Wall motion was normal;   there were no regional wall motion abnormalities. - Aortic valve: Structurally normal valve. Trileaflet; normal   thickness leaflets. - Mitral valve: There was moderate to severe regurgitation directed   eccentrically likely from annular dilatation. No evidence of SAM.   The flow wraps around the LA with intermittent flow in the   pulmonary vein. - Left atrium: The atrium was massively dilated. No evidence of   thrombus in the atrial cavity or appendage. There was   mildintermittent spontaneous echo contrast (&quot;smoke&quot;) in the   cavity. The appendage was morphologically a left appendage,   multilobulated, and of normal size. Emptying velocity was mildly   reduced. - Right atrium: No evidence of thrombus in the atrial cavity or   appendage. - Atrial  septum: The septum bowed from left to right, consistent   with increased left atrial pressure. - Pulmonic valve: No evidence of vegetation. There was trivial   regurgitation.    Assessment and Plan: 1. Afib with RVR Successful cardioversion with loading of amiodarone In SR at 66 bpm Continue amiodarone 200 mg qd for now, probably not a good long term drug for pt due to relative young age Continue metoprolol for now at 100 mg bid, he would like to have dose reduced at some point for feeling sluggish, however, I do not want to lower at this point for fear of chances of return to SR Continue apixaban 5 mg bid for a chadsvasc score of at least 1 bmet for hypokalemia with start of supplementation in hospital   2. HOCM Per cardiology MR with mod to severe MR by TEE in afib will have to have f/u  3. Possible sleep apnea Will refer for sleep study  Will refer back to either Dr. Azucena Fallen whoever can see first  in around 2 weeks  .Elvina Sidle [facility] [address] [place], [place] [address] [phone]

## 2017-08-31 ENCOUNTER — Telehealth: Payer: Self-pay

## 2017-08-31 ENCOUNTER — Telehealth: Payer: Self-pay | Admitting: *Deleted

## 2017-08-31 DIAGNOSIS — I340 Nonrheumatic mitral (valve) insufficiency: Secondary | ICD-10-CM

## 2017-08-31 DIAGNOSIS — I4891 Unspecified atrial fibrillation: Secondary | ICD-10-CM

## 2017-08-31 NOTE — Telephone Encounter (Signed)
-----   Message from Wendall Stade, MD sent at 08/30/2017  1:21 PM EDT ----- Needs f/u echo in NSR to assess degree of LVOT gradient and MR then can f/u with me

## 2017-08-31 NOTE — Telephone Encounter (Signed)
Patient has echo on 09/06/17 and office visit with Dr. Eden Emms on 09/08/17.

## 2017-08-31 NOTE — Telephone Encounter (Signed)
-----   Message from Shona Simpson, RN sent at 08/30/2017  1:11 PM EDT ----- Regarding: sleep study Pt needs sleep study for afib.  Thanks! stacy

## 2017-08-31 NOTE — Telephone Encounter (Signed)
Left message for patient to call back. Ordered echo, sent message to scheduling to call patient to schedule appt. Will schedule patient an appt with Dr. Eden Emms once echo is scheduled.

## 2017-09-01 ENCOUNTER — Ambulatory Visit (HOSPITAL_COMMUNITY): Payer: Managed Care, Other (non HMO) | Admitting: Nurse Practitioner

## 2017-09-03 ENCOUNTER — Encounter: Payer: Self-pay | Admitting: *Deleted

## 2017-09-03 NOTE — Telephone Encounter (Signed)
Informed patient of upcoming sleep study and patient understanding was verbalized. Patient understands his sleep study is scheduled for Monday October 25 2017. Patient understands his sleep study will be done at [facility] sleep lab. Patient understands he will receive a sleep packet in a week or so. Patient understands to call if he does not receive the sleep packet in a timely manner. Patient agrees with treatment and thanked me for call. 

## 2017-09-03 NOTE — Telephone Encounter (Signed)
This encounter was created in error - please disregard.

## 2017-09-06 ENCOUNTER — Ambulatory Visit (HOSPITAL_COMMUNITY): Payer: Managed Care, Other (non HMO) | Attending: Internal Medicine

## 2017-09-06 DIAGNOSIS — I4891 Unspecified atrial fibrillation: Secondary | ICD-10-CM | POA: Diagnosis present

## 2017-09-06 DIAGNOSIS — R0600 Dyspnea, unspecified: Secondary | ICD-10-CM | POA: Insufficient documentation

## 2017-09-06 DIAGNOSIS — I422 Other hypertrophic cardiomyopathy: Secondary | ICD-10-CM | POA: Diagnosis not present

## 2017-09-06 DIAGNOSIS — I340 Nonrheumatic mitral (valve) insufficiency: Secondary | ICD-10-CM | POA: Diagnosis present

## 2017-09-06 DIAGNOSIS — I371 Nonrheumatic pulmonary valve insufficiency: Secondary | ICD-10-CM | POA: Insufficient documentation

## 2017-09-06 DIAGNOSIS — D649 Anemia, unspecified: Secondary | ICD-10-CM | POA: Insufficient documentation

## 2017-09-06 DIAGNOSIS — E669 Obesity, unspecified: Secondary | ICD-10-CM | POA: Insufficient documentation

## 2017-09-06 DIAGNOSIS — I081 Rheumatic disorders of both mitral and tricuspid valves: Secondary | ICD-10-CM | POA: Insufficient documentation

## 2017-09-07 NOTE — Progress Notes (Signed)
Patient ID: Charles Wyatt, male   DOB: 16-Jul-1971, 46 y.o.   MRN: [id number]   Charles Wyatt is seen today for F/U of HOCM and PAF . He had his AICD placed. He had marked family history of sudden death. Septal thickness of 21mm and lots of hyperenhancement on cardiac MRI. He initially felt more energetic and less dyspnic with institution of beta blockers. Since his AICD he feels that he has taken a step backwards with more fatigue and dyspnea especially going up stairs. Iniitial concern post op for RUE sweilling and venous clot but both [name] and venogrphy showed no thrombus. Continues to feel that RUE more swollen. Denies dizzyness or syncope. No SSCP.   Had AICD shock 2014 and saw SK had PAF.  03/2015  1 AT/AF episode x 2 hrs 20 mins @ 364 Started on anticoagulation by [name] 2014  However he was not compliant with beta blocker And anticoagulation     Device reprogrammed  Less than .1% PAF  2 short episodes.  Rx turned off.  Only gets Rx for HR over 200 VT/VF  Likely OSA sleep study pending   Seen in ER 12/11 for AICD shock x 3 35 J defib for VF cycle lengths 200-246ms  Told to be admitted but didn't want to Also told to restart beta blocker Episode occurred During sex.  Admits to non compliance and also was taking a weight loss supplement That likely had a stimulant in it   Interim seen [facility] for PAF rapid rate. 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  All other systems reviewed and negative  General: Affect appropriate Overweight white male HEENT: right eye ptosis  Neck supple with no adenopathy JVP normal no bruits no thyromegaly Lungs clear with no wheezing and good diaphragmatic motion Heart:  S1/S2 SEM only with valsalva   , no rub, gallop or click PMI normal Abdomen: benighn, BS positve, no tenderness, no AAA no bruit.  No HSM or HJR Distal pulses intact with no bruits No edema Neuro non-focal Skin warm and dry No muscular weakness AICD under left clavicle   Current Outpatient Prescriptions  Medication Sig Dispense Refill  . acetaminophen (TYLENOL) 500 MG tablet Take 1,000 mg by mouth every 6 (six) hours as needed for mild pain or headache.    Marland Kitchen amiodarone (PACERONE) 200 MG tablet Take 200 mg by mouth daily.    Marland Kitchen apixaban (ELIQUIS) 5 MG TABS tablet Take 1 tablet (5 mg total) by mouth 2 (two) times daily. 60 tablet 3  . metoprolol tartrate (LOPRESSOR) 100 MG tablet Take 1 tablet (100 mg total) by mouth 2 (two) times daily. 60 tablet 0  . potassium chloride SA (K-DUR,KLOR-CON) 20 MEQ tablet Take 2 tablets (40 mEq total) by mouth 2 (two) times daily. 120 tablet 0   No current facility-administered medications for this visit.     Allergies  Patient has no known allergies.  Electrocardiogram:  04/11/13  SR  rate 73  Biphasic T waves  Chronic  07/29/15  SR rate 84  Same biphasic T waves especially laterally  Assessment and Plan  HOCM:continue beta blocker non osbstructive AICD: Appropriate d/c for VF , AF Rx turned off f/u SK  PAF:  Not ideal to be on amiodarone given young age f/u SK consider ablation given issues with AICD shock for PAF as well  For now continue 200 mg daily  [id number] to have sleep study doubt he would be compliant with CPAP travels a lot    [employer]

## 2017-09-08 ENCOUNTER — Encounter: Payer: Self-pay | Admitting: Cardiovascular Disease

## 2017-09-08 ENCOUNTER — Ambulatory Visit (INDEPENDENT_AMBULATORY_CARE_PROVIDER_SITE_OTHER): Payer: Managed Care, Other (non HMO) | Admitting: Cardiovascular Disease

## 2017-09-08 VITALS — BP 112/78 | HR 66 | Ht 67.0 in | Wt 228.0 lb

## 2017-09-08 DIAGNOSIS — I480 Paroxysmal atrial fibrillation: Secondary | ICD-10-CM

## 2017-09-08 MED ORDER — AMIODARONE HCL 200 MG PO TABS: 200.0000 mg | ORAL_TABLET | Freq: Two times a day (BID) | ORAL | 3 refills | Status: DC

## 2017-09-08 NOTE — Patient Instructions (Addendum)
Medication Instructions:  Your physician has recommended you make the following change in your medication:  1-Amiodarone 200 mg by mouth daily  Labwork: NONE  Testing/Procedures: NONE  Follow-Up: Your physician recommends that you schedule a follow-up appointment in: 3 months with Dr. Graciela Husbands.  Your physician wants you to follow-up in: 6 months with Dr. Eden Emms. You will receive a reminder letter in the mail two months in advance. If you don't receive a letter, please call our office to schedule the follow-up appointment.   If you need a refill on your cardiac medications before your next appointment, please call your pharmacy.

## 2017-09-13 ENCOUNTER — Ambulatory Visit (INDEPENDENT_AMBULATORY_CARE_PROVIDER_SITE_OTHER): Payer: Self-pay | Admitting: *Deleted

## 2017-09-13 ENCOUNTER — Telehealth: Payer: Self-pay | Admitting: Cardiology

## 2017-09-13 DIAGNOSIS — Z95810 Presence of automatic (implantable) cardiac defibrillator: Secondary | ICD-10-CM

## 2017-09-13 NOTE — Telephone Encounter (Signed)
Spoke with pt and reminded pt of remote transmission that is due today. Pt verbalized understanding.   

## 2017-09-14 NOTE — Progress Notes (Signed)
Remote ICD transmission.   

## 2017-09-15 LAB — CUP PACEART REMOTE DEVICE CHECK
Battery Voltage: 2.61 V
Brady Statistic AP VP Percent: 20.23 %
Brady Statistic AS VP Percent: 0.02 %
Brady Statistic AS VS Percent: 75.2 %
Brady Statistic RA Percent Paced: 24.77 %
HighPow Impedance: 342 Ohm
HighPow Impedance: 74 Ohm
HighPow Impedance: 82 Ohm
Implantable Lead Implant Date: 20110921
Implantable Lead Implant Date: 20110921
Lead Channel Impedance Value: 361 Ohm
Lead Channel Impedance Value: 475 Ohm
Lead Channel Pacing Threshold Amplitude: 0.625 V
Lead Channel Pacing Threshold Pulse Width: 0.4 ms
Lead Channel Sensing Intrinsic Amplitude: 4.375 mV
Lead Channel Sensing Intrinsic Amplitude: 4.375 mV
Lead Channel Sensing Intrinsic Amplitude: 8.875 mV
Lead Channel Setting Sensing Sensitivity: 0.3 mV
MDC IDC LEAD LOCATION: 753859
MDC IDC LEAD LOCATION: 753860
MDC IDC MSMT LEADCHNL RA PACING THRESHOLD AMPLITUDE: 0.75 V
MDC IDC MSMT LEADCHNL RV PACING THRESHOLD PULSEWIDTH: 0.4 ms
MDC IDC MSMT LEADCHNL RV SENSING INTR AMPL: 8.875 mV
MDC IDC PG IMPLANT DT: 20110921
MDC IDC SESS DTM: 20181016025850
MDC IDC SET LEADCHNL RA PACING AMPLITUDE: 2 V
MDC IDC SET LEADCHNL RV PACING AMPLITUDE: 2.5 V
MDC IDC SET LEADCHNL RV PACING PULSEWIDTH: 0.4 ms
MDC IDC STAT BRADY AP VS PERCENT: 4.54 %
MDC IDC STAT BRADY RV PERCENT PACED: 20.26 %

## 2017-09-17 ENCOUNTER — Encounter: Payer: Self-pay | Admitting: Cardiology

## 2017-09-17 NOTE — Progress Notes (Signed)
Letter  

## 2017-09-21 ENCOUNTER — Other Ambulatory Visit: Payer: Self-pay | Admitting: Internal Medicine

## 2017-09-21 DIAGNOSIS — I422 Other hypertrophic cardiomyopathy: Secondary | ICD-10-CM

## 2017-09-21 DIAGNOSIS — I4891 Unspecified atrial fibrillation: Secondary | ICD-10-CM

## 2017-09-21 DIAGNOSIS — R000 Tachycardia, unspecified: Secondary | ICD-10-CM

## 2017-09-24 ENCOUNTER — Other Ambulatory Visit: Payer: Self-pay | Admitting: *Deleted

## 2017-09-24 ENCOUNTER — Telehealth: Payer: Self-pay | Admitting: Cardiovascular Disease

## 2017-09-24 DIAGNOSIS — I4891 Unspecified atrial fibrillation: Secondary | ICD-10-CM

## 2017-09-24 DIAGNOSIS — I422 Other hypertrophic cardiomyopathy: Secondary | ICD-10-CM

## 2017-09-24 DIAGNOSIS — E876 Hypokalemia: Secondary | ICD-10-CM

## 2017-09-24 DIAGNOSIS — R000 Tachycardia, unspecified: Secondary | ICD-10-CM

## 2017-09-24 MED ORDER — POTASSIUM CHLORIDE CRYS ER 20 MEQ PO TBCR: 40.0000 meq | EXTENDED_RELEASE_TABLET | Freq: Every day | ORAL | 3 refills | Status: DC

## 2017-09-24 MED ORDER — APIXABAN 5 MG PO TABS: 5.0000 mg | ORAL_TABLET | Freq: Two times a day (BID) | ORAL | 6 refills | Status: DC

## 2017-09-24 MED ORDER — METOPROLOL TARTRATE 100 MG PO TABS: 100.0000 mg | ORAL_TABLET | Freq: Two times a day (BID) | ORAL | 10 refills | Status: DC

## 2017-09-24 NOTE — Telephone Encounter (Signed)
Called patient about Dr. Fabio BeringNishan's recommendations. Patient verbalized understanding and will come in on 10/15/17 for BMET.

## 2017-09-24 NOTE — Telephone Encounter (Signed)
New message     Patient calling, states he is out of potassium chloride SA (K-DUR,KLOR-CON) 20 MEQ tablet and wants to know if he should continue taking this medication since there are no refills. Also wants to clarify if he can  take over the counter potassium. Please call

## 2017-09-24 NOTE — Telephone Encounter (Signed)
New message     *STAT* If patient is at the pharmacy, call can be transferred to refill team.   1. Which medications need to be refilled? (please list name of each medication and dose if known) metoprolol tartrate (LOPRESSOR) 100 MG tablet,  and apixaban (ELIQUIS) 5 MG TABS tablet  2. Which pharmacy/location (including street and city if local pharmacy) is medication to be sent to? [facility]   3. Do they need a 30 day or 90 day supply? 30

## 2017-09-24 NOTE — Telephone Encounter (Signed)
Patient is taking K-dur 40 meq BID. Patient wants to know if he needs to continue on this dose. Patient stated if he has to continue on this dose can he take potassium supplements over the counter. Patient's last BMET (08/30/17) showed potassium at 4.5. Patient was started on potassium in the hospital after his labs (08/19/17) showed his Potassium at 3.3. Will forward to Dr. Eden EmmsNishan for advisement.

## 2017-09-24 NOTE — Telephone Encounter (Signed)
Try to go to 40 once/day and check BMET in 3 weeks

## 2017-09-24 NOTE — Telephone Encounter (Signed)
Pt last saw Dr Eden EmmsNishan on 09/08/17, last labs 08/30/17 Creat 1.40, age 46, weight 103.4kg, based on specified criteria pt is on appropriate dosage of Eliquis 5mg  BID. Will refill rx.

## 2017-09-24 NOTE — Telephone Encounter (Signed)
Dose was increased to 100 mg bid in the hospital by another physician. Okay to refill under Dr Eden EmmsNishan? Please advise. Thanks, MI

## 2017-10-01 ENCOUNTER — Emergency Department (HOSPITAL_BASED_OUTPATIENT_CLINIC_OR_DEPARTMENT_OTHER): Payer: Managed Care, Other (non HMO)

## 2017-10-01 ENCOUNTER — Emergency Department (HOSPITAL_BASED_OUTPATIENT_CLINIC_OR_DEPARTMENT_OTHER)
Admission: EM | Admit: 2017-10-01 | Discharge: 2017-10-01 | Disposition: A | Discharge status: Home / Self Care | Payer: Managed Care, Other (non HMO) | Attending: Emergency Medicine | Admitting: Emergency Medicine

## 2017-10-01 ENCOUNTER — Encounter (HOSPITAL_BASED_OUTPATIENT_CLINIC_OR_DEPARTMENT_OTHER): Payer: Self-pay | Admitting: *Deleted

## 2017-10-01 DIAGNOSIS — J189 Pneumonia, unspecified organism: Secondary | ICD-10-CM | POA: Diagnosis not present

## 2017-10-01 DIAGNOSIS — R0602 Shortness of breath: Secondary | ICD-10-CM | POA: Diagnosis present

## 2017-10-01 LAB — CBC WITH DIFFERENTIAL/PLATELET
BASOS ABS: 0 10*3/uL (ref 0.0–0.1)
Basophils Relative: 0 %
EOS ABS: 0.1 10*3/uL (ref 0.0–0.7)
EOS PCT: 1 %
HCT: 39.5 % (ref 39.0–52.0)
HEMOGLOBIN: 13.6 g/dL (ref 13.0–17.0)
LYMPHS PCT: 11 %
Lymphs Abs: 1.1 10*3/uL (ref 0.7–4.0)
MCH: 32.5 pg (ref 26.0–34.0)
MCHC: 34.4 g/dL (ref 30.0–36.0)
MCV: 94.3 fL (ref 78.0–100.0)
Monocytes Absolute: 0.4 10*3/uL (ref 0.1–1.0)
Monocytes Relative: 4 %
NEUTROS PCT: 84 %
Neutro Abs: 8.3 10*3/uL — ABNORMAL HIGH (ref 1.7–7.7)
PLATELETS: 327 10*3/uL (ref 150–400)
RBC: 4.19 MIL/uL — AB (ref 4.22–5.81)
RDW: 13.4 % (ref 11.5–15.5)
WBC: 9.8 10*3/uL (ref 4.0–10.5)

## 2017-10-01 LAB — BASIC METABOLIC PANEL
ANION GAP: 9 (ref 5–15)
BUN: 17 mg/dL (ref 6–20)
CHLORIDE: 104 mmol/L (ref 101–111)
CO2: 23 mmol/L (ref 22–32)
Calcium: 8.8 mg/dL — ABNORMAL LOW (ref 8.9–10.3)
Creatinine, Ser: 1.46 mg/dL — ABNORMAL HIGH (ref 0.61–1.24)
GFR calc Af Amer: >60 mL/min (ref >60)
GFR, EST NON AFRICAN AMERICAN: 56 mL/min — AB (ref >60)
Glucose, Bld: 166 mg/dL — ABNORMAL HIGH (ref 65–99)
POTASSIUM: 3.6 mmol/L (ref 3.5–5.1)
SODIUM: 136 mmol/L (ref 135–145)

## 2017-10-01 LAB — TROPONIN I: TROPONIN I: 0.03 ng/mL — AB (ref <0.03)

## 2017-10-01 MED ORDER — DOXYCYCLINE HYCLATE 100 MG PO CAPS: 100.0000 mg | ORAL_CAPSULE | Freq: Two times a day (BID) | ORAL | 0 refills | Status: DC

## 2017-10-01 NOTE — ED Triage Notes (Signed)
The past couple of days he has had a cough and a wheeze. No distress at triage.

## 2017-10-01 NOTE — ED Notes (Signed)
Troponin 0.03, results given to ED MD 

## 2017-10-01 NOTE — ED Provider Notes (Signed)
[facility] EMERGENCY DEPARTMENT Provider Note   CSN: [id number] Arrival date & time: 10/01/17  1321     History   Chief Complaint Chief Complaint  Patient presents with  . Shortness of Breath    HPI Charles Wyatt is a 46 y.o. male.  HPI Patient presents with cough and shortness of breath.  Has had it for the last few days.  History of hypertrophic with cardiomyopathy and atrial fibrillation.  Has had a cough with minimal sputum production.  States he just felt real short of breath and felt his heart going "flipping".  Has a history of A. fib but states this feels different.  He is on anticoagulation.  No real swelling in his legs.  States he has coughed up some foamy sputum. Past Medical History:  Diagnosis Date  . Atrial fibrillation (HCC)    Inappro shock  . HCM    06 19/2011 ?[facility] ?Tee to R/o subaortic membrane   . Implantable cardiac defibrillator Medtronic   . 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 Acute respiratory failure with hypoxemia (HCC) 08/21/2017  . Shortness of breath   . Atrial fibrillation with RVR 08/16/2017  . PAF (paroxysmal atrial fibrillation) (HCC) 05/19/2013  . 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  . OBESITY 05/20/2010  . Hypertrophic cardiomyopathy (HCC) 05/20/2010  . ANEMIA 05/19/2010  . MURMUR 05/19/2010    Past Surgical History:  Procedure Laterality Date  . CARDIOVERSION N/A 08/20/2017   Procedure: CARDIOVERSION;  Surgeon: Quintella Reichert, MD;  Location: [facility] ENDOSCOPY;  Service: Cardiovascular;  Laterality: N/A;  . CARDIOVERSION N/A 08/24/2017   Procedure: CARDIOVERSION;  Surgeon: Jake Bathe, MD;  Location: [facility] ENDOSCOPY;  Service: Cardiovascular;  Laterality: N/A;  . Defibrillator implantation     Medtronic Protecta 314 DRG  . TEE WITHOUT CARDIOVERSION N/A 08/20/2017   Procedure: TRANSESOPHAGEAL ECHOCARDIOGRAM (TEE);  Surgeon: Quintella Reichert, MD;  Location: [facility] ENDOSCOPY;  Service: Cardiovascular;  Laterality: N/A;       Home Medications    Prior to Admission medications   Medication Sig Start Date End Date Taking? Authorizing Provider  acetaminophen (TYLENOL) 500 MG tablet Take 1,000 mg by mouth every 6 (six) hours as needed for mild pain or headache.    [provider]  amiodarone (PACERONE) 200 MG tablet Take 1 tablet (200 mg total) by mouth 2 (two) times daily. 09/08/17   Wendall Stade, MD  apixaban (ELIQUIS) 5 MG TABS tablet Take 1 tablet (5 mg total) by mouth 2 (two) times daily. 09/24/17   Wendall Stade, MD  doxycycline (VIBRAMYCIN) 100 MG capsule Take 1 capsule (100 mg total) by mouth 2 (two) times daily. 10/01/17   Benjiman Core, MD  metoprolol tartrate (LOPRESSOR) 100 MG tablet Take 1 tablet (100 mg total) by mouth 2 (two) times daily. 09/24/17   Wendall Stade, MD  potassium chloride SA (K-DUR,KLOR-CON) 20 MEQ tablet Take 2 tablets (40 mEq total) by mouth daily. 09/24/17   Wendall Stade, MD    Family History Family History  Problem Relation Age of Onset  . Cardiomyopathy Other        hypertensive obstructive cardiomyopathy    Social History Social History  Substance Use Topics  . Smoking status: Never Smoker  . Smokeless tobacco: Never Used  . Alcohol use Yes     Comment: occasional      Allergies   Patient has  no known allergies.   Review of Systems Review of Systems  Constitutional: Negative for appetite change.  HENT: Negative for congestion.   Respiratory: Positive for cough and shortness of breath.   Cardiovascular: Positive for palpitations. Negative for chest pain and leg swelling.  Gastrointestinal: Negative for abdominal distention.  Genitourinary: Negative for flank pain.  Musculoskeletal: Negative for back pain.  Neurological: Negative for seizures.  Hematological: Negative for adenopathy.  Psychiatric/Behavioral: Negative for confusion.     Physical  Exam Updated Vital Signs BP 125/85 (BP Location: Right Arm)   Pulse 68   Temp 98.1 F (36.7 C) (Oral)   Resp 18   Ht 5\' 7"  (1.702 m)   Wt 105.2 kg (231 lb 14.8 oz)   SpO2 98%   BMI 36.32 kg/m   Physical Exam  Constitutional: He appears well-developed.  HENT:  Head: Atraumatic.  Eyes: EOM are normal.  Neck: Neck supple.  Cardiovascular: Normal rate.   Pulmonary/Chest:  AICD to right chest wall.  Somewhat diffuse harsh breath sounds.  Abdominal: There is no tenderness.  Musculoskeletal: He exhibits no edema.  Neurological: He is alert.  Skin: Skin is warm. Capillary refill takes less than 2 seconds.     ED Treatments / Results  Labs (all labs ordered are listed, but only abnormal results are displayed) Labs Reviewed  CBC WITH DIFFERENTIAL/PLATELET - Abnormal; Notable for the following:       Result Value   RBC 4.19 (*)    Neutro Abs 8.3 (*)    All other components within normal limits  BASIC METABOLIC PANEL - Abnormal; Notable for the following:    Glucose, Bld 166 (*)    Creatinine, Ser 1.46 (*)    Calcium 8.8 (*)    GFR calc non Af Amer 56 (*)    All other components within normal limits  TROPONIN I - Abnormal; Notable for the following:    Troponin I 0.03 (*)    All other components within normal limits    EKG  EKG Interpretation  Date/Time:  Friday October 01 2017 13:37:10 EDT Ventricular Rate:  78 PR Interval:    QRS Duration: 113 QT Interval:  380 QTC Calculation: 433 R Axis:   -24 Text Interpretation:  Sinus rhythm Borderline prolonged PR interval Probable left atrial enlargement Incomplete left bundle branch block Confirmed by Benjiman Core [phone]) on 10/01/2017 3:37:42 PM       Radiology Dg Chest 2 View  Result Date: 10/01/2017 CLINICAL DATA:  Productive cough for 2 days.  Shortness of breath. EXAM: CHEST  2 VIEW COMPARISON:  Two-view chest x-ray 08/23/2017. FINDINGS: The heart is mildly enlarged. Right subclavian pacemaker/AICD is stable.  New right lower lobe pneumonia is present. The left lung is clear. The visualized soft tissues and bony thorax are unremarkable. IMPRESSION: 1. New right lower lobe pneumonia. 2. Mild cardiomegaly without failure. Electronically Signed   By: Marin Roberts M.D.   On: 10/01/2017 14:30    Procedures Procedures (including critical care time)  Medications Ordered in ED Medications - No data to display   Initial Impression / Assessment and Plan / ED Course  I have reviewed the triage vital signs and the nursing notes.  Pertinent labs & imaging results that were available during my care of the patient were reviewed by me and considered in my medical decision making (see chart for details).     Patient with shortness of breath and episodes of lightheadedness/weakness.  Pneumonia on x-ray.  AICD interrogated and  besides a low battery did not give a clear cause.  Troponin low but has been elevated like this in the past.  Doubt this is ischemic cause.  Does not appear volume overloaded.  Patient knows about his low battery.  Will discharge home with antibiotics.  Final Clinical Impressions(s) / ED Diagnoses   Final diagnoses:  Community acquired pneumonia of right lung, unspecified part of lung    New Prescriptions Discharge Medication List as of 10/01/2017  4:19 PM    START taking these medications   Details  doxycycline (VIBRAMYCIN) 100 MG capsule Take 1 capsule (100 mg total) by mouth 2 (two) times daily., Starting Fri 10/01/2017, Print         Benjiman CorePickering, Jaydien Panepinto, MD 10/01/17 [id number]

## 2017-10-04 ENCOUNTER — Telehealth: Payer: Self-pay | Admitting: Cardiology

## 2017-10-04 NOTE — Telephone Encounter (Signed)
Patient called b/c he heard his implanted device alert tone over the weekend. We received a remote transmission on 10-02-17 and it alerted that pt device reached ERI. Informed pt that we would have a scheduler call him to schedule an appt w/ MD, NP, or PA to discuss procedure and the procedure will be scheduled for another day. Pt verbalized understanding.

## 2017-10-15 ENCOUNTER — Other Ambulatory Visit: Payer: Managed Care, Other (non HMO) | Admitting: *Deleted

## 2017-10-15 ENCOUNTER — Telehealth: Payer: Self-pay | Admitting: Cardiology

## 2017-10-15 ENCOUNTER — Ambulatory Visit (INDEPENDENT_AMBULATORY_CARE_PROVIDER_SITE_OTHER): Payer: Self-pay | Admitting: *Deleted

## 2017-10-15 DIAGNOSIS — Z95810 Presence of automatic (implantable) cardiac defibrillator: Secondary | ICD-10-CM

## 2017-10-15 DIAGNOSIS — E876 Hypokalemia: Secondary | ICD-10-CM

## 2017-10-15 LAB — BASIC METABOLIC PANEL
BUN / CREAT RATIO: 14 (ref 9–20)
BUN: 20 mg/dL (ref 6–24)
CHLORIDE: 105 mmol/L (ref 96–106)
CO2: 22 mmol/L (ref 20–29)
Calcium: 9.3 mg/dL (ref 8.7–10.2)
Creatinine, Ser: 1.4 mg/dL — ABNORMAL HIGH (ref 0.76–1.27)
GFR calc Af Amer: 70 mL/min/{1.73_m2} (ref >59)
GFR calc non Af Amer: 60 mL/min/{1.73_m2} (ref >59)
GLUCOSE: 105 mg/dL — AB (ref 65–99)
POTASSIUM: 4.4 mmol/L (ref 3.5–5.2)
SODIUM: 141 mmol/L (ref 134–144)

## 2017-10-15 NOTE — Telephone Encounter (Signed)
LMOVM reminding pt to send remote transmission.   

## 2017-10-15 NOTE — Progress Notes (Signed)
Remote ICD transmission.   

## 2017-10-19 DIAGNOSIS — R011 Cardiac murmur, unspecified: Secondary | ICD-10-CM | POA: Insufficient documentation

## 2017-10-19 DIAGNOSIS — I4891 Unspecified atrial fibrillation: Secondary | ICD-10-CM | POA: Insufficient documentation

## 2017-10-20 ENCOUNTER — Encounter: Payer: Self-pay | Admitting: Cardiology

## 2017-10-20 NOTE — Telephone Encounter (Signed)
Left a message for Charles Wyatt that he has to be rescheduled on his cell, I will try his home number as well. No answer on home phone, left a message that sleep study has been rescheduled.

## 2017-10-25 ENCOUNTER — Encounter (HOSPITAL_BASED_OUTPATIENT_CLINIC_OR_DEPARTMENT_OTHER): Payer: Managed Care, Other (non HMO)

## 2017-10-28 ENCOUNTER — Ambulatory Visit: Payer: Managed Care, Other (non HMO) | Admitting: Family Medicine

## 2017-10-28 ENCOUNTER — Encounter: Payer: Self-pay | Admitting: Family Medicine

## 2017-10-28 VITALS — BP 112/72 | HR 74 | Temp 98.9°F | Ht 67.0 in | Wt 226.2 lb

## 2017-10-28 DIAGNOSIS — Z09 Encounter for follow-up examination after completed treatment for conditions other than malignant neoplasm: Secondary | ICD-10-CM | POA: Diagnosis not present

## 2017-10-28 DIAGNOSIS — G4734 Idiopathic sleep related nonobstructive alveolar hypoventilation: Secondary | ICD-10-CM | POA: Diagnosis not present

## 2017-10-28 NOTE — Progress Notes (Signed)
Pre visit review using our clinic review tool, if applicable. No additional management support is needed unless otherwise documented below in the visit note. 

## 2017-10-28 NOTE — Progress Notes (Signed)
Chief Complaint  Patient presents with  . Establish Care       New Patient Visit SUBJECTIVE: HPI: Charles Wyatt is an 46 y.o.male who is being seen for establishing care.  The patient has not routinely had a primary care physician.  He has a history of atrial fibrillation, hypertrophic cardiomyopathy, and recurrent bouts of intermittent shortness of breath.  He follows with both cardiology and electrophysiology team.  He was recently hospitalized and found to have oxygen desaturations when he sleeps.  He did have a sleep study scheduled, however his insurance fell through and this was canceled.  It has since been rescheduled.  He has lost around 12 pounds and is drinking much more water.  He checks his blood pressure at home and find it is usually in the 110's/80's.  He is compliant with his medicine.  On 10/01/17, he went to the emergency department for shortness of breath and was found to have community acquired pneumonia.  He was treated with a 7-day course of doxycycline.  He has since completed the course and denies any symptoms.  No Known Allergies  Past Medical History:  Diagnosis Date  . Atrial fibrillation (HCC)    Inappro shock  . HCM    06 19/2011 ?[facility] ?Tee to R/o subaortic membrane   . Implantable cardiac defibrillator Medtronic   . 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 CARDIOVERSION N/A 08/20/2017   Procedure: CARDIOVERSION;  Surgeon: Quintella Reicherturner, Traci R, MD;  [facility];  Service: Cardiovascular;  Laterality: N/A;  . CARDIOVERSION N/A 08/24/2017   Procedure: CARDIOVERSION;  Surgeon: Jake BatheSkains, Mark C, MD;  [facility];  Service: Cardiovascular;  Laterality: N/A;  . Defibrillator implantation     Medtronic Protecta 314 DRG  . TEE WITHOUT CARDIOVERSION N/A 08/20/2017   Procedure: TRANSESOPHAGEAL ECHOCARDIOGRAM (TEE);  Surgeon: Quintella Reicherturner, Traci R, MD;  [facility];  Service: Cardiovascular;   Laterality: N/A;   Social History   Socioeconomic History  . Marital status: Single   Family History  Problem Relation Age of Onset  . Cardiomyopathy Other        hypertensive obstructive cardiomyopathy     Current Outpatient Medications:  .  acetaminophen (TYLENOL) 500 MG tablet, Take 1,000 mg by mouth every 6 (six) hours as needed for mild pain or headache., Disp: , Rfl:  .  amiodarone (PACERONE) 200 MG tablet, Take 1 tablet (200 mg total) by mouth 2 (two) times daily., Disp: 180 tablet, Rfl: 3 .  apixaban (ELIQUIS) 5 MG TABS tablet, Take 1 tablet (5 mg total) by mouth 2 (two) times daily., Disp: 60 tablet, Rfl: 6 .  metoprolol tartrate (LOPRESSOR) 100 MG tablet, Take 1 tablet (100 mg total) by mouth 2 (two) times daily., Disp: 60 tablet, Rfl: 10 .  potassium chloride SA (K-DUR,KLOR-CON) 20 MEQ tablet, Take 2 tablets (40 mEq total) by mouth daily., Disp: 60 tablet, Rfl: 3  ROS Cardiovascular: Denies chest pain  Respiratory: Denies dyspnea   OBJECTIVE: BP 112/72 (BP Location: Left Arm, Patient Position: Sitting, Cuff Size: Large)   Pulse 74   Temp 98.9 F (37.2 C) (Oral)   Ht 5\' 7"  (1.702 m)   Wt 226 lb 4 oz (102.6 kg)   SpO2 97%   BMI 35.44 kg/m   Constitutional: -  VS reviewed -  Well developed, well nourished, appears stated age -  No apparent distress  Psychiatric: -  Oriented to person, place, and  time -  Memory intact -  Affect and mood normal -  Fluent conversation, good eye contact -  Judgment and insight age appropriate  Eye: -  Conjunctivae clear, no discharge -  Pupils symmetric, round, reactive to light  ENMT: -  MMM    Pharynx moist, no exudate, no erythema  Neck: -  No gross swelling, no palpable masses -  Thyroid midline, not enlarged, mobile, no palpable masses  Cardiovascular: -  RRR -  No LE edema  Respiratory: -  Normal respiratory effort, no accessory muscle use, no retraction -  Breath sounds equal, no wheezes, no ronchi, no crackles   Gastrointestinal: -  Bowel sounds normal -  No tenderness, no distention, no guarding, no masses  Musculoskeletal: -  No clubbing, no cyanosis -  Gait normal  Skin: -  No significant lesion on inspection -  Warm and dry to palpation   ASSESSMENT/PLAN: Follow-up for resolved condition  Nocturnal oxygen desaturation  No clinical indication to change antibiotic regimen or obtain a chest x-ray today.  Explained that we do consider the following up with a chest x-ray at 45 days, but if he is doing well clinically, we will likely not do this. Encouraged him to follow-up with a sleep study.  This could be contributing to uncontrolled atrial fibrillation/palpitations as well. He is going to see his cardiologist on Tuesday and discuss medication side effects.  I would not be changing of his medicines in light of this. Patient should return for CPE at earliest convenience. The patient voiced understanding and agreement to the plan.   Jilda Rocheicholas Paul ClaytonWendling, DO 10/28/17  5:12 PM

## 2017-10-28 NOTE — Patient Instructions (Signed)
Let us know if anything changes or if you need anything.

## 2017-11-01 ENCOUNTER — Ambulatory Visit: Payer: Managed Care, Other (non HMO) | Admitting: Internal Medicine

## 2017-11-01 ENCOUNTER — Encounter: Payer: Self-pay | Admitting: *Deleted

## 2017-11-01 ENCOUNTER — Encounter: Payer: Self-pay | Admitting: Internal Medicine

## 2017-11-01 VITALS — BP 120/86 | HR 66 | Ht 67.0 in | Wt 222.0 lb

## 2017-11-01 DIAGNOSIS — Z95810 Presence of automatic (implantable) cardiac defibrillator: Secondary | ICD-10-CM | POA: Diagnosis not present

## 2017-11-01 DIAGNOSIS — Z01812 Encounter for preprocedural laboratory examination: Secondary | ICD-10-CM | POA: Diagnosis not present

## 2017-11-01 DIAGNOSIS — Z79899 Other long term (current) drug therapy: Secondary | ICD-10-CM

## 2017-11-01 DIAGNOSIS — I480 Paroxysmal atrial fibrillation: Secondary | ICD-10-CM

## 2017-11-01 DIAGNOSIS — I422 Other hypertrophic cardiomyopathy: Secondary | ICD-10-CM | POA: Diagnosis not present

## 2017-11-01 LAB — CUP PACEART REMOTE DEVICE CHECK
Brady Statistic AP VP Percent: 3.06 %
Brady Statistic AS VP Percent: 0 %
Brady Statistic RA Percent Paced: 6.02 %
HIGH POWER IMPEDANCE MEASURED VALUE: 304 Ohm
HIGH POWER IMPEDANCE MEASURED VALUE: 65 Ohm
HighPow Impedance: 78 Ohm
Implantable Lead Implant Date: 20110921
Implantable Lead Location: 753859
Implantable Lead Location: 753860
Implantable Lead Model: 5076
Implantable Lead Model: 6947
Lead Channel Impedance Value: 361 Ohm
Lead Channel Impedance Value: 456 Ohm
Lead Channel Pacing Threshold Amplitude: 0.625 V
Lead Channel Pacing Threshold Pulse Width: 0.4 ms
Lead Channel Sensing Intrinsic Amplitude: 3.875 mV
Lead Channel Sensing Intrinsic Amplitude: 8.25 mV
Lead Channel Setting Pacing Amplitude: 2 V
Lead Channel Setting Pacing Amplitude: 2.5 V
Lead Channel Setting Pacing Pulse Width: 0.4 ms
Lead Channel Setting Sensing Sensitivity: 0.3 mV
MDC IDC LEAD IMPLANT DT: 20110921
MDC IDC MSMT BATTERY VOLTAGE: 2.61 V
MDC IDC MSMT LEADCHNL RA PACING THRESHOLD AMPLITUDE: 0.625 V
MDC IDC MSMT LEADCHNL RA PACING THRESHOLD PULSEWIDTH: 0.4 ms
MDC IDC MSMT LEADCHNL RA SENSING INTR AMPL: 4 mV
MDC IDC MSMT LEADCHNL RV SENSING INTR AMPL: 8.875 mV
MDC IDC PG IMPLANT DT: 20110921
MDC IDC SESS DTM: 20181117071510
MDC IDC STAT BRADY AP VS PERCENT: 2.96 %
MDC IDC STAT BRADY AS VS PERCENT: 93.98 %
MDC IDC STAT BRADY RV PERCENT PACED: 3.09 %

## 2017-11-01 MED ORDER — METOPROLOL TARTRATE 50 MG PO TABS: 50.0000 mg | ORAL_TABLET | Freq: Two times a day (BID) | ORAL | 3 refills | Status: DC

## 2017-11-01 NOTE — Progress Notes (Signed)
Patient Care Team: Patient, No Pcp Per as PCP - General (General Practice)   HPI  Charles Wyatt is a 46 y.o. male  seen in followup for an ICD  The device originally implanted for HCM in the setting of a family history of sudden death and significant septal thickness and hyperenhancing on cardiac MRI. He has hx of inappropriate shock from AFib RVR. On apixoban     He was seen in the emergency room 11/05/16 following 3 ICD shocks. They were recorded by the fellow as appropriate. He declined admission  Review of these data however demonstrates that he had nonsustained ventricular tachycardia and shocks were delivered because of commitment following charging on shock 1 and with recurrent nonsustained ventricular tachycardia, subsequent shocks. This occurred in the context of having taken a new weight loss stimulants. He was also not taking his beta blocker.  His family history was reviewed. It seems that the gene runs through his mother family and a large kindred has been identified and [place] being followed in [place]   He was admitted 9/18 w recurrent AFib with RVR   Since having gone home he just feels not quite right.  He has a apple watch and in the [employer] monitor.  He keeps a very close track of his heart rate.  No nausea or cough with the amiodarone.  Date TSH LFT  9/18 4.04          DATE TEST    9/17 Echo    EF 50-55 %   10/18 Echo    EF 50 % LVH/ LAE (5.8/2.6/67)  MR mile          Records and Results Reviewed PN office note//ER records    Past Medical History:  Diagnosis Date  . Atrial fibrillation (HCC)    Inappro shock  . HCM    06 19/2011 ?[facility] ?Tee to R/o subaortic membrane   . Implantable cardiac defibrillator Medtronic   . 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 CARDIOVERSION N/A 08/20/2017   Procedure: CARDIOVERSION;  Surgeon: Quintella Reicherturner, Traci R, MD;  [facility];   Service: Cardiovascular;  Laterality: N/A;  . CARDIOVERSION N/A 08/24/2017   Procedure: CARDIOVERSION;  Surgeon: Jake BatheSkains, Mark C, MD;  [facility];  Service: Cardiovascular;  Laterality: N/A;  . Defibrillator implantation     Medtronic Protecta 314 DRG  . TEE WITHOUT CARDIOVERSION N/A 08/20/2017   Procedure: TRANSESOPHAGEAL ECHOCARDIOGRAM (TEE);  Surgeon: Quintella Reicherturner, Traci R, MD;  [facility];  Service: Cardiovascular;  Laterality: N/A;    Current Outpatient Medications  Medication Sig Dispense Refill  . acetaminophen (TYLENOL) 500 MG tablet Take 1,000 mg by mouth every 6 (six) hours as needed for mild pain or headache.    Marland Kitchen. amiodarone (PACERONE) 200 MG tablet Take 1 tablet (200 mg total) by mouth 2 (two) times daily. 180 tablet 3  . apixaban (ELIQUIS) 5 MG TABS tablet Take 1 tablet (5 mg total) by mouth 2 (two) times daily. 60 tablet 6  . metoprolol tartrate (LOPRESSOR) 100 MG tablet Take 1 tablet (100 mg total) by mouth 2 (two) times daily. 60 tablet 10  . potassium chloride SA (K-DUR,KLOR-CON) 20 MEQ tablet Take 2 tablets (40 mEq total) by mouth daily. 60 tablet 3   No current facility-administered medications for this visit.     No Known Allergies    Review of Systems negative except from  HPI and PMH  Physical Exam BP 120/86   Pulse 66   Ht 5' 7" (1.702 m)   Wt 222 lb (100.7 kg)   BMI 34.77 kg/m  Well developed and nourished in no acute distress HENT normal Neck supple with JVP-flat Clear Regular rate and rhythm, no murmurs or gallops Abd-soft with active BS No Clubbing cyanosis edema Skin-warm and dry A & Oriented  Grossly normal sensory and motor function   ECG demonstrates sinus rhythm at 65 with P synchronous pacing  Assessment and  Plan HCM  ICD Medtronic   Ventricular tachycardia-nonsustained  Obesity  Atrial fibrillation with a rapid ventricular response  OSA  The patient's device is functioning normally.  He will continue with  his beta blockers.  We will decrease the dose as this may be contributing to some of his "funny feeling ".  With the amiodarone rate control of his atrial fibrillation should be sufficient.  His left atrial size is really quite large; the likelihood of recurrent atrial fibrillation is high.  Furthermore, I think the likelihood of endovascular ablation being successful given its size is diminished.  We will look into hybrid ablation   I spoke with Dr JA and he may be willing to consider endovascular ablation   We will check his amiodarone surveillance laboratories.  He appears to be tolerating it reasonably.  We have reviewed the benefits and risks of generator replacement.  These include but are not limited to lead fracture and infection.  The patient understands, agrees and is willing to proceed.          

## 2017-11-01 NOTE — H&P (View-Only) (Signed)
Patient Care Team: Patient, No Pcp Per as PCP - General (General Practice)   HPI  Charles Wyatt is a 46 y.o. male  seen in followup for an ICD  The device originally implanted for HCM in the setting of a family history of sudden death and significant septal thickness and hyperenhancing on cardiac MRI. He has hx of inappropriate shock from AFib RVR. On apixoban     He was seen in the emergency room 11/05/16 following 3 ICD shocks. They were recorded by the fellow as appropriate. He declined admission  Review of these data however demonstrates that he had nonsustained ventricular tachycardia and shocks were delivered because of commitment following charging on shock 1 and with recurrent nonsustained ventricular tachycardia, subsequent shocks. This occurred in the context of having taken a new weight loss stimulants. He was also not taking his beta blocker.  His family history was reviewed. It seems that the gene runs through his mother family and a large kindred has been identified and is being followed in [place] and [place]   He was admitted 9/18 w recurrent AFib with RVR   Since having gone home he just feels not quite right.  He has a apple watch and in the [employer] monitor.  He keeps a very close track of his heart rate.  No nausea or cough with the amiodarone.  Date TSH LFT  9/18 4.04          DATE TEST    9/17 Echo    EF 50-55 %   10/18 Echo    EF 50 % LVH/ LAE (5.8/2.6/67)  MR mile          Records and Results Reviewed PN office note//ER records    Past Medical History:  Diagnosis Date  . Atrial fibrillation (HCC)    Inappro shock  . HCM    06 19/2011 ?[facility] ?Tee to R/o subaortic membrane   . Implantable cardiac defibrillator Medtronic   . 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 CARDIOVERSION N/A 08/20/2017   Procedure: CARDIOVERSION;  Surgeon: Quintella Reicherturner, Traci R, MD;  [facility];   Service: Cardiovascular;  Laterality: N/A;  . CARDIOVERSION N/A 08/24/2017   Procedure: CARDIOVERSION;  Surgeon: Jake BatheSkains, Mark C, MD;  [facility];  Service: Cardiovascular;  Laterality: N/A;  . Defibrillator implantation     Medtronic Protecta 314 DRG  . TEE WITHOUT CARDIOVERSION N/A 08/20/2017   Procedure: TRANSESOPHAGEAL ECHOCARDIOGRAM (TEE);  Surgeon: Quintella Reicherturner, Traci R, MD;  [facility];  Service: Cardiovascular;  Laterality: N/A;    Current Outpatient Medications  Medication Sig Dispense Refill  . acetaminophen (TYLENOL) 500 MG tablet Take 1,000 mg by mouth every 6 (six) hours as needed for mild pain or headache.    Marland Kitchen. amiodarone (PACERONE) 200 MG tablet Take 1 tablet (200 mg total) by mouth 2 (two) times daily. 180 tablet 3  . apixaban (ELIQUIS) 5 MG TABS tablet Take 1 tablet (5 mg total) by mouth 2 (two) times daily. 60 tablet 6  . metoprolol tartrate (LOPRESSOR) 100 MG tablet Take 1 tablet (100 mg total) by mouth 2 (two) times daily. 60 tablet 10  . potassium chloride SA (K-DUR,KLOR-CON) 20 MEQ tablet Take 2 tablets (40 mEq total) by mouth daily. 60 tablet 3   No current facility-administered medications for this visit.     No Known Allergies    Review of Systems negative except from  HPI and PMH  Physical Exam BP 120/86   Pulse 66   Ht 5\' 7"  (1.702 m)   Wt 222 lb (100.7 kg)   BMI 34.77 kg/m  Well developed and nourished in no acute distress HENT normal Neck supple with JVP-flat Clear Regular rate and rhythm, no murmurs or gallops Abd-soft with active BS No Clubbing cyanosis edema Skin-warm and dry A & Oriented  Grossly normal sensory and motor function   ECG demonstrates sinus rhythm at 65 with P synchronous pacing  Assessment and  Plan HCM  ICD Medtronic   Ventricular tachycardia-nonsustained  Obesity  Atrial fibrillation with a rapid ventricular response  OSA  The patient's device is functioning normally.  He will continue with  his beta blockers.  We will decrease the dose as this may be contributing to some of his "funny feeling ".  With the amiodarone rate control of his atrial fibrillation should be sufficient.  His left atrial size is really quite large; the likelihood of recurrent atrial fibrillation is high.  Furthermore, I think the likelihood of endovascular ablation being successful given its size is diminished.  We will look into hybrid ablation   I spoke with Dr Fawn KirkJA and he may be willing to consider endovascular ablation   We will check his amiodarone surveillance laboratories.  He appears to be tolerating it reasonably.  We have reviewed the benefits and risks of generator replacement.  These include but are not limited to lead fracture and infection.  The patient understands, agrees and is willing to proceed.

## 2017-11-01 NOTE — Patient Instructions (Addendum)
Medication Instructions:  Your physician has recommended you make the following change in your medication:  1. DECREASE METOPROLOL TARTRATE to 50 mg twice daily.  * If you need a refill on your cardiac medications before your next appointment, please call your pharmacy. *  Labwork: Today: TSH & LFT Your physician recommends that you return for pre procedure lab work between: 11/15/17 - 11/26/17  Testing/Procedures: Your physician has recommended that you have a defibrillator generator change. Please see the instruction sheet given to you today for more information.  Follow-Up: Your physician recommends that you schedule a follow-up appointment in: 10-14 days, after your procedure on 11/29/2017, with device clinic for a wound check.  Your physician recommends that you schedule a follow-up appointment in: 91 days, after your procedure on 11/29/2017, with Dr. Graciela HusbandsKlein.  Thank you for choosing [facility]!!

## 2017-11-02 NOTE — Telephone Encounter (Signed)
Patient has been rescheduled to Tuesday January 8. Sleep assistant will call the patient with the new date.

## 2017-11-04 ENCOUNTER — Telehealth: Payer: Self-pay | Admitting: *Deleted

## 2017-11-04 NOTE — Telephone Encounter (Signed)
November 02, 2017   2:42 PM    Patient has been rescheduled to Tuesday January 8. Sleep assistant will call the patient with the new date.

## 2017-11-04 NOTE — Telephone Encounter (Signed)
Patient would like to move his test back another week due to  having surgery the week before his sleep study.

## 2017-11-26 ENCOUNTER — Other Ambulatory Visit: Payer: Managed Care, Other (non HMO) | Admitting: *Deleted

## 2017-11-26 ENCOUNTER — Telehealth: Payer: Self-pay | Admitting: Internal Medicine

## 2017-11-26 DIAGNOSIS — Z01812 Encounter for preprocedural laboratory examination: Secondary | ICD-10-CM

## 2017-11-26 DIAGNOSIS — I480 Paroxysmal atrial fibrillation: Secondary | ICD-10-CM

## 2017-11-26 DIAGNOSIS — Z79899 Other long term (current) drug therapy: Secondary | ICD-10-CM

## 2017-11-26 DIAGNOSIS — I422 Other hypertrophic cardiomyopathy: Secondary | ICD-10-CM

## 2017-11-26 NOTE — Telephone Encounter (Signed)
Pt inquiring about pre procedure lab work before his procedure on Monday. Pt will stop by the office before 5 today to have completed.

## 2017-11-26 NOTE — Telephone Encounter (Signed)
New Message   Patient is scheduled for a procedure on Monday. He is wanting a call to discuss the procedure and just to make sure that there is nothing else he needs to do. Please call.

## 2017-11-27 LAB — COMPREHENSIVE METABOLIC PANEL
A/G RATIO: 1.9 (ref 1.2–2.2)
ALT: 22 IU/L (ref 0–44)
AST: 16 IU/L (ref 0–40)
Albumin: 4.7 g/dL (ref 3.5–5.5)
Alkaline Phosphatase: 100 IU/L (ref 39–117)
BUN/Creatinine Ratio: 12 (ref 9–20)
BUN: 17 mg/dL (ref 6–24)
Bilirubin Total: 0.3 mg/dL (ref 0.0–1.2)
CALCIUM: 9.3 mg/dL (ref 8.7–10.2)
CO2: 22 mmol/L (ref 20–29)
Chloride: 103 mmol/L (ref 96–106)
Creatinine, Ser: 1.4 mg/dL — ABNORMAL HIGH (ref 0.76–1.27)
GFR, EST AFRICAN AMERICAN: 70 mL/min/{1.73_m2} (ref >59)
GFR, EST NON AFRICAN AMERICAN: 60 mL/min/{1.73_m2} (ref >59)
GLOBULIN, TOTAL: 2.5 g/dL (ref 1.5–4.5)
Glucose: 116 mg/dL — ABNORMAL HIGH (ref 65–99)
POTASSIUM: 4.3 mmol/L (ref 3.5–5.2)
SODIUM: 144 mmol/L (ref 134–144)
TOTAL PROTEIN: 7.2 g/dL (ref 6.0–8.5)

## 2017-11-27 LAB — CBC WITH DIFFERENTIAL/PLATELET
BASOS: 0 %
Basophils Absolute: 0 10*3/uL (ref 0.0–0.2)
EOS (ABSOLUTE): 0.1 10*3/uL (ref 0.0–0.4)
Eos: 1 %
HEMATOCRIT: 45.7 % (ref 37.5–51.0)
Hemoglobin: 15.2 g/dL (ref 13.0–17.7)
IMMATURE GRANS (ABS): 0 10*3/uL (ref 0.0–0.1)
Immature Granulocytes: 1 %
LYMPHS: 26 %
Lymphocytes Absolute: 2.3 10*3/uL (ref 0.7–3.1)
MCH: 32.3 pg (ref 26.6–33.0)
MCHC: 33.3 g/dL (ref 31.5–35.7)
MCV: 97 fL (ref 79–97)
MONOS ABS: 0.6 10*3/uL (ref 0.1–0.9)
Monocytes: 7 %
NEUTROS ABS: 5.6 10*3/uL (ref 1.4–7.0)
Neutrophils: 65 %
PLATELETS: 346 10*3/uL (ref 150–379)
RBC: 4.7 x10E6/uL (ref 4.14–5.80)
RDW: 14.5 % (ref 12.3–15.4)
WBC: 8.6 10*3/uL (ref 3.4–10.8)

## 2017-11-27 LAB — TSH: TSH: 3.95 u[IU]/mL (ref 0.450–4.500)

## 2017-11-29 ENCOUNTER — Ambulatory Visit (HOSPITAL_COMMUNITY)
Admission: RE | Admit: 2017-11-29 | Discharge: 2017-11-29 | Disposition: A | Discharge status: Home / Self Care | Payer: Managed Care, Other (non HMO) | Source: Ambulatory Visit | Attending: Internal Medicine | Admitting: Internal Medicine

## 2017-11-29 ENCOUNTER — Encounter (HOSPITAL_COMMUNITY)
Admission: RE | Disposition: A | Discharge status: Home / Self Care | Payer: Self-pay | Source: Ambulatory Visit | Attending: Internal Medicine

## 2017-11-29 DIAGNOSIS — G4733 Obstructive sleep apnea (adult) (pediatric): Secondary | ICD-10-CM | POA: Diagnosis not present

## 2017-11-29 DIAGNOSIS — Z01812 Encounter for preprocedural laboratory examination: Secondary | ICD-10-CM

## 2017-11-29 DIAGNOSIS — Z006 Encounter for examination for normal comparison and control in clinical research program: Secondary | ICD-10-CM | POA: Insufficient documentation

## 2017-11-29 DIAGNOSIS — I472 Ventricular tachycardia: Secondary | ICD-10-CM | POA: Diagnosis not present

## 2017-11-29 DIAGNOSIS — E669 Obesity, unspecified: Secondary | ICD-10-CM | POA: Insufficient documentation

## 2017-11-29 DIAGNOSIS — Z95810 Presence of automatic (implantable) cardiac defibrillator: Secondary | ICD-10-CM | POA: Insufficient documentation

## 2017-11-29 DIAGNOSIS — I422 Other hypertrophic cardiomyopathy: Secondary | ICD-10-CM

## 2017-11-29 DIAGNOSIS — Z79899 Other long term (current) drug therapy: Secondary | ICD-10-CM | POA: Diagnosis not present

## 2017-11-29 DIAGNOSIS — Z8241 Family history of sudden cardiac death: Secondary | ICD-10-CM | POA: Diagnosis not present

## 2017-11-29 DIAGNOSIS — Z7901 Long term (current) use of anticoagulants: Secondary | ICD-10-CM | POA: Insufficient documentation

## 2017-11-29 DIAGNOSIS — I480 Paroxysmal atrial fibrillation: Secondary | ICD-10-CM | POA: Diagnosis present

## 2017-11-29 DIAGNOSIS — Z6835 Body mass index (BMI) 35.0-35.9, adult: Secondary | ICD-10-CM | POA: Diagnosis not present

## 2017-11-29 DIAGNOSIS — I4891 Unspecified atrial fibrillation: Secondary | ICD-10-CM | POA: Diagnosis not present

## 2017-11-29 DIAGNOSIS — Z4502 Encounter for adjustment and management of automatic implantable cardiac defibrillator: Secondary | ICD-10-CM | POA: Diagnosis not present

## 2017-11-29 HISTORY — PX: PPM GENERATOR CHANGEOUT: EP1233

## 2017-11-29 LAB — SURGICAL PCR SCREEN
MRSA, PCR: NEGATIVE
STAPHYLOCOCCUS AUREUS: NEGATIVE

## 2017-11-29 SURGERY — PPM GENERATOR CHANGEOUT

## 2017-11-29 MED ORDER — MIDAZOLAM HCL 5 MG/5ML IJ SOLN
INTRAMUSCULAR | Status: AC
  Filled 2017-11-29: qty 5

## 2017-11-29 MED ORDER — FENTANYL CITRATE (PF) 100 MCG/2ML IJ SOLN
INTRAMUSCULAR | Status: AC
  Filled 2017-11-29: qty 2

## 2017-11-29 MED ORDER — HEPARIN (PORCINE) IN NACL 2-0.9 UNIT/ML-% IJ SOLN: INTRAMUSCULAR | Status: DC | PRN

## 2017-11-29 MED ORDER — SODIUM CHLORIDE 0.9 % IV SOLN
INTRAVENOUS | Status: AC
Start: 2017-11-29 — End: 2017-11-29

## 2017-11-29 MED ORDER — SODIUM CHLORIDE 0.9 % IR SOLN
Status: AC
  Filled 2017-11-29: qty 2

## 2017-11-29 MED ORDER — MIDAZOLAM HCL 5 MG/5ML IJ SOLN
INTRAMUSCULAR | Status: DC | PRN
  Administered 2017-11-29: 2 mg via INTRAVENOUS
  Administered 2017-11-29: 1 mg via INTRAVENOUS

## 2017-11-29 MED ORDER — MUPIROCIN 2 % EX OINT
1.0000 "application " | TOPICAL_OINTMENT | Freq: Once | CUTANEOUS | Status: AC
  Administered 2017-11-29: 1 via TOPICAL
  Filled 2017-11-29: qty 22

## 2017-11-29 MED ORDER — ACETAMINOPHEN 325 MG PO TABS: 325.0000 mg | ORAL_TABLET | ORAL | Status: DC | PRN

## 2017-11-29 MED ORDER — FENTANYL CITRATE (PF) 100 MCG/2ML IJ SOLN
INTRAMUSCULAR | Status: DC | PRN
  Administered 2017-11-29: 50 ug via INTRAVENOUS
  Administered 2017-11-29: 25 ug via INTRAVENOUS

## 2017-11-29 MED ORDER — CEFAZOLIN SODIUM-DEXTROSE 2-4 GM/100ML-% IV SOLN
2.0000 g | INTRAVENOUS | Status: DC
  Filled 2017-11-29: qty 100

## 2017-11-29 MED ORDER — SODIUM CHLORIDE 0.9 % IV SOLN
INTRAVENOUS | Status: DC
  Administered 2017-11-29: 07:00:00 via INTRAVENOUS

## 2017-11-29 MED ORDER — LIDOCAINE HCL (PF) 1 % IJ SOLN
INTRAMUSCULAR | Status: DC | PRN
  Administered 2017-11-29: 45 mL

## 2017-11-29 MED ORDER — CHLORHEXIDINE GLUCONATE 4 % EX LIQD
60.0000 mL | Freq: Once | CUTANEOUS | Status: DC
  Filled 2017-11-29: qty 60

## 2017-11-29 MED ORDER — MUPIROCIN 2 % EX OINT
TOPICAL_OINTMENT | CUTANEOUS | Status: AC
  Administered 2017-11-29: 1 via TOPICAL
  Filled 2017-11-29: qty 22

## 2017-11-29 MED ORDER — SODIUM CHLORIDE 0.9 % IR SOLN
80.0000 mg | Status: DC
  Filled 2017-11-29: qty 2

## 2017-11-29 MED ORDER — LIDOCAINE HCL (PF) 1 % IJ SOLN
INTRAMUSCULAR | Status: AC
  Filled 2017-11-29: qty 60

## 2017-11-29 MED ORDER — CEFAZOLIN SODIUM-DEXTROSE 2-4 GM/100ML-% IV SOLN
INTRAVENOUS | Status: AC
  Filled 2017-11-29: qty 100

## 2017-11-29 MED ORDER — CEFAZOLIN SODIUM-DEXTROSE 2-3 GM-%(50ML) IV SOLR
INTRAVENOUS | Status: AC | PRN
  Administered 2017-11-29: 2 g via INTRAVENOUS

## 2017-11-29 MED ORDER — ONDANSETRON HCL 4 MG/2ML IJ SOLN: 4.0000 mg | Freq: Four times a day (QID) | INTRAMUSCULAR | Status: DC | PRN

## 2017-11-29 MED ORDER — SODIUM CHLORIDE 0.9 % IR SOLN
Status: DC | PRN
  Administered 2017-11-29: 09:00:00

## 2017-11-29 SURGICAL SUPPLY — 6 items
CABLE ADAPT PACING TEMP 12FT (ADAPTER) ×2 IMPLANT
HEMOSTAT SURGICEL 2X4 FIBR (HEMOSTASIS) ×2 IMPLANT
ICD EVERA XT MRI DF1  DDMB1D1 (ICD Generator) ×1 IMPLANT
ICD EVERA XT MRI DF1 DDMB1D1 (ICD Generator) ×1 IMPLANT
PAD ELECT DEFIB RADIOL ZOLL (MISCELLANEOUS) ×2 IMPLANT
TRAY PACEMAKER INSERTION (PACKS) ×2 IMPLANT

## 2017-11-29 NOTE — Interval H&P Note (Signed)
History and Physical Interval Note:  11/29/2017 7:41 AM  Charles Wyatt  has presented today for surgery, with the diagnosis of eri  The various methods of treatment have been discussed with the patient and family. After consideration of risks, benefits and other options for treatment, the patient has consented to  Procedure(s): PPM GENERATOR CHANGEOUT (N/A) as a surgical intervention .  The patient's history has been reviewed, patient examined, no change in status, stable for surgery.  I have reviewed the patient's chart and labs.  Questions were answered to the patient's satisfaction.     Sherryl MangesSteven Holland Kotter

## 2017-11-29 NOTE — Discharge Instructions (Signed)

## 2017-12-01 ENCOUNTER — Encounter (HOSPITAL_COMMUNITY): Payer: Self-pay | Admitting: Internal Medicine

## 2017-12-03 ENCOUNTER — Encounter: Payer: Managed Care, Other (non HMO) | Admitting: Internal Medicine

## 2017-12-04 ENCOUNTER — Encounter: Payer: Self-pay | Admitting: Internal Medicine

## 2017-12-06 ENCOUNTER — Telehealth: Payer: Self-pay | Admitting: Internal Medicine

## 2017-12-06 NOTE — Telephone Encounter (Signed)
New message  The BP dot phrase is not working  Patient has been experiencing a higher than normal bp reading, it last read 128/90 Patient wants to know what he should do Please call.

## 2017-12-06 NOTE — Telephone Encounter (Signed)
I spoke with pt who reports Friday evening he was out and started feeling off. Not doing anything strenuous. Did have one mixed drink. He went home and checked pulse and BP and they were elevated. Felt nervous. No other symptoms. Saturday morning BP was 140/100. Sunday it was 148/98,140/90 and 117/83.  Today BP was 131/94 earlier this AM. Now 121/85. Heart rate in the 70's.  Not taking any medications other than listed on medication list. Taking metoprolol 50 mg twice daily and amiodarone 200 mg daily. States he has not been sleeping well lately. Also states he has been having trouble concentrating recently.  He did send device transmission yesterday.  Will make device clinic aware.  Will forward to Dr. Eden EmmsNishan regarding BP readings.  Pt also asking if OK to take melatonin to help with sleep.

## 2017-12-06 NOTE — Telephone Encounter (Signed)
Spoke with patient regarding new home monitor. He is not currently at home but will take pictures of his serial number and call in the morning to connect his home monitor in [employer]link.

## 2017-12-07 ENCOUNTER — Encounter (HOSPITAL_BASED_OUTPATIENT_CLINIC_OR_DEPARTMENT_OTHER): Payer: Managed Care, Other (non HMO)

## 2017-12-07 NOTE — Telephone Encounter (Signed)
Reviewed transmission from 12/05/17 at 1035.  Presenting rhythm is sinus rhythm at ~70bpm.  Lead trends stable.  No episodes, alerts, or abnormalities noted.

## 2017-12-07 NOTE — Telephone Encounter (Signed)
Spoke w/ pt and he provided his monitor serial number. I input the information into the system and the last remote transmission was received 12-05-17.

## 2017-12-09 ENCOUNTER — Telehealth: Payer: Self-pay

## 2017-12-09 DIAGNOSIS — I480 Paroxysmal atrial fibrillation: Secondary | ICD-10-CM

## 2017-12-09 NOTE — Telephone Encounter (Signed)
Called to arrange EKG and lab work. Patient states he feels fine and declines scheduling an EKG at this time.  Today, BP= 111/76 and HR= 71. He will have labs drawn when he comes for his pacer check on Monday. He understands he will call if symptoms reoccur prior to that time. He was grateful for call.

## 2017-12-09 NOTE — Telephone Encounter (Signed)
-----   Message from Jefferey PicaHeather C McGhee, RN sent at 12/08/2017  5:03 PM EST ----- Triage-  Can you help me with this. [place]. All of this started with a MyChart message.  Clarified with Dr. Graciela HusbandsKlein that the patient will need to have a CBC & TSH. I just don't know where to fit him in on the nurse schedule for an EKG. Can someone contact the patient to arrange for labs and an EKG.  Thanks!! Herbert SetaHeather (miss you all!!) ----- Message ----- From: Duke SalviaKlein, Steven C, MD Sent: 12/06/2017   5:23 PM To: Jefferey PicaHeather C McGhee, RN  H  Sent this from pt request file   Let me have you record BP daily for a week ( I need to do the same) Then we can make a decions Also w ur heart rate elevated, lets have you come by and see one of the nurses for an ECG and check a little blood work  SK  Can u faciltiate both 1) pt 2) me checking my BP  When I saw BC last week 140/85 thx

## 2017-12-13 ENCOUNTER — Other Ambulatory Visit: Payer: Managed Care, Other (non HMO)

## 2017-12-13 ENCOUNTER — Ambulatory Visit (INDEPENDENT_AMBULATORY_CARE_PROVIDER_SITE_OTHER): Payer: Managed Care, Other (non HMO) | Admitting: *Deleted

## 2017-12-13 DIAGNOSIS — I422 Other hypertrophic cardiomyopathy: Secondary | ICD-10-CM

## 2017-12-13 DIAGNOSIS — I480 Paroxysmal atrial fibrillation: Secondary | ICD-10-CM

## 2017-12-13 LAB — CBC WITH DIFFERENTIAL/PLATELET
Basophils Absolute: 0 10*3/uL (ref 0.0–0.2)
Basos: 0 %
EOS (ABSOLUTE): 0.1 10*3/uL (ref 0.0–0.4)
Eos: 1 %
Hematocrit: 42.8 % (ref 37.5–51.0)
Hemoglobin: 14.8 g/dL (ref 13.0–17.7)
IMMATURE GRANS (ABS): 0 10*3/uL (ref 0.0–0.1)
IMMATURE GRANULOCYTES: 1 %
LYMPHS: 29 %
Lymphocytes Absolute: 2 10*3/uL (ref 0.7–3.1)
MCH: 33.3 pg — ABNORMAL HIGH (ref 26.6–33.0)
MCHC: 34.6 g/dL (ref 31.5–35.7)
MCV: 96 fL (ref 79–97)
Monocytes Absolute: 0.4 10*3/uL (ref 0.1–0.9)
Monocytes: 6 %
NEUTROS PCT: 63 %
Neutrophils Absolute: 4.5 10*3/uL (ref 1.4–7.0)
PLATELETS: 352 10*3/uL (ref 150–379)
RBC: 4.44 x10E6/uL (ref 4.14–5.80)
RDW: 14.1 % (ref 12.3–15.4)
WBC: 7 10*3/uL (ref 3.4–10.8)

## 2017-12-13 LAB — CUP PACEART INCLINIC DEVICE CHECK
Battery Voltage: 3.15 V
Brady Statistic AP VP Percent: 2.56 %
Brady Statistic AP VS Percent: 3.91 %
Brady Statistic AS VP Percent: 0.03 %
Brady Statistic RA Percent Paced: 6.46 %
Date Time Interrogation Session: 20190114101341
HighPow Impedance: 49 Ohm
HighPow Impedance: 70 Ohm
Implantable Lead Implant Date: 20110921
Implantable Lead Location: 753859
Implantable Lead Model: 5076
Implantable Lead Model: 6947
Lead Channel Impedance Value: 475 Ohm
Lead Channel Pacing Threshold Amplitude: 0.625 V
Lead Channel Pacing Threshold Amplitude: 0.625 V
Lead Channel Pacing Threshold Pulse Width: 0.4 ms
Lead Channel Sensing Intrinsic Amplitude: 11.75 mV
Lead Channel Sensing Intrinsic Amplitude: 4.25 mV
Lead Channel Sensing Intrinsic Amplitude: 5.25 mV
Lead Channel Sensing Intrinsic Amplitude: 9.125 mV
Lead Channel Setting Pacing Amplitude: 2.5 V
Lead Channel Setting Pacing Pulse Width: 0.4 ms
MDC IDC LEAD IMPLANT DT: 20110921
MDC IDC LEAD LOCATION: 753860
MDC IDC MSMT BATTERY REMAINING LONGEVITY: 133 mo
MDC IDC MSMT LEADCHNL RA PACING THRESHOLD PULSEWIDTH: 0.4 ms
MDC IDC MSMT LEADCHNL RV IMPEDANCE VALUE: 342 Ohm
MDC IDC MSMT LEADCHNL RV IMPEDANCE VALUE: 399 Ohm
MDC IDC PG IMPLANT DT: 20181231
MDC IDC SET LEADCHNL RA PACING AMPLITUDE: 2 V
MDC IDC SET LEADCHNL RV SENSING SENSITIVITY: 0.3 mV
MDC IDC STAT BRADY AS VS PERCENT: 93.51 %
MDC IDC STAT BRADY RV PERCENT PACED: 2.59 %

## 2017-12-13 LAB — TSH: TSH: 5.29 u[IU]/mL — AB (ref 0.450–4.500)

## 2017-12-13 NOTE — Progress Notes (Signed)
Wound check appointment. Dermabond removed. Wound without redness or edema. Incision edges mostly approximated, wound healing well. (2) small pinholes noted near R corner of incision, no underlying stitches noted. Dr.Taylor assessed wound and recommended that bacitracin ointment and bandaid be applied today, then wash site with antibacterial soap BID and leave open to air. Patient was instructed to call for any signs of infection. Patient verbalized understanding of all information. Normal device function. Thresholds, sensing, and impedances consistent with implant measurements. Device programmed at appropriate safety margins. Histogram distribution appropriate for patient and level of activity. No mode switches or ventricular arrhythmias noted. Patient educated about wound care, arm mobility, and shock plan. ROV in 3 months with SK.

## 2017-12-21 LAB — CUP PACEART INCLINIC DEVICE CHECK
Battery Voltage: 2.61 V
Brady Statistic AS VS Percent: 77.68 %
Brady Statistic RA Percent Paced: 22.29 %
HIGH POWER IMPEDANCE MEASURED VALUE: 342 Ohm
HIGH POWER IMPEDANCE MEASURED VALUE: 342 Ohm
HIGH POWER IMPEDANCE MEASURED VALUE: 79 Ohm
HighPow Impedance: 75 Ohm
HighPow Impedance: 75 Ohm
HighPow Impedance: 79 Ohm
Lead Channel Impedance Value: 399 Ohm
Lead Channel Impedance Value: 399 Ohm
Lead Channel Impedance Value: 456 Ohm
Lead Channel Impedance Value: 456 Ohm
Lead Channel Pacing Threshold Amplitude: 0.625 V
Lead Channel Sensing Intrinsic Amplitude: 10 mV
Lead Channel Sensing Intrinsic Amplitude: 4.25 mV
Lead Channel Sensing Intrinsic Amplitude: 5.25 mV
Lead Channel Sensing Intrinsic Amplitude: 7.875 mV
Lead Channel Setting Pacing Pulse Width: 0.4 ms
Lead Channel Setting Sensing Sensitivity: 0.3 mV
MDC IDC MSMT LEADCHNL RA PACING THRESHOLD AMPLITUDE: 0.625 V
MDC IDC MSMT LEADCHNL RA PACING THRESHOLD AMPLITUDE: 0.75 V
MDC IDC MSMT LEADCHNL RA PACING THRESHOLD PULSEWIDTH: 0.4 ms
MDC IDC MSMT LEADCHNL RA PACING THRESHOLD PULSEWIDTH: 0.4 ms
MDC IDC MSMT LEADCHNL RA SENSING INTR AMPL: 5.25 mV
MDC IDC MSMT LEADCHNL RV PACING THRESHOLD PULSEWIDTH: 0.4 ms
MDC IDC MSMT LEADCHNL RV SENSING INTR AMPL: 10 mV
MDC IDC PG IMPLANT DT: 20110921
MDC IDC SESS DTM: 20181203143253
MDC IDC SET LEADCHNL RA PACING AMPLITUDE: 2 V
MDC IDC SET LEADCHNL RV PACING AMPLITUDE: 2.5 V
MDC IDC STAT BRADY AP VP PERCENT: 14.28 %
MDC IDC STAT BRADY AP VS PERCENT: 8.04 %
MDC IDC STAT BRADY AS VP PERCENT: 0 %
MDC IDC STAT BRADY RV PERCENT PACED: 14.2 %

## 2017-12-24 ENCOUNTER — Encounter (HOSPITAL_BASED_OUTPATIENT_CLINIC_OR_DEPARTMENT_OTHER): Payer: Managed Care, Other (non HMO)

## 2017-12-24 NOTE — Telephone Encounter (Signed)
Per DPR spoke to Charles Wyatt to notify the patient that his sleep study had to be rescheduled because per Amy the insurance had not approved his sleep study yet.  The patient is aware not to show up tonight for his sleep study.

## 2018-01-01 ENCOUNTER — Encounter (HOSPITAL_COMMUNITY): Payer: Self-pay | Admitting: *Deleted

## 2018-01-01 ENCOUNTER — Other Ambulatory Visit: Payer: Self-pay

## 2018-01-01 ENCOUNTER — Ambulatory Visit (INDEPENDENT_AMBULATORY_CARE_PROVIDER_SITE_OTHER): Payer: Managed Care, Other (non HMO)

## 2018-01-01 ENCOUNTER — Ambulatory Visit (HOSPITAL_COMMUNITY)
Admission: EM | Admit: 2018-01-01 | Discharge: 2018-01-01 | Disposition: A | Discharge status: Home / Self Care | Payer: Managed Care, Other (non HMO) | Attending: Family Medicine | Admitting: Family Medicine

## 2018-01-01 DIAGNOSIS — R05 Cough: Secondary | ICD-10-CM | POA: Diagnosis present

## 2018-01-01 DIAGNOSIS — R062 Wheezing: Secondary | ICD-10-CM | POA: Diagnosis not present

## 2018-01-01 DIAGNOSIS — E669 Obesity, unspecified: Secondary | ICD-10-CM | POA: Insufficient documentation

## 2018-01-01 DIAGNOSIS — Z950 Presence of cardiac pacemaker: Secondary | ICD-10-CM | POA: Insufficient documentation

## 2018-01-01 DIAGNOSIS — J209 Acute bronchitis, unspecified: Secondary | ICD-10-CM | POA: Diagnosis not present

## 2018-01-01 DIAGNOSIS — I422 Other hypertrophic cardiomyopathy: Secondary | ICD-10-CM | POA: Insufficient documentation

## 2018-01-01 DIAGNOSIS — Z7901 Long term (current) use of anticoagulants: Secondary | ICD-10-CM | POA: Diagnosis not present

## 2018-01-01 DIAGNOSIS — I480 Paroxysmal atrial fibrillation: Secondary | ICD-10-CM | POA: Diagnosis not present

## 2018-01-01 DIAGNOSIS — Z79899 Other long term (current) drug therapy: Secondary | ICD-10-CM | POA: Insufficient documentation

## 2018-01-01 LAB — POCT RAPID STREP A: STREPTOCOCCUS, GROUP A SCREEN (DIRECT): NEGATIVE

## 2018-01-01 MED ORDER — ALBUTEROL SULFATE HFA 108 (90 BASE) MCG/ACT IN AERS: 1.0000 | INHALATION_SPRAY | Freq: Four times a day (QID) | RESPIRATORY_TRACT | 0 refills | Status: DC | PRN

## 2018-01-01 MED ORDER — PREDNISONE 50 MG PO TABS: 50.0000 mg | ORAL_TABLET | Freq: Every day | ORAL | 0 refills | Status: AC

## 2018-01-01 MED ORDER — IPRATROPIUM-ALBUTEROL 0.5-2.5 (3) MG/3ML IN SOLN
RESPIRATORY_TRACT | Status: AC
  Filled 2018-01-01: qty 3

## 2018-01-01 MED ORDER — IPRATROPIUM-ALBUTEROL 0.5-2.5 (3) MG/3ML IN SOLN
3.0000 mL | Freq: Once | RESPIRATORY_TRACT | Status: DC
Start: 2018-01-01 — End: 2018-01-01

## 2018-01-01 MED ORDER — BENZONATATE 100 MG PO CAPS: 200.0000 mg | ORAL_CAPSULE | Freq: Three times a day (TID) | ORAL | 0 refills | Status: AC

## 2018-01-01 NOTE — Discharge Instructions (Addendum)
Your symptoms seem to be from bronchitis. I expect symptoms to gradually improve over the next week. Please use inhaler every 6 hours as needed for wheezing/shortness of breath.   Take prednisone daily for 5 days.   Use tessalon as needed for cough.   Please return if symptoms not improving in 1 week or worsening, develop increased shortness of breath, difficulty breathing.

## 2018-01-01 NOTE — ED Provider Notes (Signed)
[facility]    CSN: [id number] Arrival date & time: 01/01/18  1402     History   Chief Complaint Chief Complaint  Patient presents with  . Cough    HPI Charles Wyatt is a 47 y.o. male of A. fib with a pacemaker presenting today with concern of a cough.  He states that since Wednesday- days he has had a cough and some wheezing.  Has had some mild congestion and sore throat as well.  Been taking NyQuil/DayQuil and Mucinex.  His wife is also had similar symptoms and she is here today to.  Occasional fevers, up to 100.4.  Denies history of asthma, smoking.  HPI  Past Medical History:  Diagnosis Date  . Atrial fibrillation (HCC)    Inappro shock  . HCM    06 19/2011 ?[facility] ?Tee to R/o subaortic membrane   . Implantable cardiac defibrillator Medtronic   . 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 Atrial fibrillation (HCC)   . Shortness of breath   . PAF (paroxysmal atrial fibrillation) (HCC) 05/19/2013  . 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  . OBESITY 05/20/2010  . Hypertrophic cardiomyopathy (HCC) 05/20/2010  . ANEMIA 05/19/2010  . MURMUR 05/19/2010    Past Surgical History:  Procedure Laterality Date  . CARDIOVERSION N/A 08/20/2017   Procedure: CARDIOVERSION;  Surgeon: Quintella Reichert, MD;  Location: [facility] ENDOSCOPY;  Service: Cardiovascular;  Laterality: N/A;  . CARDIOVERSION N/A 08/24/2017   Procedure: CARDIOVERSION;  Surgeon: Jake Bathe, MD;  Location: [facility] ENDOSCOPY;  Service: Cardiovascular;  Laterality: N/A;  . Defibrillator implantation     Medtronic Protecta 314 DRG  . PPM GENERATOR CHANGEOUT N/A 11/29/2017   Procedure: PPM GENERATOR CHANGEOUT;  Surgeon: Duke Salvia, MD;  Location: [facility] INVASIVE CV LAB;  Service: Cardiovascular;  Laterality: N/A;  . TEE WITHOUT CARDIOVERSION N/A 08/20/2017   Procedure: TRANSESOPHAGEAL ECHOCARDIOGRAM (TEE);  Surgeon: Quintella Reichert, MD;   Location: [facility] ENDOSCOPY;  Service: Cardiovascular;  Laterality: N/A;       Home Medications    Prior to Admission medications   Medication Sig Start Date End Date Taking? Authorizing Provider  acetaminophen (TYLENOL) 500 MG tablet Take 1,000 mg by mouth every 6 (six) hours as needed for mild pain or headache.    [provider]  albuterol (PROVENTIL HFA;VENTOLIN HFA) 108 (90 Base) MCG/ACT inhaler Inhale 1-2 puffs into the lungs every 6 (six) hours as needed for up to 7 days for wheezing or shortness of breath. 01/01/18 01/08/18  Lucyann Romano C, PA-C  amiodarone (PACERONE) 200 MG tablet Take 1 tablet (200 mg total) by mouth 2 (two) times daily. Patient taking differently: Take 200 mg by mouth daily.  09/08/17   Wendall Stade, MD  apixaban (ELIQUIS) 5 MG TABS tablet Take 1 tablet (5 mg total) by mouth 2 (two) times daily. 09/24/17   Wendall Stade, MD  benzonatate (TESSALON) 100 MG capsule Take 2 capsules (200 mg total) by mouth every 8 (eight) hours for 5 days. 01/01/18 01/06/18  Naomy Esham C, PA-C  metoprolol tartrate (LOPRESSOR) 50 MG tablet Take 1 tablet (50 mg total) by mouth 2 (two) times daily. 11/01/17 01/30/18  Duke Salvia, MD  potassium chloride SA (K-DUR,KLOR-CON) 20 MEQ tablet Take 2 tablets (40 mEq total) by mouth daily. Patient taking differently: Take 40 mEq by mouth 2 (two) times daily.  09/24/17   Wendall Stade,  MD  predniSONE (DELTASONE) 50 MG tablet Take 1 tablet (50 mg total) by mouth daily for 5 days. 01/01/18 01/06/18  Random Dobrowski, Junius Creamer, PA-C    Family History Family History  Problem Relation Age of Onset  . Cardiomyopathy Other        hypertensive obstructive cardiomyopathy    Social History Social History   Tobacco Use  . Smoking status: Never Smoker  . Smokeless tobacco: Never Used  Substance Use Topics  . Alcohol use: Yes    Comment: occasional   . Drug use: No     Allergies   Patient has no known allergies.   Review of Systems Review  of Systems  Constitutional: Positive for fever. Negative for activity change, appetite change and fatigue.  HENT: Positive for congestion, postnasal drip, rhinorrhea and sinus pressure. Negative for ear pain and sore throat.   Eyes: Negative for pain and itching.  Respiratory: Positive for cough and wheezing. Negative for shortness of breath.   Cardiovascular: Negative for chest pain.  Gastrointestinal: Negative for abdominal pain, diarrhea, nausea and vomiting.  Musculoskeletal: Negative for myalgias.  Skin: Negative for rash.  Neurological: Negative for dizziness, light-headedness and headaches.     Physical Exam Triage Vital Signs ED Triage Vitals [01/01/18 1533]  Enc Vitals Group     BP 119/64     Pulse Rate 78     Resp      Temp 98.6 F (37 C)     Temp Source Oral     SpO2 98 %     Weight      Height      Head Circumference      Peak Flow      Pain Score      Pain Loc      Pain Edu?      Excl. in GC?    No data found.  Updated Vital Signs BP 119/64 (BP Location: Left Arm)   Pulse 78   Temp 98.6 F (37 C) (Oral)   SpO2 98%    Physical Exam  Constitutional: He appears well-developed and well-nourished.  HENT:  Head: Normocephalic and atraumatic.  Right Ear: Tympanic membrane and ear canal normal.  Left Ear: Tympanic membrane and ear canal normal.  Nose: Rhinorrhea present.  Mouth/Throat: Uvula is midline. No oral lesions. No trismus in the jaw. No uvula swelling. Posterior oropharyngeal erythema present. Tonsils are 2+ on the right. Tonsils are 2+ on the left. No tonsillar exudate.  Eyes: Conjunctivae are normal.  Neck: Neck supple.  Cardiovascular: Normal rate and regular rhythm.  No murmur heard. Pulmonary/Chest: Effort normal. No respiratory distress. He has wheezes.  Mild wheezing bilaterally.  Abdominal: Soft. There is no tenderness.  Musculoskeletal: He exhibits no edema.  Neurological: He is alert.  Skin: Skin is warm and dry.  Psychiatric: He  has a normal mood and affect.  Nursing note and vitals reviewed.    UC Treatments / Results  Labs (all labs ordered are listed, but only abnormal results are displayed) Labs Reviewed  CULTURE, GROUP A STREP [facility])  POCT RAPID STREP A    EKG  EKG Interpretation None       Radiology Dg Chest 2 View  Result Date: 01/01/2018 CLINICAL DATA:  Cough, wheezing, low-grade fever EXAM: CHEST  2 VIEW COMPARISON:  10/01/2017 FINDINGS: Lungs are clear.  No pleural effusion or pneumothorax. The heart is top-normal in size.  Right subclavian ICD. Visualized osseous structures are within normal limits. IMPRESSION: Normal chest  radiographs. Electronically Signed   By: Charline BillsSriyesh  Krishnan M.D.   On: 01/01/2018 16:14    Procedures Procedures (including critical care time)  Medications Ordered in UC Medications - No data to display   Initial Impression / Assessment and Plan / UC Course  I have reviewed the triage vital signs and the nursing notes.  Pertinent labs & imaging results that were available during my care of the patient were reviewed by me and considered in my medical decision making (see chart for details).     Symptoms likely related to acute bronchitis.  Cough is his main symptoms, minimal congestion.  Chest x-ray negative for pneumonia.  Will provide albuterol inhaler to use as needed, prednisone for 5 days and Tessalon for cough. Discussed strict return precautions. Patient verbalized understanding and is agreeable with plan.   Final Clinical Impressions(s) / UC Diagnoses   Final diagnoses:  Acute bronchitis, unspecified organism    ED Discharge Orders        Ordered    albuterol (PROVENTIL HFA;VENTOLIN HFA) 108 (90 Base) MCG/ACT inhaler  Every 6 hours PRN     01/01/18 1620    predniSONE (DELTASONE) 50 MG tablet  Daily     01/01/18 1620    benzonatate (TESSALON) 100 MG capsule  Every 8 hours     01/01/18 1620       Controlled Substance Prescriptions [place] Controlled  Substance Registry consulted? Not Applicable   Lew DawesWieters, Kristyne Woodring C, [place]PA-C 01/01/18 1628

## 2018-01-01 NOTE — ED Triage Notes (Signed)
Cough, wheezing, lunch time to night it gets worse.

## 2018-01-04 LAB — CULTURE, GROUP A STREP (THRC)

## 2018-01-18 ENCOUNTER — Other Ambulatory Visit: Payer: Self-pay | Admitting: Cardiovascular Disease

## 2018-01-18 DIAGNOSIS — E876 Hypokalemia: Secondary | ICD-10-CM

## 2018-02-15 ENCOUNTER — Encounter: Payer: Managed Care, Other (non HMO) | Admitting: Internal Medicine

## 2018-02-18 ENCOUNTER — Encounter: Payer: Self-pay | Admitting: Internal Medicine

## 2018-02-18 ENCOUNTER — Ambulatory Visit (INDEPENDENT_AMBULATORY_CARE_PROVIDER_SITE_OTHER): Payer: Managed Care, Other (non HMO) | Admitting: Internal Medicine

## 2018-02-18 VITALS — BP 124/86 | HR 73 | Ht 67.0 in | Wt 238.0 lb

## 2018-02-18 DIAGNOSIS — I422 Other hypertrophic cardiomyopathy: Secondary | ICD-10-CM | POA: Diagnosis not present

## 2018-02-18 DIAGNOSIS — Z95810 Presence of automatic (implantable) cardiac defibrillator: Secondary | ICD-10-CM

## 2018-02-18 LAB — TSH: TSH: 4.03 u[IU]/mL (ref 0.450–4.500)

## 2018-02-18 NOTE — Patient Instructions (Addendum)
Medication Instructions:  Your physician recommends that you continue on your current medications as directed. Please refer to the Current Medication list given to you today.  Labwork: You will have labs drawn today:  TSH  Testing/Procedures: None ordered.  Follow-Up: Your physician recommends that you schedule a follow-up appointment in:   6 months with Francis Dowseenee Ursuy   You will need to make an appointment with Dr Johney FrameAllred for a referral for an Afib Ablation  Your follow up appointment with Dr Eden EmmsNishan can be scheduled today.   Remote monitoring is used to monitor your ICD from home. This monitoring reduces the number of office visits required to check your device to one time per year. It allows us to keep an eye on the functioning of your device to ensure it is working properly. You are scheduled for a device check from home on 05/23/2018. You may send your transmission at any time that day. If you have a wireless device, the transmission will be sent automatically. After your physician reviews your transmission, you will receive a postcard with your next transmission date.    Any Other Special Instructions Will Be Listed Below (If Applicable).     If you need a refill on your cardiac medications before your next appointment, please call your pharmacy.

## 2018-02-18 NOTE — Progress Notes (Signed)
Patient Care Team: Sharlene Dory, DO as PCP - General (Family Medicine) Wendall Stade, MD as PCP - Cardiology (Cardiology) Duke Salvia, MD as Consulting Physician (Cardiology)   HPI  Charles Wyatt is a 47 y.o. male seen in followup for an ICD  The device originally implanted for HCM in the setting of a family history of sudden death and significant septal thickness and hyperenhancing on cardiac MRI. He has hx of inappropriate shock from AFib RVR. On apixoban  He underwent ICD generator replacement 12/18.   He was seen in the emergency room 11/05/16 following 3 ICD shocks. They were recorded by the fellow as appropriate. He declined admission  Review of these data however demonstrates that he had nonsustained ventricular tachycardia and shocks were delivered because of commitment following charging on shock 1 and with recurrent nonsustained ventricular tachycardia, subsequent shocks. This occurred in the context of having taken a new weight loss stimulants. He was also not taking his beta blocker.  His family history was reviewed. It seems that the gene runs through his mother family and a large kindred has been identified and is being followed in [place] and [place]   He was admitted 9/18 w recurrent AFib with RVR; he was started on amiodarone.     I have reached out to Dr. Fawn Kirk concerning catheter ablation not withstanding his significant left atrial enlargement.  Functional status remains stable.  He has mild shortness of breath.  He is tolerating his amiodarone without cough nausea sun sensitivity.  No interval palpitations.  Date Cr K TSH LFT  9/18   4.04    1/19  1.4 4.3 5.29    DATE TEST    9/17 Echo    EF 50-55 %   10/18 Echo    EF 50 % LVH/ LAE (5.8/2.6/67)  MR mile            Past Medical History:  Diagnosis Date  . Atrial fibrillation (HCC)    Inappro shock  . HCM    06 19/2011 ?[facility] ?Tee to R/o subaortic membrane   . Implantable cardiac  defibrillator Medtronic   . 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 CARDIOVERSION N/A 08/20/2017   Procedure: CARDIOVERSION;  Surgeon: Quintella Reichert, MD;  Location: [facility] ENDOSCOPY;  Service: Cardiovascular;  Laterality: N/A;  . CARDIOVERSION N/A 08/24/2017   Procedure: CARDIOVERSION;  Surgeon: Jake Bathe, MD;  Location: [facility] ENDOSCOPY;  Service: Cardiovascular;  Laterality: N/A;  . Defibrillator implantation     Medtronic Protecta 314 DRG  . PPM GENERATOR CHANGEOUT N/A 11/29/2017   Procedure: PPM GENERATOR CHANGEOUT;  Surgeon: Duke Salvia, MD;  Location: [facility] INVASIVE CV LAB;  Service: Cardiovascular;  Laterality: N/A;  . TEE WITHOUT CARDIOVERSION N/A 08/20/2017   Procedure: TRANSESOPHAGEAL ECHOCARDIOGRAM (TEE);  Surgeon: Quintella Reichert, MD;  Location: [facility] ENDOSCOPY;  Service: Cardiovascular;  Laterality: N/A;    Current Outpatient Medications  Medication Sig Dispense Refill  . acetaminophen (TYLENOL) 500 MG tablet Take 1,000 mg by mouth every 6 (six) hours as needed for mild pain or headache.    Marland Kitchen amiodarone (PACERONE) 200 MG tablet Take 200 mg by mouth daily.    Marland Kitchen apixaban (ELIQUIS) 5 MG TABS tablet Take 1 tablet (5 mg total) by mouth 2 (two) times daily. 60 tablet 6  . KLOR-CON M20 20 MEQ tablet TAKE 2 TABLETS (40 MEQ TOTAL) BY MOUTH DAILY.  60 tablet 7  . metoprolol tartrate (LOPRESSOR) 50 MG tablet Take 1 tablet (50 mg total) by mouth 2 (two) times daily. 180 tablet 3   No current facility-administered medications for this visit.     No Known Allergies    Review of Systems negative except from HPI and PMH  Physical Exam BP 124/86   Pulse 73   Ht 5\' 7"  (1.702 m)   Wt 238 lb (108 kg)   SpO2 98%   BMI 37.28 kg/m  Well developed and nourished in no acute distress HENT normal Neck supple with JVP-flat Clear Device pocket well healed; without hematoma or erythema.  There is no tethering  Regular rate and rhythm, no  murmurs or gallops Abd-soft with active BS No Clubbing cyanosis edema Skin-warm and dry A & Oriented  Grossly normal sensory and motor function    ECG demonstrates sinus@ 68 19/11/38  Assessment and  Plan HCM  ICD Medtronic   Ventricular tachycardia-nonsustained  Obesity  Atrial fibrillation with a rapid ventricular response  OSA  Tolerating amiodarone.  Labs are within reasonable limits apart from the recent interval change in his TSH.  We will recheck it today.  We reviewed again the possibility of catheter ablation.  I have reached out to Dr. Fawn KirkJA before; not withstanding his left atrial size, he said he would be willing to consider catheter ablation.  Given the patient's young age and the amiodarone I would appreciate his insights.  Device function is normal.  No interval ventricular arrhythmias or atrial fibrillation.  We spent more than 50% of our >25 min visit in face to face counseling regarding the above

## 2018-02-19 LAB — CUP PACEART INCLINIC DEVICE CHECK
Brady Statistic AP VP Percent: 3.25 %
Brady Statistic AP VS Percent: 2.02 %
Brady Statistic AS VP Percent: 0.03 %
Brady Statistic RA Percent Paced: 5.23 %
Brady Statistic RV Percent Paced: 2.99 %
HIGH POWER IMPEDANCE MEASURED VALUE: 68 Ohm
HighPow Impedance: 49 Ohm
Implantable Lead Implant Date: 20110921
Implantable Lead Location: 753859
Implantable Lead Location: 753860
Implantable Lead Model: 5076
Implantable Lead Model: 6947
Lead Channel Impedance Value: 342 Ohm
Lead Channel Impedance Value: 361 Ohm
Lead Channel Pacing Threshold Amplitude: 0.75 V
Lead Channel Pacing Threshold Pulse Width: 0.4 ms
Lead Channel Sensing Intrinsic Amplitude: 9.625 mV
Lead Channel Setting Pacing Amplitude: 2 V
Lead Channel Setting Pacing Amplitude: 2.5 V
Lead Channel Setting Pacing Pulse Width: 0.4 ms
Lead Channel Setting Sensing Sensitivity: 0.3 mV
MDC IDC LEAD IMPLANT DT: 20110921
MDC IDC MSMT BATTERY REMAINING LONGEVITY: 131 mo
MDC IDC MSMT BATTERY VOLTAGE: 3.13 V
MDC IDC MSMT LEADCHNL RA IMPEDANCE VALUE: 456 Ohm
MDC IDC MSMT LEADCHNL RA PACING THRESHOLD AMPLITUDE: 0.75 V
MDC IDC MSMT LEADCHNL RA PACING THRESHOLD PULSEWIDTH: 0.4 ms
MDC IDC MSMT LEADCHNL RA SENSING INTR AMPL: 4.875 mV
MDC IDC PG IMPLANT DT: 20181231
MDC IDC SESS DTM: 20190322134910
MDC IDC STAT BRADY AS VS PERCENT: 94.7 %

## 2018-02-25 NOTE — Telephone Encounter (Signed)
       Deidre AlaHiggins, Amy C  Johniece Hornbaker G, CMA        Original precert was denied. I am resubmitting now with additional notes from Dr. Graciela HusbandsKlein.     ----- Message -----  From: Reesa ChewJones, Gifford Ballon G, CMA  Sent: 02/25/2018  9:47 AM  To: [facility] Div Sleep Studies  Subject: Authorization                   Financial (Auth has not been obtained. Appointment canceled.  Has his authorization been approved?  Thanks

## 2018-02-25 NOTE — Telephone Encounter (Signed)
Per Amy: Financial (Auth has not been obtained. Appointment canceled...spoke with patient and he is aware and should expect a phone call from CVD. KB.)  I have sent a message to pre cert asking if the authorization was ever approved.

## 2018-03-02 NOTE — Telephone Encounter (Signed)
In lab denied  Deidre AlaHiggins, Amy C  Kreston Ahrendt G, CMA        In lab study was denied for this patient.  Amy

## 2018-03-16 NOTE — Progress Notes (Addendum)
Patient ID: Charles Wyatt, male   DOB: 07/07/1971, 47 y.o.   MRN: [id number]   47 y.o. history of HOCM and PAF. AICD placed due to family history of sudden death. Had other high risk features including septal thickness 21 mm and hyperenhancement on MRI. AICD followed by [name]. Had one shock in 2014 for rapid afib. Has had multiple recurrences most recent with TEE/DCC 08/20/17 reverted to afib and started on amiodarone with successful repeat [facility] on 08/20/17.    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  All other systems reviewed and negative  General: Affect appropriate Overweight white male HEENT: right eye ptosis  Neck supple with no adenopathy JVP normal no bruits no thyromegaly Lungs clear with no wheezing and good diaphragmatic motion Heart:  S1/S2 SEM only with valsalva   , no rub, gallop or click PMI normal AICD under right clavicle  Abdomen: benighn, BS positve, no tenderness, no AAA no bruit.  No HSM or HJR Distal pulses intact with no  bruits No edema Neuro non-focal Skin warm and dry No muscular weakness AICD under left clavicle   Current Outpatient Medications  Medication Sig Dispense Refill  . acetaminophen (TYLENOL) 500 MG tablet Take 1,000 mg by mouth every 6 (six) hours as needed for mild pain or headache.    Marland Kitchen amiodarone (PACERONE) 200 MG tablet Take 200 mg by mouth daily.    Marland Kitchen apixaban (ELIQUIS) 5 MG TABS tablet Take 1 tablet (5 mg total) by mouth 2 (two) times daily. 60 tablet 6  . KLOR-CON M20 20 MEQ tablet TAKE 2 TABLETS (40 MEQ TOTAL) BY MOUTH DAILY. 60 tablet 7  . metoprolol tartrate (LOPRESSOR) 50 MG tablet Take 1 tablet (50 mg total) by mouth 2 (two) times daily. 180 tablet 3   No current facility-administered medications for this visit.     Allergies  Patient has no known allergies.  Electrocardiogram:  04/11/13  SR rate 73  Biphasic T waves  Chronic  07/29/15  SR rate 84  Same biphasic T waves especially laterally  Assessment and Plan  HOCM:continue beta blocker non osbstructive AICD: Appropriate d/c for VF , AF Rx turned off f/u SK generator change 11/2017 PAF:  Not ideal to be on amiodarone given young age f/u SK consider ablation given issues with AICD shock for PAF as well  For now continue 200 mg daily has f/u with JA [id number] to have sleep study will re order Documented hypoxemia in hospital  Will order PFTls  with DLCO TSH and LFTls recently normal   Charlton HawsPeter Charese Abundis

## 2018-03-18 ENCOUNTER — Ambulatory Visit: Payer: Managed Care, Other (non HMO) | Admitting: Cardiovascular Disease

## 2018-03-18 ENCOUNTER — Encounter: Payer: Self-pay | Admitting: Cardiovascular Disease

## 2018-03-18 VITALS — BP 118/80 | HR 71 | Ht 67.0 in | Wt 239.5 lb

## 2018-03-18 DIAGNOSIS — I480 Paroxysmal atrial fibrillation: Secondary | ICD-10-CM | POA: Diagnosis not present

## 2018-03-18 DIAGNOSIS — Z79899 Other long term (current) drug therapy: Secondary | ICD-10-CM

## 2018-03-18 DIAGNOSIS — I422 Other hypertrophic cardiomyopathy: Secondary | ICD-10-CM | POA: Diagnosis not present

## 2018-03-18 DIAGNOSIS — G4739 Other sleep apnea: Secondary | ICD-10-CM

## 2018-03-18 NOTE — Patient Instructions (Addendum)
Medication Instructions:  Your physician recommends that you continue on your current medications as directed. Please refer to the Current Medication list given to you today.  Labwork: NONE  Testing/Procedures: Your physician has recommended that you have a sleep study. This test records several body functions during sleep, including: brain activity, eye movement, oxygen and carbon dioxide blood levels, heart rate and rhythm, breathing rate and rhythm, the flow of air through your mouth and nose, snoring, body muscle movements, and chest and belly movement.  Your physician has recommended that you have a pulmonary function test. Pulmonary Function Tests are a group of tests that measure how well air moves in and out of your lungs.  Follow-Up: Your physician wants you to follow-up in: 12 months with Dr. Eden EmmsNishan. You will receive a reminder letter in the mail two months in advance. If you don't receive a letter, please call our office to schedule the follow-up appointment.   If you need a refill on your cardiac medications before your next appointment, please call your pharmacy.

## 2018-03-21 ENCOUNTER — Telehealth: Payer: Self-pay | Admitting: *Deleted

## 2018-03-21 NOTE — Telephone Encounter (Signed)
Faxed PA request for sleep study to [employer].

## 2018-03-28 ENCOUNTER — Encounter: Payer: Self-pay | Admitting: Internal Medicine

## 2018-03-28 ENCOUNTER — Ambulatory Visit: Payer: Managed Care, Other (non HMO) | Admitting: Internal Medicine

## 2018-03-28 VITALS — BP 124/88 | HR 71 | Ht 67.0 in | Wt 239.0 lb

## 2018-03-28 DIAGNOSIS — I480 Paroxysmal atrial fibrillation: Secondary | ICD-10-CM

## 2018-03-28 DIAGNOSIS — I422 Other hypertrophic cardiomyopathy: Secondary | ICD-10-CM | POA: Diagnosis not present

## 2018-03-28 LAB — CUP PACEART INCLINIC DEVICE CHECK
Battery Remaining Longevity: 131 mo
Brady Statistic AP VP Percent: 2.21 %
Brady Statistic AP VS Percent: 1.4 %
Brady Statistic AS VP Percent: 0.03 %
Brady Statistic AS VS Percent: 96.36 %
Brady Statistic RA Percent Paced: 3.61 %
HighPow Impedance: 50 Ohm
HighPow Impedance: 60 Ohm
Implantable Lead Implant Date: 20110921
Implantable Lead Location: 753859
Implantable Lead Model: 5076
Lead Channel Impedance Value: 304 Ohm
Lead Channel Impedance Value: 456 Ohm
Lead Channel Pacing Threshold Amplitude: 0.75 V
Lead Channel Pacing Threshold Amplitude: 0.75 V
Lead Channel Pacing Threshold Pulse Width: 0.4 ms
Lead Channel Pacing Threshold Pulse Width: 0.4 ms
Lead Channel Sensing Intrinsic Amplitude: 3.5 mV
Lead Channel Sensing Intrinsic Amplitude: 4.875 mV
Lead Channel Sensing Intrinsic Amplitude: 9 mV
Lead Channel Setting Pacing Amplitude: 2.5 V
MDC IDC LEAD IMPLANT DT: 20110921
MDC IDC LEAD LOCATION: 753860
MDC IDC MSMT BATTERY VOLTAGE: 3.11 V
MDC IDC MSMT LEADCHNL RV IMPEDANCE VALUE: 361 Ohm
MDC IDC MSMT LEADCHNL RV SENSING INTR AMPL: 10 mV
MDC IDC PG IMPLANT DT: 20181231
MDC IDC SESS DTM: 20190429090958
MDC IDC SET LEADCHNL RA PACING AMPLITUDE: 2 V
MDC IDC SET LEADCHNL RV PACING PULSEWIDTH: 0.4 ms
MDC IDC SET LEADCHNL RV SENSING SENSITIVITY: 0.3 mV
MDC IDC STAT BRADY RV PERCENT PACED: 2.25 %

## 2018-03-28 MED ORDER — AMIODARONE HCL 200 MG PO TABS: 100.0000 mg | ORAL_TABLET | Freq: Every day | ORAL | 1 refills | Status: DC

## 2018-03-28 NOTE — Patient Instructions (Signed)
Medication Instructions:  Your physician has recommended you make the following change in your medication:  1. DECREASE Amiodarone to 100 mg daily  Labwork: None ordered  Testing/Procedures: None ordered  Follow-Up: Your physician recommends that you schedule a follow-up appointment in: 3 months with Dr. Graciela Husbands.   * If you need a refill on your cardiac medications before your next appointment, please call your pharmacy.   *Please note that any paperwork needing to be filled out by the provider will need to be addressed at the front desk prior to seeing the provider. Please note that any FMLA, disability or other documents regarding health condition is subject to a $25.00 charge that must be received prior to completion of paperwork in the form of a money order or check.  Thank you for choosing [facility]!!

## 2018-03-28 NOTE — Progress Notes (Signed)
Electrophysiology Office Note   Date:  03/28/2018   ID:  Charles Wyatt, DOB 03/03/1971, MRN [id number]  PCP:  Sharlene Dory, DO  Cardiologist:  Dr Eden Emms Primary Electrophysiologist: [name]  CC: Afib   History of Present Illness: Charles Wyatt is a 47 y.o. male who presents today for electrophysiology evaluation.   The patient reports initially being diagnosed with afib 02/2013 after an inappropriate ICD shock for afib with RVR.  Of note, he had ICD shocks for VT 11/05/16.  This occurred in the setting of new weight loss stimulants and medicine noncompliance.  He did well without any further symptoms of afib until September 2018 when he again presented with afib with RVR.  He was placed on amiodarone.  He has done well without further episodes of afib. He is active but does not exercise.   He reports mild chronic SOB. Today, he denies symptoms of palpitations, chest pain, orthopnea, PND, lower extremity edema, claudication, dizziness, presyncope, syncope, bleeding, or neurologic sequela. The patient is tolerating medications without difficulties and is otherwise without complaint today.    Past Medical History:  Diagnosis Date  . Atrial fibrillation (HCC)    Inappro shock  . HCM    06 19/2011 ?[facility] ?Tee to R/o subaortic membrane   . Implantable cardiac defibrillator Medtronic   . 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 CARDIOVERSION N/A 08/20/2017   Procedure: CARDIOVERSION;  Surgeon: Quintella Reichert, MD;  Location: [facility] ENDOSCOPY;  Service: Cardiovascular;  Laterality: N/A;  . CARDIOVERSION N/A 08/24/2017   Procedure: CARDIOVERSION;  Surgeon: Jake Bathe, MD;  Location: [facility] ENDOSCOPY;  Service: Cardiovascular;  Laterality: N/A;  . Defibrillator implantation     Medtronic Protecta 314 DRG  . PPM GENERATOR CHANGEOUT N/A 11/29/2017   Procedure: PPM GENERATOR CHANGEOUT;  Surgeon: Duke Salvia, MD;  Location: [facility]  INVASIVE CV LAB;  Service: Cardiovascular;  Laterality: N/A;  . TEE WITHOUT CARDIOVERSION N/A 08/20/2017   Procedure: TRANSESOPHAGEAL ECHOCARDIOGRAM (TEE);  Surgeon: Quintella Reichert, MD;  Location: [facility] ENDOSCOPY;  Service: Cardiovascular;  Laterality: N/A;     Current Outpatient Medications  Medication Sig Dispense Refill  . acetaminophen (TYLENOL) 500 MG tablet Take 1,000 mg by mouth every 6 (six) hours as needed for mild pain or headache.    Marland Kitchen amiodarone (PACERONE) 200 MG tablet Take 200 mg by mouth daily.    Marland Kitchen apixaban (ELIQUIS) 5 MG TABS tablet Take 1 tablet (5 mg total) by mouth 2 (two) times daily. 60 tablet 6  . KLOR-CON M20 20 MEQ tablet TAKE 2 TABLETS (40 MEQ TOTAL) BY MOUTH DAILY. 60 tablet 7  . metoprolol tartrate (LOPRESSOR) 50 MG tablet Take 1 tablet (50 mg total) by mouth 2 (two) times daily. 180 tablet 3   No current facility-administered medications for this visit.     Allergies:   Patient has no known allergies.   Social History:  The patient  reports that he has never smoked. He has never used smokeless tobacco. He reports that he drinks alcohol. He reports that he does not use drugs.   Family History:  The patient's  family history includes Cardiomyopathy in his other.    ROS:  Please see the history of present illness.   All other systems are personally reviewed and negative.    PHYSICAL EXAM: VS:  BP 124/88   Pulse 71   Ht  (1.702 m)   Wt 239  lb (108.4 kg)   BMI 37.43 kg/m  , BMI Body mass index is 37.43 kg/m. GEN: Well nourished, well developed, in no acute distress  HEENT: normal  Neck: no JVD, carotid bruits, or masses Cardiac: RRR; no murmurs, rubs, or gallops,no edema  Respiratory:  clear to auscultation bilaterally, normal work of breathing GI: soft, nontender, nondistended, + BS MS: no deformity or atrophy  Skin: warm and dry  Neuro:  Strength and sensation are intact Psych: euthymic mood, full affect  EKG:  EKG is ordered today. The ekg  ordered today is personally reviewed and shows sinus rhythm 71 bpm, PR 202 msec, QRS 118 msec, Qtc 436 msec,   ICD interrogation today is personally reviewed (see paceArt).  No afib since implantation 11/29/17   Recent Labs: 08/25/2017: B Natriuretic Peptide 588.2; Magnesium 2.2 11/26/2017: ALT 22; BUN 17; Creatinine, Ser 1.40; Potassium 4.3; Sodium 144 12/13/2017: Hemoglobin 14.8; Platelets 352 02/18/2018: TSH 4.030  personally reviewed   Lipid Panel  No results found for: CHOL, TRIG, HDL, CHOLHDL, VLDL, LDLCALC, LDLDIRECT personally reviewed   Wt Readings from Last 3 Encounters:  03/28/18 239 lb (108.4 kg)  03/18/18 239 lb 8 oz (108.6 kg)  02/18/18 238 lb (108 kg)      Other studies personally reviewed: Additional studies/ records that were reviewed today include: [name] and Ricki Miller prior notes, prior echo, hospital records  Review of the above records today demonstrates: as above   ASSESSMENT AND PLAN:  1.  Persistent afib The patient has very infrequent (only 2 prior episodes over the past 5 years) of symptomatic afib.  He is now on amiodarone  daily.  He has severe biatrial enlargement.  He has HCM.  He also has obesity, does not exercise, and snores. Therapeutic strategies for afib including medicine and ablation were discussed in detail with the patient today. Risk, benefits, and alternatives to EP study and radiofrequency ablation for afib were also discussed in detail today.  He has done extensive reading on afib ablation and is aware of its limitation, particularly given his HCM and atrial enlargement.  Given his very low AF burden, he would currently prefer to continue medical therapy. We discussed risks of amiodarone at length today.  He wishes to continue this medicine.  I have offered norpace as a potentially good alternative for him. For now, he would prefer to reduce amiodarone to  daily and follow. He will discuss Norpace as an option when he sees [name]  again.  We discussed Cardiofit data which suggests regular exercise, weight reduction as the best long term option for AF prevention. As he snores, I have agreed with Drs Graciela Husbands and Eden Emms that sleep study is important   Follow-up:  In 3 months with [name]  I will see when needed  Current medicines are reviewed at length with the patient today.   The patient does not have concerns regarding his medicines.  The following changes were made today:  none   Signed, Hillis Range, MD  03/28/2018 8:41 AM     [facility] [address] [place] [address] [phone] (office) [phone] (fax)

## 2018-03-29 ENCOUNTER — Telehealth: Payer: Self-pay | Admitting: *Deleted

## 2018-03-29 NOTE — Telephone Encounter (Signed)
Patient is scheduled for lab study on 04/22/18. Patient understands his sleep study will be done at [facility] sleep lab. Patient understands he will receive a sleep packet in a week or so. Patient understands to call if he does not receive the sleep packet in a timely manner.  Left detailed message on voicemail per dpr at [phone] with date and time of sleep study and informed patient to call back to confirm or reschedule.

## 2018-03-29 NOTE — Telephone Encounter (Signed)
  Gaynelle Cage, CMA  Reesa Chew, CMA        Insurance approval received. Ok to schedule in split night sleep study.     ----- Message -----  From: Gaynelle Cage, CMA  Sent: 03/29/2018 12:01 PM  To: [facility] Sleep [facility]     ----- Message -----  From: Ethelda Chick, RN  Sent: 03/18/2018  8:25 AM  To: [facility] Sleep Studies   Patient needs a sleep study, split night study has been ordered.

## 2018-03-29 NOTE — Telephone Encounter (Signed)
Staff message sent to [employer] received. Ok to schedule in lab split night sleep study.

## 2018-04-18 ENCOUNTER — Ambulatory Visit (INDEPENDENT_AMBULATORY_CARE_PROVIDER_SITE_OTHER): Payer: Managed Care, Other (non HMO) | Admitting: Internal Medicine

## 2018-04-18 DIAGNOSIS — I422 Other hypertrophic cardiomyopathy: Secondary | ICD-10-CM | POA: Diagnosis not present

## 2018-04-18 DIAGNOSIS — I480 Paroxysmal atrial fibrillation: Secondary | ICD-10-CM

## 2018-04-18 DIAGNOSIS — Z79899 Other long term (current) drug therapy: Secondary | ICD-10-CM

## 2018-04-18 LAB — PULMONARY FUNCTION TEST
DL/VA % pred: 118 %
DL/VA: 5.28 ml/min/mmHg/L
DLCO UNC % PRED: 89 %
DLCO unc: 25.34 ml/min/mmHg
FEF 25-75 PRE: 1.91 L/s
FEF 25-75 Post: 2.19 L/sec
FEF2575-%CHANGE-POST: 14 %
FEF2575-%Pred-Post: 64 %
FEF2575-%Pred-Pre: 56 %
FEV1-%Change-Post: 4 %
FEV1-%PRED-POST: 70 %
FEV1-%PRED-PRE: 67 %
FEV1-POST: 2.61 L
FEV1-PRE: 2.5 L
FEV1FVC-%Change-Post: 0 %
FEV1FVC-%Pred-Pre: 94 %
FEV6-%CHANGE-POST: 4 %
FEV6-%PRED-POST: 77 %
FEV6-%Pred-Pre: 74 %
FEV6-PRE: 3.35 L
FEV6-Post: 3.48 L
FEV6FVC-%Change-Post: 0 %
FEV6FVC-%PRED-PRE: 103 %
FEV6FVC-%Pred-Post: 102 %
FVC-%CHANGE-POST: 4 %
FVC-%Pred-Post: 74 %
FVC-%Pred-Pre: 71 %
FVC-Post: 3.49 L
FVC-Pre: 3.35 L
POST FEV6/FVC RATIO: 100 %
Post FEV1/FVC ratio: 75 %
Pre FEV1/FVC ratio: 75 %
Pre FEV6/FVC Ratio: 100 %
RV % PRED: 102 %
RV: 1.82 L
TLC % pred: 78 %
TLC: 4.99 L

## 2018-04-18 NOTE — Progress Notes (Signed)
PFT done today. 

## 2018-04-22 ENCOUNTER — Ambulatory Visit (HOSPITAL_BASED_OUTPATIENT_CLINIC_OR_DEPARTMENT_OTHER): Payer: Managed Care, Other (non HMO) | Attending: Cardiovascular Disease | Admitting: Cardiology

## 2018-04-22 VITALS — Ht 67.0 in | Wt 235.0 lb

## 2018-04-22 DIAGNOSIS — Z79899 Other long term (current) drug therapy: Secondary | ICD-10-CM | POA: Diagnosis not present

## 2018-04-22 DIAGNOSIS — Z6837 Body mass index (BMI) 37.0-37.9, adult: Secondary | ICD-10-CM | POA: Diagnosis not present

## 2018-04-22 DIAGNOSIS — Z7901 Long term (current) use of anticoagulants: Secondary | ICD-10-CM | POA: Diagnosis not present

## 2018-04-22 DIAGNOSIS — I491 Atrial premature depolarization: Secondary | ICD-10-CM | POA: Diagnosis not present

## 2018-04-22 DIAGNOSIS — R5383 Other fatigue: Secondary | ICD-10-CM | POA: Insufficient documentation

## 2018-04-22 DIAGNOSIS — I493 Ventricular premature depolarization: Secondary | ICD-10-CM | POA: Insufficient documentation

## 2018-04-22 DIAGNOSIS — I480 Paroxysmal atrial fibrillation: Secondary | ICD-10-CM | POA: Diagnosis not present

## 2018-04-22 DIAGNOSIS — E669 Obesity, unspecified: Secondary | ICD-10-CM | POA: Insufficient documentation

## 2018-04-22 DIAGNOSIS — G4733 Obstructive sleep apnea (adult) (pediatric): Secondary | ICD-10-CM | POA: Diagnosis not present

## 2018-04-25 NOTE — Procedures (Signed)
Patient Name: Charles Wyatt, Charles Wyatt Date: 04/22/2018 Gender: Male D.O.B: 1970-12-02 Age (years): 46 Referring Provider: [name] Height (inches): 57 Interpreting Physician: Fransico Him MD, ABSM Weight (lbs): 235 RPSGT: Baxter Flattery BMI: 37 MRN: [id number] Neck Size: 18.50  CLINICAL INFORMATION Sleep Study Type: Split Night CPAP  Indication for sleep study: Fatigue, Obesity, OSA, Snoring, Witnessed Apneas  Epworth Sleepiness Score: 9  SLEEP STUDY TECHNIQUE As per the AASM Manual for the Scoring of Sleep and Associated Events v2.3 (April 2016) with a hypopnea requiring 4% desaturations.  The channels recorded and monitored were frontal, central and occipital EEG, electrooculogram (EOG), submentalis EMG (chin), nasal and oral airflow, thoracic and abdominal wall motion, anterior tibialis EMG, snore microphone, electrocardiogram, and pulse oximetry. Continuous positive airway pressure (CPAP) was initiated when the patient met split night criteria and was titrated according to treat sleep-disordered breathing.  MEDICATIONS Medications self-administered by patient taken the night of the study : ELIQUIS, METOPROLOL  RESPIRATORY PARAMETERS Diagnostic Total AHI (/hr): 66.1  RDI (/hr):66.1  OA Index (/hr): 29.1  CA Index (/hr): 2.2 REM AHI (/hr): N/A  NREM AHI (/hr):66.1  Supine AHI (/hr):N/A  Non-supine AHI (/hr):66.09 Min O2 Sat (%):81.0  Mean O2 (%): 91.2  Time below 88% (min):26.9   Titration Optimal Pressure (cm):N/A  AHI at Optimal Pressure (/hr):N/A  Min O2 at Optimal Pressure (%):N/A Supine % at Optimal (%):N/A  Sleep % at Optimal (%):N/A   SLEEP ARCHITECTURE The recording time for the entire night was 398.6 minutes.  During a baseline period of 189.5 minutes, the patient slept for 138.0 minutes in REM and nonREM, yielding a sleep efficiency of 72.8%%. Sleep onset after lights out was 4.9 minutes with a REM latency of N/A minutes. The patient spent 23.6%%  of the night in stage N1 sleep, 76.4%% in stage N2 sleep, 0.0%% in stage N3 and 0.0%% in REM.  During the titration period of 205.4 minutes, the patient slept for 151.5 minutes in REM and nonREM, yielding a sleep efficiency of 73.8%%. Sleep onset after CPAP initiation was 18.1 minutes with a REM latency of 28.0 minutes. The patient spent 19.5%% of the night in stage N1 sleep, 53.1%% in stage N2 sleep, 0.0%% in stage N3 and 27.4%% in REM.  CARDIAC DATA The 2 lead EKG demonstrated sinus rhythm. The mean heart rate was 100.0 beats per minute. Other EKG findings include: PVCs and PACs.  LEG MOVEMENT DATA The total Periodic Limb Movements of Sleep (PLMS) were 0. The PLMS index was 0.0 .  IMPRESSIONS - Severe obstructive sleep apnea occurred during the diagnostic portion of the study (AHI = 66.1/hour). An optimal PAP pressure could not be selected for this patient due to ongoing respiratory events.   - Mild central sleep apnea occurred during the diagnostic portion of the study (CAI = 2.2/hour). - Moderate oxygen desaturation was noted during the diagnostic portion of the study (Min O2 =81.0%). - No snoring was audible during the diagnostic portion of the study. - PACs and PVCs were noted during this study. - Clinically significant periodic limb movements did not occur during sleep.  DIAGNOSIS - Obstructive Sleep Apnea (327.23 [G47.33 ICD-10])  RECOMMENDATIONS - Recommend full night CPAP titration - Avoid alcohol, sedatives and other CNS depressants that may worsen sleep apnea and disrupt normal sleep architecture. - Sleep hygiene should be reviewed to assess factors that may improve sleep quality. - Weight management and regular exercise should be initiated or continued.  [Electronically signed] 04/25/2018 10:26 PM  Traci  Turner MD, ABSM Diplomate, American Board of Sleep Medicine

## 2018-04-26 ENCOUNTER — Other Ambulatory Visit (HOSPITAL_BASED_OUTPATIENT_CLINIC_OR_DEPARTMENT_OTHER): Payer: Self-pay

## 2018-04-26 DIAGNOSIS — G4739 Other sleep apnea: Secondary | ICD-10-CM

## 2018-04-26 DIAGNOSIS — I480 Paroxysmal atrial fibrillation: Secondary | ICD-10-CM

## 2018-04-26 NOTE — Progress Notes (Signed)
Has severe sleep apnea should have f/u with Dr Mayford Knife

## 2018-04-28 ENCOUNTER — Telehealth: Payer: Self-pay | Admitting: *Deleted

## 2018-04-28 DIAGNOSIS — G4739 Other sleep apnea: Secondary | ICD-10-CM

## 2018-04-28 NOTE — Telephone Encounter (Signed)
Called results LMTCB 

## 2018-04-28 NOTE — Addendum Note (Signed)
Addended by: Reesa Chew on: 04/28/2018 02:37 PM   Modules accepted: Orders

## 2018-04-28 NOTE — Telephone Encounter (Signed)
Informed patient of sleep study results and patient understanding was verbalized. Pt is aware they have sleep apnea but could not be adequately Titrated on during the split night study. Patient is aware Dr Mayford Knife recommends a full night CPAP titration in the sleep lab. Pt is aware and agreeable to treatment.

## 2018-04-28 NOTE — Telephone Encounter (Signed)
-----   Message from Quintella Reichert, MD sent at 04/25/2018 10:31 PM EDT ----- Please let patient know that they have sleep apnea but could not be adequately  Titrated on during the split night study.  Recommend full night CPAP titration. Please set up titration in the sleep lab.

## 2018-05-14 IMAGING — DX DG CHEST 2V
2 series · 2 of 2 positions shown · non-contrast
Comparison: Two-view chest x-ray 08/23/2017.

CLINICAL DATA: Productive cough for 2 days.  Shortness of breath.

EXAM:
CHEST  2 VIEW

[chest pa]
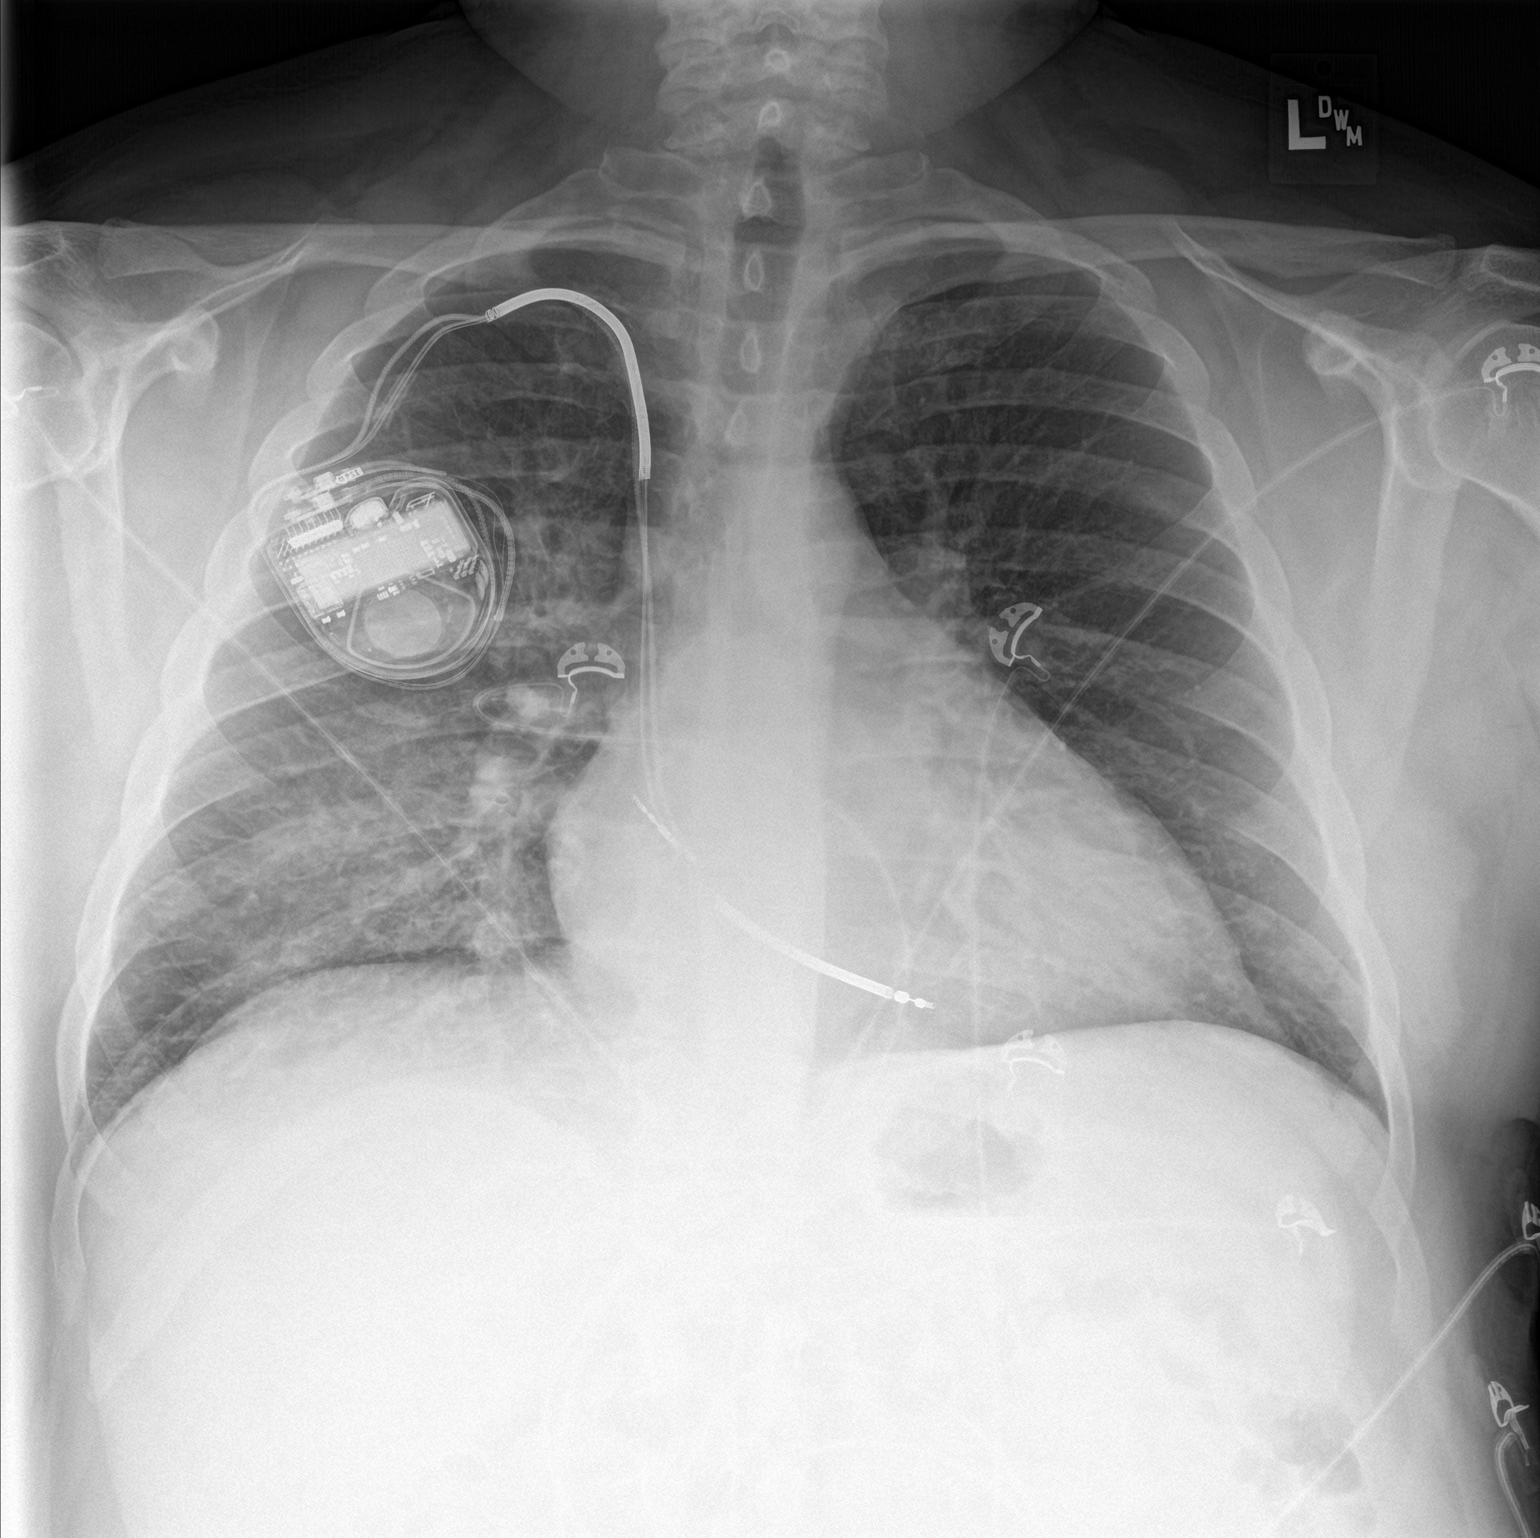

[chest lat]
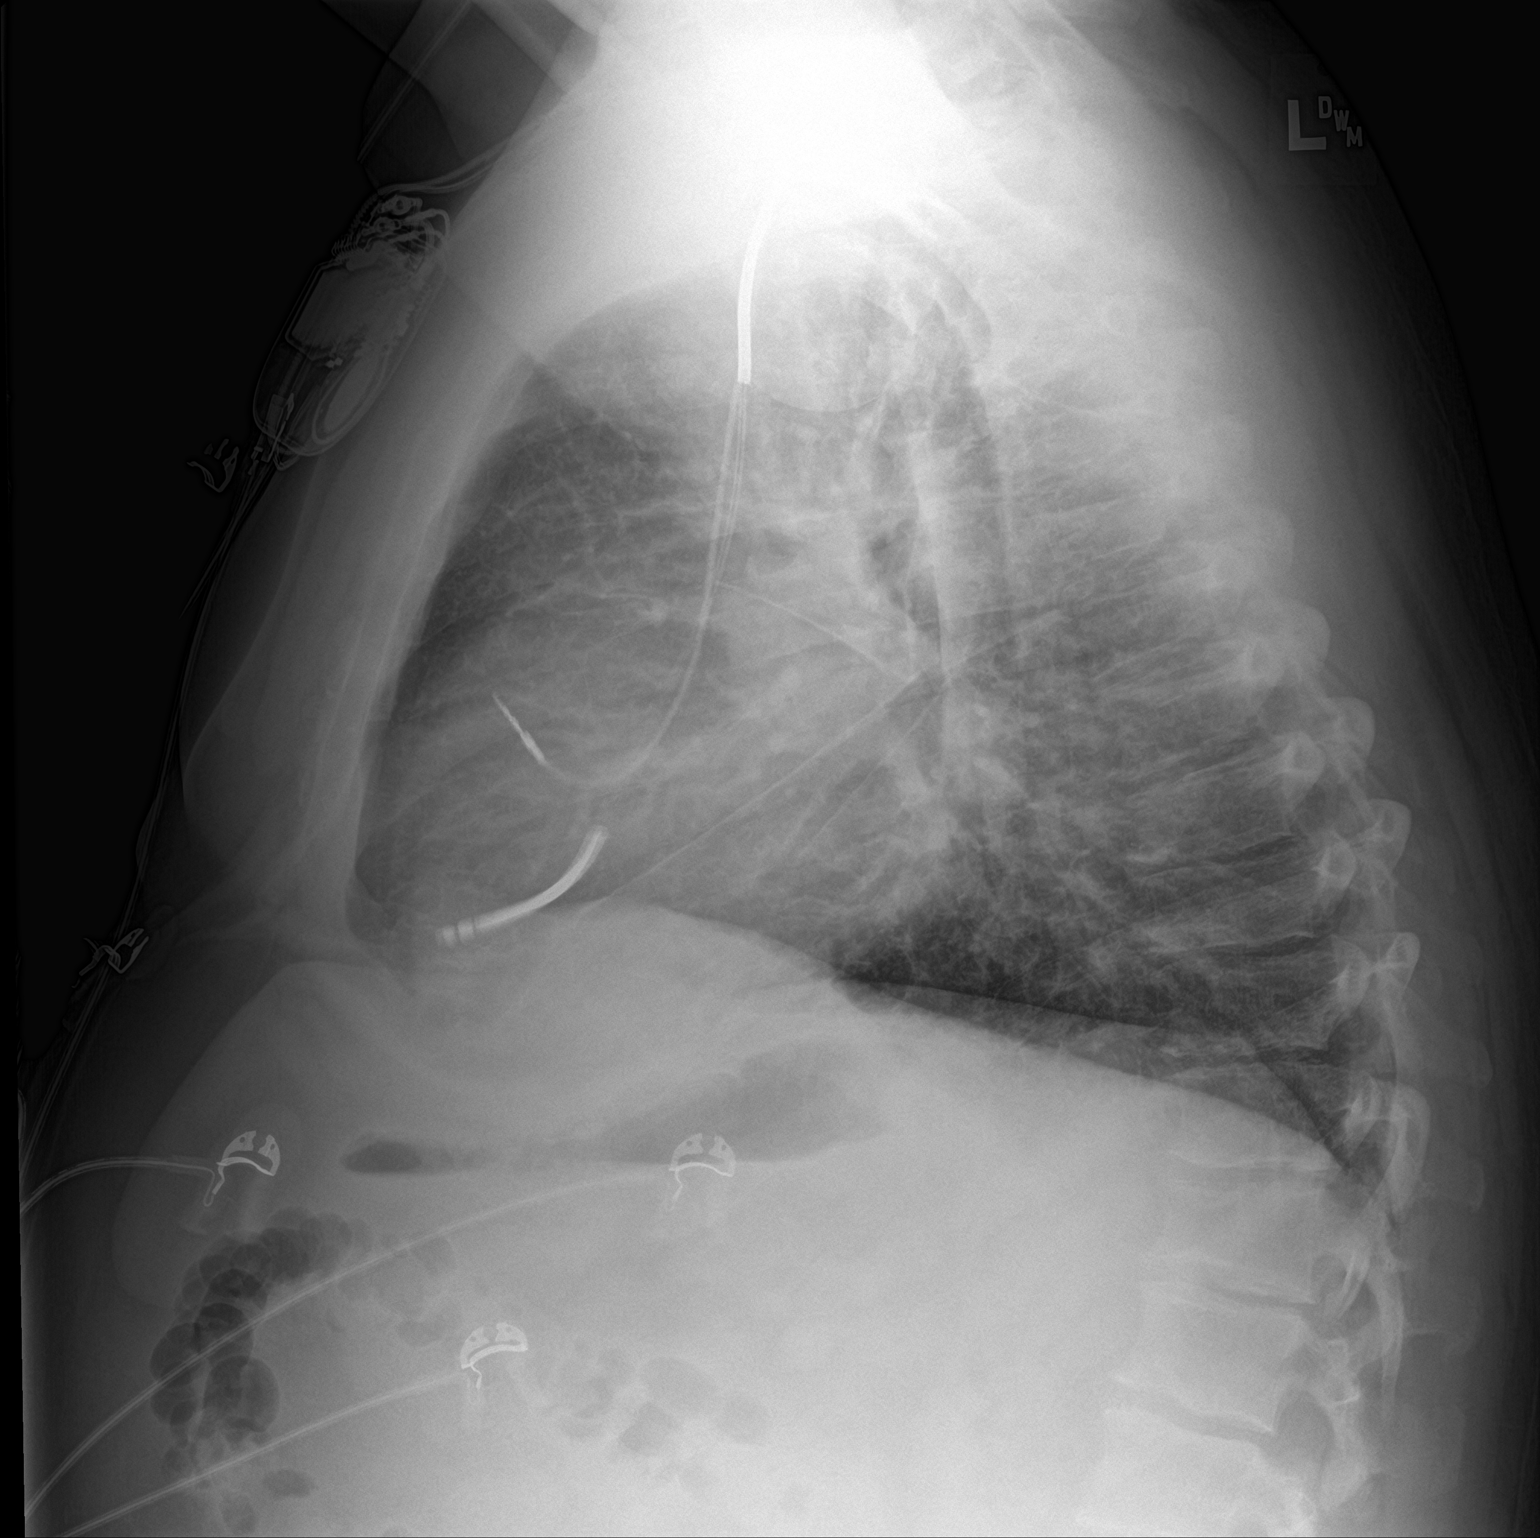

[2 of 2 positions shown; findings below may reference images not displayed]

FINDINGS: The heart is mildly enlarged. Right subclavian pacemaker/AICD is
stable.

New right lower lobe pneumonia is present. The left lung is clear.
The visualized soft tissues and bony thorax are unremarkable.
IMPRESSION: 1. New right lower lobe pneumonia.
2. Mild cardiomegaly without failure.

## 2018-05-23 ENCOUNTER — Ambulatory Visit (INDEPENDENT_AMBULATORY_CARE_PROVIDER_SITE_OTHER): Payer: Managed Care, Other (non HMO) | Admitting: *Deleted

## 2018-05-23 DIAGNOSIS — I422 Other hypertrophic cardiomyopathy: Secondary | ICD-10-CM

## 2018-05-23 NOTE — Progress Notes (Signed)
Remote ICD transmission.   

## 2018-05-24 ENCOUNTER — Encounter: Payer: Self-pay | Admitting: Cardiology

## 2018-05-25 LAB — CUP PACEART REMOTE DEVICE CHECK
Battery Remaining Longevity: 129 mo
Brady Statistic AP VS Percent: 1.8 %
Brady Statistic AS VP Percent: 0.03 %
Brady Statistic RA Percent Paced: 5.09 %
Brady Statistic RV Percent Paced: 3.34 %
Date Time Interrogation Session: 20190624062725
HIGH POWER IMPEDANCE MEASURED VALUE: 53 Ohm
HIGH POWER IMPEDANCE MEASURED VALUE: 72 Ohm
Implantable Lead Implant Date: 20110921
Implantable Lead Location: 753859
Implantable Lead Model: 5076
Lead Channel Impedance Value: 304 Ohm
Lead Channel Impedance Value: 475 Ohm
Lead Channel Pacing Threshold Amplitude: 0.625 V
Lead Channel Pacing Threshold Pulse Width: 0.4 ms
Lead Channel Pacing Threshold Pulse Width: 0.4 ms
Lead Channel Sensing Intrinsic Amplitude: 9.75 mV
Lead Channel Setting Pacing Amplitude: 2 V
Lead Channel Setting Pacing Amplitude: 2.5 V
Lead Channel Setting Pacing Pulse Width: 0.4 ms
Lead Channel Setting Sensing Sensitivity: 0.3 mV
MDC IDC LEAD IMPLANT DT: 20110921
MDC IDC LEAD LOCATION: 753860
MDC IDC MSMT BATTERY VOLTAGE: 3.05 V
MDC IDC MSMT LEADCHNL RA PACING THRESHOLD AMPLITUDE: 0.75 V
MDC IDC MSMT LEADCHNL RA SENSING INTR AMPL: 4.25 mV
MDC IDC MSMT LEADCHNL RA SENSING INTR AMPL: 4.25 mV
MDC IDC MSMT LEADCHNL RV IMPEDANCE VALUE: 361 Ohm
MDC IDC MSMT LEADCHNL RV SENSING INTR AMPL: 9.75 mV
MDC IDC PG IMPLANT DT: 20181231
MDC IDC STAT BRADY AP VP PERCENT: 3.3 %
MDC IDC STAT BRADY AS VS PERCENT: 94.88 %

## 2018-06-24 ENCOUNTER — Encounter: Payer: Self-pay | Admitting: Internal Medicine

## 2018-06-24 ENCOUNTER — Ambulatory Visit: Payer: Managed Care, Other (non HMO) | Admitting: Internal Medicine

## 2018-06-24 VITALS — BP 110/82 | HR 74 | Ht 67.0 in | Wt 244.8 lb

## 2018-06-24 DIAGNOSIS — I422 Other hypertrophic cardiomyopathy: Secondary | ICD-10-CM

## 2018-06-24 DIAGNOSIS — Z95810 Presence of automatic (implantable) cardiac defibrillator: Secondary | ICD-10-CM | POA: Diagnosis not present

## 2018-06-24 DIAGNOSIS — I480 Paroxysmal atrial fibrillation: Secondary | ICD-10-CM | POA: Diagnosis not present

## 2018-06-24 LAB — CBC WITH DIFFERENTIAL/PLATELET
BASOS ABS: 0 10*3/uL (ref 0.0–0.2)
Basos: 0 %
EOS (ABSOLUTE): 0.1 10*3/uL (ref 0.0–0.4)
Eos: 1 %
HEMOGLOBIN: 15.1 g/dL (ref 13.0–17.7)
Hematocrit: 44.4 % (ref 37.5–51.0)
IMMATURE GRANS (ABS): 0 10*3/uL (ref 0.0–0.1)
IMMATURE GRANULOCYTES: 1 %
LYMPHS: 22 %
Lymphocytes Absolute: 1.6 10*3/uL (ref 0.7–3.1)
MCH: 33.3 pg — AB (ref 26.6–33.0)
MCHC: 34 g/dL (ref 31.5–35.7)
MCV: 98 fL — ABNORMAL HIGH (ref 79–97)
MONOCYTES: 7 %
Monocytes Absolute: 0.5 10*3/uL (ref 0.1–0.9)
NEUTROS ABS: 4.9 10*3/uL (ref 1.4–7.0)
NEUTROS PCT: 69 %
Platelets: 325 10*3/uL (ref 150–450)
RBC: 4.54 x10E6/uL (ref 4.14–5.80)
RDW: 13.6 % (ref 12.3–15.4)
WBC: 7 10*3/uL (ref 3.4–10.8)

## 2018-06-24 LAB — CUP PACEART INCLINIC DEVICE CHECK
Battery Remaining Longevity: 129 mo
Battery Voltage: 3.04 V
Brady Statistic AP VS Percent: 1.8 %
Brady Statistic AS VS Percent: 95.05 %
Brady Statistic RA Percent Paced: 4.91 %
Date Time Interrogation Session: 20190726090819
HIGH POWER IMPEDANCE MEASURED VALUE: 48 Ohm
HIGH POWER IMPEDANCE MEASURED VALUE: 61 Ohm
Implantable Lead Implant Date: 20110921
Implantable Lead Implant Date: 20110921
Implantable Lead Location: 753860
Implantable Lead Model: 5076
Implantable Pulse Generator Implant Date: 20181231
Lead Channel Impedance Value: 304 Ohm
Lead Channel Impedance Value: 361 Ohm
Lead Channel Impedance Value: 475 Ohm
Lead Channel Pacing Threshold Amplitude: 0.75 V
Lead Channel Pacing Threshold Pulse Width: 0.4 ms
Lead Channel Pacing Threshold Pulse Width: 0.4 ms
Lead Channel Sensing Intrinsic Amplitude: 10.5 mV
Lead Channel Setting Pacing Amplitude: 2 V
Lead Channel Setting Sensing Sensitivity: 0.3 mV
MDC IDC LEAD LOCATION: 753859
MDC IDC MSMT LEADCHNL RA SENSING INTR AMPL: 4.125 mV
MDC IDC MSMT LEADCHNL RA SENSING INTR AMPL: 4.5 mV
MDC IDC MSMT LEADCHNL RV PACING THRESHOLD AMPLITUDE: 0.625 V
MDC IDC MSMT LEADCHNL RV SENSING INTR AMPL: 9.5 mV
MDC IDC SET LEADCHNL RV PACING AMPLITUDE: 2.5 V
MDC IDC SET LEADCHNL RV PACING PULSEWIDTH: 0.4 ms
MDC IDC STAT BRADY AP VP PERCENT: 3.11 %
MDC IDC STAT BRADY AS VP PERCENT: 0.03 %
MDC IDC STAT BRADY RV PERCENT PACED: 3.12 %

## 2018-06-24 LAB — BASIC METABOLIC PANEL
BUN / CREAT RATIO: 11 (ref 9–20)
BUN: 16 mg/dL (ref 6–24)
CO2: 21 mmol/L (ref 20–29)
CREATININE: 1.46 mg/dL — AB (ref 0.76–1.27)
Calcium: 9.8 mg/dL (ref 8.7–10.2)
Chloride: 101 mmol/L (ref 96–106)
GFR calc Af Amer: 66 mL/min/{1.73_m2} (ref >59)
GFR calc non Af Amer: 57 mL/min/{1.73_m2} — ABNORMAL LOW (ref >59)
GLUCOSE: 110 mg/dL — AB (ref 65–99)
Potassium: 4.3 mmol/L (ref 3.5–5.2)
Sodium: 138 mmol/L (ref 134–144)

## 2018-06-24 LAB — HEPATIC FUNCTION PANEL
ALBUMIN: 4.5 g/dL (ref 3.5–5.5)
ALK PHOS: 71 IU/L (ref 39–117)
ALT: 20 IU/L (ref 0–44)
AST: 15 IU/L (ref 0–40)
BILIRUBIN, DIRECT: 0.14 mg/dL (ref 0.00–0.40)
Bilirubin Total: 0.6 mg/dL (ref 0.0–1.2)
TOTAL PROTEIN: 6.8 g/dL (ref 6.0–8.5)

## 2018-06-24 LAB — TSH: TSH: 4.38 u[IU]/mL (ref 0.450–4.500)

## 2018-06-24 NOTE — Progress Notes (Signed)
Patient Care Team: Sharlene Dory, DO as PCP - General (Family Medicine) Wendall Stade, MD as PCP - Cardiology (Cardiology) Duke Salvia, MD as Consulting Physician (Cardiology)   HPI  Charles Wyatt is a 47 y.o. male seen in followup for an ICD  The device originally implanted for HCM in the setting of a family history of sudden death and significant septal thickness and hyperenhancing on cardiac MRI. He has hx of inappropriate shock from AFib RVR. On apixoban  He underwent ICD generator replacement 12/18.   He was seen in the emergency room 11/05/16 following 3 ICD shocks. They were recorded by the fellow as appropriate. He declined admission  Review of these data however demonstrates that he had nonsustained ventricular tachycardia and shocks were delivered because of commitment following charging on shock 1 and with recurrent nonsustained ventricular tachycardia, subsequent shocks. This occurred in the context of having taken a new weight loss stimulants. He was also not taking his beta blocker.  His family history was reviewed. It seems that the gene runs through his mother family and a large kindred has been identified and is being followed in [place] and [place]   He was admitted 9/18 w recurrent AFib with RVR; he was started on amiodarone.  He saw Dr. Fawn Kirk to consider ablation; his low atrial fibrillation burden and left atrial size it was elected at that time to continue medical therapy    Date Cr K TSH LFT Hgb  9/18   4.04     1/19  1.4 4.3 5.29 22 14.8   3/19   4.03     DATE TEST    9/17 Echo    EF 50-55 %   10/18 Echo    EF 50 % LVH/ LAE (5.8/2.6/67)  MR mile        He has periodic spells where he feels crummy.  No specific chest discomfort or shortness of breath is noted he has recorded his heart rate and blood pressure is elevated with the spells 150/105 and 95 or so.  Typically abating after an hour or 2.   Past Medical History:  Diagnosis Date    . Biatrial enlargement   . HCM    06 19/2011 ?[facility] ?Tee to R/o subaortic membrane   . Implantable cardiac defibrillator Medtronic   . 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 Persistent atrial fibrillation (HCC)    Inappro shock  . Snoring     Past Surgical History:  Procedure Laterality Date  . CARDIOVERSION N/A 08/20/2017   Procedure: CARDIOVERSION;  Surgeon: Quintella Reichert, MD;  Location: [facility] ENDOSCOPY;  Service: Cardiovascular;  Laterality: N/A;  . CARDIOVERSION N/A 08/24/2017   Procedure: CARDIOVERSION;  Surgeon: Jake Bathe, MD;  Location: [facility] ENDOSCOPY;  Service: Cardiovascular;  Laterality: N/A;  . Defibrillator implantation     Medtronic Protecta 314 DRG ICD implanted by [name] for primary prevention  . PPM GENERATOR CHANGEOUT N/A 11/29/2017   EVERA MRI XT DR ICD implanted by [name] for primary prevention  . TEE WITHOUT CARDIOVERSION N/A 08/20/2017   Procedure: TRANSESOPHAGEAL ECHOCARDIOGRAM (TEE);  Surgeon: Quintella Reichert, MD;  Location: [facility] ENDOSCOPY;  Service: Cardiovascular;  Laterality: N/A;    Current Outpatient Medications  Medication Sig Dispense Refill  . acetaminophen (TYLENOL) 500 MG tablet Take 1,000 mg by mouth every 6 (six) hours as needed for mild pain or headache.    Marland Kitchen amiodarone (PACERONE) 200 MG tablet Take 0.5  tablets (100 mg total) by mouth daily. 45 tablet 1  . apixaban (ELIQUIS) 5 MG TABS tablet Take 1 tablet (5 mg total) by mouth 2 (two) times daily. 60 tablet 6  . KLOR-CON M20 20 MEQ tablet TAKE 2 TABLETS (40 MEQ TOTAL) BY MOUTH DAILY. 60 tablet 7  . metoprolol tartrate (LOPRESSOR) 50 MG tablet Take 1 tablet (50 mg total) by mouth 2 (two) times daily. 180 tablet 3   No current facility-administered medications for this visit.     No Known Allergies    Review of Systems negative except from HPI and PMH  Physical Exam BP 110/82   Pulse 74   Ht 5\' 7"  (1.702 m)   Wt 244 lb 13.6 oz (111.1 kg)   SpO2 98%   BMI 38.35 kg/m  Well developed  and nourished in no acute distress HENT normal Neck supple with JVP-flat Clear Device pocket well healed; without hematoma or erythema.  There is no tethering  Regular rate and rhythm, no murmurs or gallops Abd-soft with active BS No Clubbing cyanosis edema Skin-warm and dry A & Oriented  Grossly normal sensory and motor function    ECG demonstrates sinus at 74 Intervals 18/11/38 Axis left -35  Assessment and  Plan HCM  ICD Medtronic   Ventricular tachycardia-nonsustained  Obesity  Atrial fibrillation with a rapid ventricular response  OSA  Tolerating amio at low dose  No interval afib or VT  Not sure the mechanism of his feeling lousy spells with elevated blood pressure and elevated heart rate.  We will use [employer] monitor to try to identify the rhythm.  Suggested that he take an extra metoprolol as needed  We spent more than 50% of our >25 min visit in face to face counseling regarding the above

## 2018-06-24 NOTE — Patient Instructions (Signed)
Medication Instructions:  Your physician recommends that you continue on your current medications as directed. Please refer to the Current Medication list given to you today.  Labwork: You will have labs drawn today: CBC, BMP, LFT, and TSH  Testing/Procedures: None ordered.  Follow-Up: Your physician wants you to follow-up in: 6 months with Francis Dowseenee Ursuy. You will receive a reminder letter in the mail two months in advance. If you don't receive a letter, please call our office to schedule the follow-up appointment.  Remote monitoring is used to monitor your ICD from home. This monitoring reduces the number of office visits required to check your device to one time per year. It allows us to keep an eye on the functioning of your device to ensure it is working properly. You are scheduled for a device check from home on 08/22/2018. You may send your transmission at any time that day. If you have a wireless device, the transmission will be sent automatically. After your physician reviews your transmission, you will receive a postcard with your next transmission date.    Any Other Special Instructions Will Be Listed Below (If Applicable).     If you need a refill on your cardiac medications before your next appointment, please call your pharmacy.

## 2018-07-24 ENCOUNTER — Other Ambulatory Visit: Payer: Self-pay | Admitting: Cardiovascular Disease

## 2018-08-14 IMAGING — DX DG CHEST 2V
2 series · 2 of 2 positions shown · non-contrast
Comparison: 10/01/2017

CLINICAL DATA: Cough, wheezing, low-grade fever

EXAM:
CHEST  2 VIEW

[chest pa]
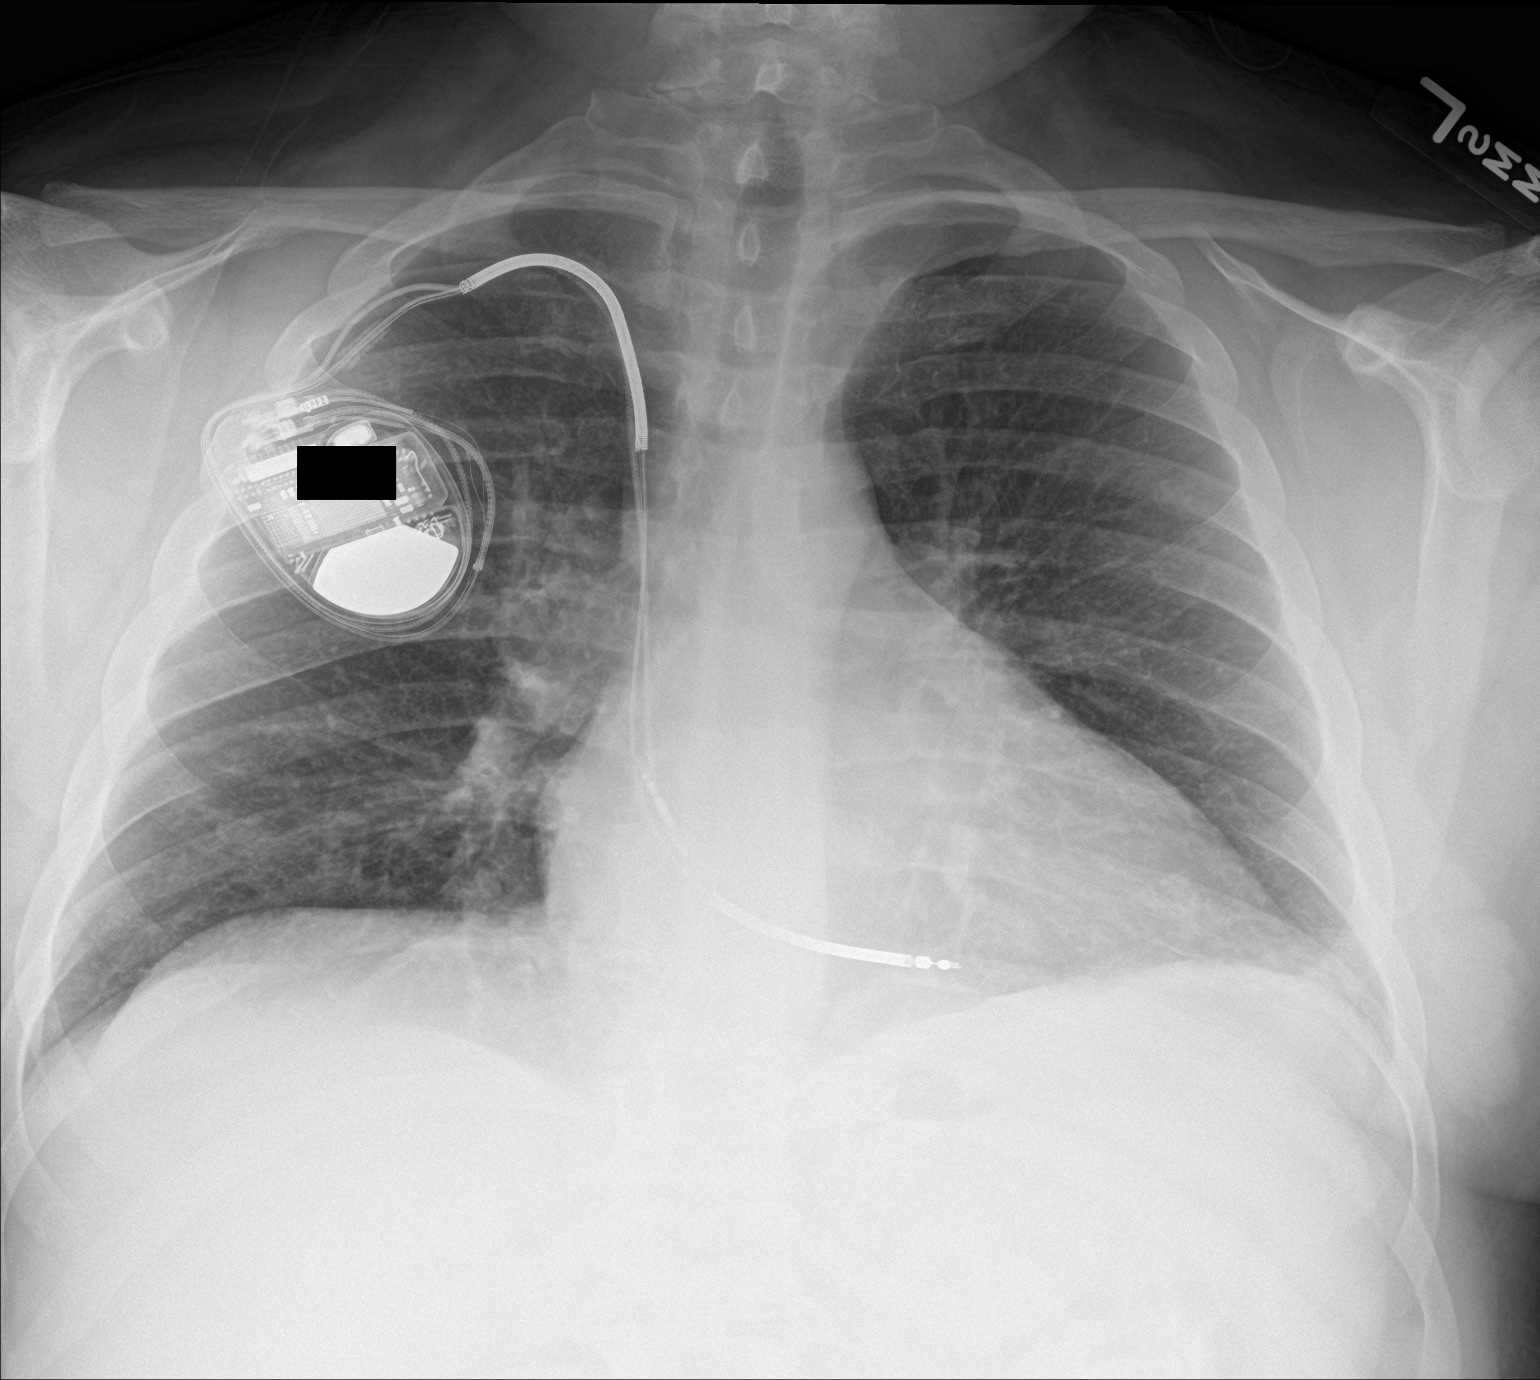

[chest lat]
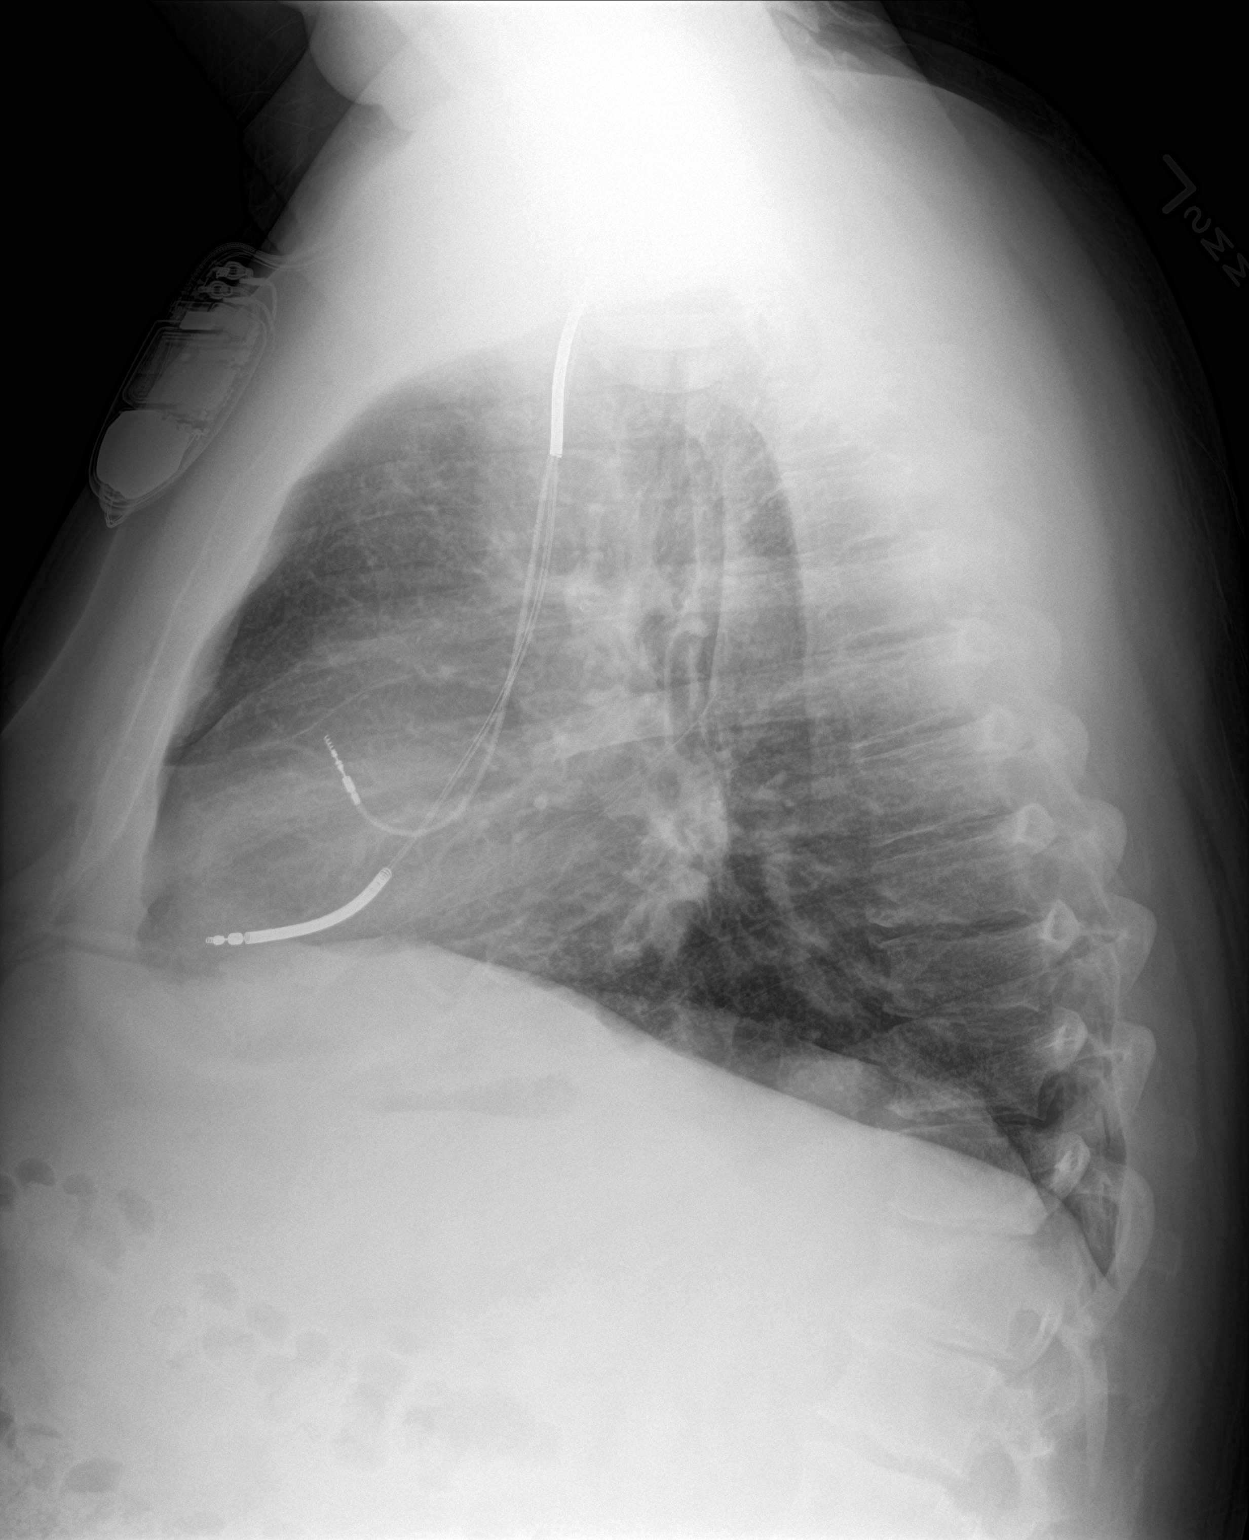

[2 of 2 positions shown; findings below may reference images not displayed]

FINDINGS: Lungs are clear.  No pleural effusion or pneumothorax.

The heart is top-normal in size.  Right subclavian ICD.

Visualized osseous structures are within normal limits.
IMPRESSION: Normal chest radiographs.

## 2018-08-22 ENCOUNTER — Ambulatory Visit (INDEPENDENT_AMBULATORY_CARE_PROVIDER_SITE_OTHER): Payer: Managed Care, Other (non HMO) | Admitting: *Deleted

## 2018-08-22 DIAGNOSIS — I517 Cardiomegaly: Secondary | ICD-10-CM

## 2018-08-22 NOTE — Progress Notes (Signed)
Remote ICD transmission.   

## 2018-08-23 ENCOUNTER — Encounter: Payer: Self-pay | Admitting: Cardiology

## 2018-09-09 LAB — CUP PACEART REMOTE DEVICE CHECK
Battery Voltage: 3.02 V
Brady Statistic AP VP Percent: 1.84 %
Brady Statistic AS VP Percent: 0.03 %
Brady Statistic AS VS Percent: 96.8 %
Date Time Interrogation Session: 20190923062605
HighPow Impedance: 52 Ohm
HighPow Impedance: 69 Ohm
Implantable Lead Implant Date: 20110921
Implantable Lead Location: 753860
Implantable Lead Model: 6947
Implantable Pulse Generator Implant Date: 20181231
Lead Channel Impedance Value: 361 Ohm
Lead Channel Pacing Threshold Amplitude: 0.625 V
Lead Channel Sensing Intrinsic Amplitude: 10.125 mV
Lead Channel Sensing Intrinsic Amplitude: 10.125 mV
Lead Channel Sensing Intrinsic Amplitude: 4.25 mV
Lead Channel Sensing Intrinsic Amplitude: 4.25 mV
Lead Channel Setting Pacing Pulse Width: 0.4 ms
MDC IDC LEAD IMPLANT DT: 20110921
MDC IDC LEAD LOCATION: 753859
MDC IDC MSMT BATTERY REMAINING LONGEVITY: 128 mo
MDC IDC MSMT LEADCHNL RA IMPEDANCE VALUE: 475 Ohm
MDC IDC MSMT LEADCHNL RA PACING THRESHOLD PULSEWIDTH: 0.4 ms
MDC IDC MSMT LEADCHNL RV IMPEDANCE VALUE: 304 Ohm
MDC IDC MSMT LEADCHNL RV PACING THRESHOLD AMPLITUDE: 0.625 V
MDC IDC MSMT LEADCHNL RV PACING THRESHOLD PULSEWIDTH: 0.4 ms
MDC IDC SET LEADCHNL RA PACING AMPLITUDE: 2 V
MDC IDC SET LEADCHNL RV PACING AMPLITUDE: 2.5 V
MDC IDC SET LEADCHNL RV SENSING SENSITIVITY: 0.3 mV
MDC IDC STAT BRADY AP VS PERCENT: 1.34 %
MDC IDC STAT BRADY RA PERCENT PACED: 3.17 %
MDC IDC STAT BRADY RV PERCENT PACED: 1.82 %

## 2018-10-03 ENCOUNTER — Other Ambulatory Visit: Payer: Self-pay | Admitting: Cardiovascular Disease

## 2018-10-03 DIAGNOSIS — E876 Hypokalemia: Secondary | ICD-10-CM

## 2018-11-21 ENCOUNTER — Ambulatory Visit (INDEPENDENT_AMBULATORY_CARE_PROVIDER_SITE_OTHER): Payer: Managed Care, Other (non HMO)

## 2018-11-21 DIAGNOSIS — I422 Other hypertrophic cardiomyopathy: Secondary | ICD-10-CM

## 2018-11-21 NOTE — Progress Notes (Signed)
Remote ICD transmission.   

## 2018-11-22 LAB — CUP PACEART REMOTE DEVICE CHECK
Brady Statistic AP VS Percent: 1.42 %
Brady Statistic AS VP Percent: 0.03 %
Brady Statistic AS VS Percent: 97.01 %
Date Time Interrogation Session: 20191223062504
HighPow Impedance: 54 Ohm
HighPow Impedance: 76 Ohm
Implantable Lead Implant Date: 20110921
Implantable Lead Location: 753859
Lead Channel Impedance Value: 513 Ohm
Lead Channel Pacing Threshold Amplitude: 0.625 V
Lead Channel Pacing Threshold Amplitude: 0.75 V
Lead Channel Pacing Threshold Pulse Width: 0.4 ms
Lead Channel Sensing Intrinsic Amplitude: 3.875 mV
Lead Channel Sensing Intrinsic Amplitude: 3.875 mV
Lead Channel Setting Pacing Amplitude: 2.5 V
MDC IDC LEAD IMPLANT DT: 20110921
MDC IDC LEAD LOCATION: 753860
MDC IDC MSMT BATTERY REMAINING LONGEVITY: 125 mo
MDC IDC MSMT BATTERY VOLTAGE: 3 V
MDC IDC MSMT LEADCHNL RA PACING THRESHOLD PULSEWIDTH: 0.4 ms
MDC IDC MSMT LEADCHNL RV IMPEDANCE VALUE: 304 Ohm
MDC IDC MSMT LEADCHNL RV IMPEDANCE VALUE: 399 Ohm
MDC IDC MSMT LEADCHNL RV SENSING INTR AMPL: 11.25 mV
MDC IDC MSMT LEADCHNL RV SENSING INTR AMPL: 11.25 mV
MDC IDC PG IMPLANT DT: 20181231
MDC IDC SET LEADCHNL RA PACING AMPLITUDE: 2 V
MDC IDC SET LEADCHNL RV PACING PULSEWIDTH: 0.4 ms
MDC IDC SET LEADCHNL RV SENSING SENSITIVITY: 0.3 mV
MDC IDC STAT BRADY AP VP PERCENT: 1.54 %
MDC IDC STAT BRADY RA PERCENT PACED: 2.96 %
MDC IDC STAT BRADY RV PERCENT PACED: 1.55 %

## 2019-01-14 ENCOUNTER — Other Ambulatory Visit: Payer: Self-pay | Admitting: Cardiovascular Disease

## 2019-01-14 DIAGNOSIS — I480 Paroxysmal atrial fibrillation: Secondary | ICD-10-CM

## 2019-02-07 ENCOUNTER — Other Ambulatory Visit: Payer: Self-pay | Admitting: Internal Medicine

## 2019-02-19 ENCOUNTER — Other Ambulatory Visit: Payer: Self-pay | Admitting: Cardiovascular Disease

## 2019-02-20 ENCOUNTER — Other Ambulatory Visit: Payer: Self-pay

## 2019-02-20 ENCOUNTER — Ambulatory Visit (INDEPENDENT_AMBULATORY_CARE_PROVIDER_SITE_OTHER): Payer: Managed Care, Other (non HMO) | Admitting: *Deleted

## 2019-02-20 DIAGNOSIS — I422 Other hypertrophic cardiomyopathy: Secondary | ICD-10-CM

## 2019-02-20 DIAGNOSIS — I480 Paroxysmal atrial fibrillation: Secondary | ICD-10-CM

## 2019-02-21 LAB — CUP PACEART REMOTE DEVICE CHECK
Battery Remaining Longevity: 123 mo
Battery Voltage: 2.98 V
Brady Statistic AP VP Percent: 1.12 %
Brady Statistic AS VS Percent: 97.89 %
Brady Statistic RA Percent Paced: 2.07 %
Brady Statistic RV Percent Paced: 1.09 %
Date Time Interrogation Session: 20200323082205
HIGH POWER IMPEDANCE MEASURED VALUE: 59 Ohm
HighPow Impedance: 46 Ohm
Implantable Lead Implant Date: 20110921
Implantable Lead Location: 753860
Implantable Lead Model: 5076
Implantable Lead Model: 6947
Implantable Pulse Generator Implant Date: 20181231
Lead Channel Impedance Value: 304 Ohm
Lead Channel Impedance Value: 361 Ohm
Lead Channel Impedance Value: 456 Ohm
Lead Channel Pacing Threshold Pulse Width: 0.4 ms
Lead Channel Sensing Intrinsic Amplitude: 3.75 mV
Lead Channel Sensing Intrinsic Amplitude: 6.875 mV
Lead Channel Sensing Intrinsic Amplitude: 6.875 mV
Lead Channel Setting Pacing Amplitude: 2 V
Lead Channel Setting Pacing Pulse Width: 0.4 ms
Lead Channel Setting Sensing Sensitivity: 0.3 mV
MDC IDC LEAD IMPLANT DT: 20110921
MDC IDC LEAD LOCATION: 753859
MDC IDC MSMT LEADCHNL RA PACING THRESHOLD AMPLITUDE: 0.75 V
MDC IDC MSMT LEADCHNL RA SENSING INTR AMPL: 3.75 mV
MDC IDC MSMT LEADCHNL RV PACING THRESHOLD AMPLITUDE: 0.625 V
MDC IDC MSMT LEADCHNL RV PACING THRESHOLD PULSEWIDTH: 0.4 ms
MDC IDC SET LEADCHNL RV PACING AMPLITUDE: 2.5 V
MDC IDC STAT BRADY AP VS PERCENT: 0.95 %
MDC IDC STAT BRADY AS VP PERCENT: 0.03 %

## 2019-02-28 NOTE — Progress Notes (Signed)
Remote ICD transmission.   

## 2019-05-22 ENCOUNTER — Ambulatory Visit (INDEPENDENT_AMBULATORY_CARE_PROVIDER_SITE_OTHER): Payer: Managed Care, Other (non HMO) | Admitting: *Deleted

## 2019-05-22 DIAGNOSIS — I422 Other hypertrophic cardiomyopathy: Secondary | ICD-10-CM | POA: Diagnosis not present

## 2019-05-23 LAB — CUP PACEART REMOTE DEVICE CHECK
Battery Remaining Longevity: 120 mo
Battery Voltage: 2.96 V
Brady Statistic AP VP Percent: 1.37 %
Brady Statistic AP VS Percent: 0.95 %
Brady Statistic AS VP Percent: 0.03 %
Brady Statistic AS VS Percent: 97.65 %
Brady Statistic RA Percent Paced: 2.32 %
Brady Statistic RV Percent Paced: 1.33 %
Date Time Interrogation Session: 20200623011707
HighPow Impedance: 52 Ohm
HighPow Impedance: 69 Ohm
Implantable Lead Implant Date: 20110921
Implantable Lead Implant Date: 20110921
Implantable Lead Location: 753859
Implantable Lead Location: 753860
Implantable Lead Model: 5076
Implantable Lead Model: 6947
Implantable Pulse Generator Implant Date: 20181231
Lead Channel Impedance Value: 304 Ohm
Lead Channel Impedance Value: 361 Ohm
Lead Channel Impedance Value: 475 Ohm
Lead Channel Pacing Threshold Amplitude: 0.625 V
Lead Channel Pacing Threshold Amplitude: 0.875 V
Lead Channel Pacing Threshold Pulse Width: 0.4 ms
Lead Channel Pacing Threshold Pulse Width: 0.4 ms
Lead Channel Sensing Intrinsic Amplitude: 3.875 mV
Lead Channel Sensing Intrinsic Amplitude: 3.875 mV
Lead Channel Sensing Intrinsic Amplitude: 6.5 mV
Lead Channel Sensing Intrinsic Amplitude: 6.5 mV
Lead Channel Setting Pacing Amplitude: 2 V
Lead Channel Setting Pacing Amplitude: 2.5 V
Lead Channel Setting Pacing Pulse Width: 0.4 ms
Lead Channel Setting Sensing Sensitivity: 0.3 mV

## 2019-05-30 NOTE — Progress Notes (Signed)
Remote ICD transmission.   

## 2019-07-12 ENCOUNTER — Other Ambulatory Visit: Payer: Self-pay | Admitting: Cardiovascular Disease

## 2019-07-12 DIAGNOSIS — I480 Paroxysmal atrial fibrillation: Secondary | ICD-10-CM

## 2019-07-12 MED ORDER — METOPROLOL TARTRATE 50 MG PO TABS: 50.0000 mg | ORAL_TABLET | Freq: Two times a day (BID) | ORAL | 0 refills | Status: DC

## 2019-08-08 ENCOUNTER — Other Ambulatory Visit: Payer: Self-pay | Admitting: Cardiovascular Disease

## 2019-08-08 DIAGNOSIS — I480 Paroxysmal atrial fibrillation: Secondary | ICD-10-CM

## 2019-08-15 ENCOUNTER — Other Ambulatory Visit: Payer: Self-pay | Admitting: Cardiovascular Disease

## 2019-08-22 ENCOUNTER — Ambulatory Visit (INDEPENDENT_AMBULATORY_CARE_PROVIDER_SITE_OTHER): Payer: Managed Care, Other (non HMO) | Admitting: *Deleted

## 2019-08-22 DIAGNOSIS — I4891 Unspecified atrial fibrillation: Secondary | ICD-10-CM

## 2019-08-22 DIAGNOSIS — I422 Other hypertrophic cardiomyopathy: Secondary | ICD-10-CM

## 2019-08-22 LAB — CUP PACEART REMOTE DEVICE CHECK
Battery Remaining Longevity: 89 mo
Battery Voltage: 2.96 V
Brady Statistic AP VP Percent: 0.26 %
Brady Statistic AP VS Percent: 0.5 %
Brady Statistic AS VP Percent: 0.03 %
Brady Statistic AS VS Percent: 99.22 %
Brady Statistic RA Percent Paced: 0.75 %
Brady Statistic RV Percent Paced: 0.28 %
Date Time Interrogation Session: 20200922083725
HighPow Impedance: 49 Ohm
HighPow Impedance: 61 Ohm
Implantable Lead Implant Date: 20110921
Implantable Lead Implant Date: 20110921
Implantable Lead Location: 753859
Implantable Lead Location: 753860
Implantable Lead Model: 5076
Implantable Lead Model: 6947
Implantable Pulse Generator Implant Date: 20181231
Lead Channel Impedance Value: 304 Ohm
Lead Channel Impedance Value: 342 Ohm
Lead Channel Impedance Value: 456 Ohm
Lead Channel Pacing Threshold Amplitude: 0.625 V
Lead Channel Pacing Threshold Amplitude: 0.75 V
Lead Channel Pacing Threshold Pulse Width: 0.4 ms
Lead Channel Pacing Threshold Pulse Width: 0.4 ms
Lead Channel Sensing Intrinsic Amplitude: 4.125 mV
Lead Channel Sensing Intrinsic Amplitude: 4.125 mV
Lead Channel Sensing Intrinsic Amplitude: 6.125 mV
Lead Channel Sensing Intrinsic Amplitude: 6.125 mV
Lead Channel Setting Pacing Amplitude: 2 V
Lead Channel Setting Pacing Amplitude: 2.5 V
Lead Channel Setting Pacing Pulse Width: 0.4 ms
Lead Channel Setting Sensing Sensitivity: 0.3 mV

## 2019-08-29 NOTE — Progress Notes (Deleted)
Patient ID: Charles Wyatt, male   DOB: August 25, 1971, 48 y.o.   MRN: [id number]    48 y.o. history of HOCM and PAF. AICD placed due to family history of sudden death. Had other high risk features including septal thickness 21 mm and hyperenhancement on MRI. AICD followed by Dr Caryl Comes. Had one shock in 2014 for rapid afib. Has had multiple recurrences most recent with TEE/DCC 08/20/17 reverted to afib and started on amiodarone with successful repeat [facility] on 08/20/17.  Amiodarone dose tapered to 100 mg daily   Echo 09/07/17  Severe LVH EF 50% Mild MR Moderate to severe LAE  58 mm   Has OSA  ***    ROS: Denies fever, malais, weight loss, blurry vision, decreased visual acuity, cough, sputum, SOB, hemoptysis, pleuritic pain, palpitaitons, heartburn, abdominal pain, melena, lower extremity edema, claudication, or rash.  All other systems reviewed and negative  General: Affect appropriate Overweight white male HEENT: right eye ptosis  Neck supple with no adenopathy JVP normal no bruits no thyromegaly Lungs clear with no wheezing and good diaphragmatic motion Heart:  S1/S2 SEM only with valsalva   , no rub, gallop or click PMI normal AICD under right clavicle  Abdomen: benighn, BS positve, no tenderness, no AAA no bruit.  No HSM or HJR Distal pulses intact with no bruits No edema Neuro non-focal Skin warm and dry No muscular weakness AICD under left clavicle   Current Outpatient Medications  Medication Sig Dispense Refill  . acetaminophen (TYLENOL) 500 MG tablet Take 1,000 mg by mouth every 6 (six) hours as needed for mild pain or headache.    Marland Kitchen amiodarone (PACERONE) 200 MG tablet Take 0.5 tablets (100 mg total) by mouth daily. Please make overdue appt with Dr. Caryl Comes before anymore refills. 3rd and Final Attempt 7 tablet 0  . ELIQUIS 5 MG TABS tablet TAKE 1 TABLET BY MOUTH TWICE A DAY 60 tablet 6  . KLOR-CON M20 20 MEQ tablet TAKE 2 TABLETS (40 MEQ TOTAL) BY MOUTH DAILY. 60 tablet 5  .  metoprolol tartrate (LOPRESSOR) 50 MG tablet Take 1 tablet (50 mg total) by mouth 2 (two) times daily. Please schedule appt for future refills. 2nd attempt 30 tablet 0   No current facility-administered medications for this visit.     Allergies  Patient has no known allergies.  Electrocardiogram:  04/11/13  SR rate 73  Biphasic T waves  Chronic  07/29/15  SR rate 84  Same biphasic T waves especially laterally  Assessment and Plan  HOCM:continue beta blocker non obstructive f/u echo ordered  AICD: Appropriate d/c for VF , AF Rx turned off f/u SK generator change 11/2017 PAF:  Amiodarone tapered to 100 mg daily f/u EP ablation deferred due to LA size and low burden of PAF LA measured 58 mm on echo 2018  OSA: ***  PFT;s and labs ok 04/18/18 on amiodarone   Charles Wyatt

## 2019-08-30 NOTE — Progress Notes (Signed)
Remote ICD transmission.   

## 2019-08-31 ENCOUNTER — Ambulatory Visit: Payer: Managed Care, Other (non HMO) | Admitting: Cardiovascular Disease

## 2019-09-01 ENCOUNTER — Other Ambulatory Visit: Payer: Self-pay | Admitting: Cardiovascular Disease

## 2019-09-01 DIAGNOSIS — I480 Paroxysmal atrial fibrillation: Secondary | ICD-10-CM

## 2019-09-01 MED ORDER — METOPROLOL TARTRATE 50 MG PO TABS: 50.0000 mg | ORAL_TABLET | Freq: Two times a day (BID) | ORAL | 0 refills | Status: DC

## 2019-09-01 MED ORDER — AMIODARONE HCL 200 MG PO TABS: 100.0000 mg | ORAL_TABLET | Freq: Every day | ORAL | 0 refills | Status: DC

## 2019-09-01 NOTE — Telephone Encounter (Signed)
Pt's medications has already been sent to pt's pharmacy as requested. Confirmation received.  

## 2019-09-01 NOTE — Telephone Encounter (Signed)
°*  STAT* If patient is at the pharmacy, call can be transferred to refill team.   1. Which medications need to be refilled? (please list name of each medication and dose if known) amiodarone (PACERONE) 200 MG tablet metoprolol tartrate (LOPRESSOR) 50 MG tablet  2. Which pharmacy/location (including street and city if local pharmacy) is medication to be sent to? [facility], [address] [place] [address]  3. Do they need a 30 day or 90 day supply? 90 days  Patient states he only has 1 pill left.

## 2019-09-01 NOTE — Telephone Encounter (Signed)
Pt calling requesting a refill [facility] to [facility] at [phone], pt is out of town and in need of his medications, pt has an appt in November with Dr. Johnsie Cancel, confirmation received.

## 2019-09-14 ENCOUNTER — Other Ambulatory Visit: Payer: Self-pay | Admitting: Internal Medicine

## 2019-09-14 MED ORDER — APIXABAN 5 MG PO TABS: 5.0000 mg | ORAL_TABLET | Freq: Two times a day (BID) | ORAL | 1 refills | Status: DC

## 2019-09-14 NOTE — Telephone Encounter (Signed)
°*  STAT* If patient is at the pharmacy, call can be transferred to refill team.   1. Which medications need to be refilled? (please list name of each medication and dose if known)  ELIQUIS 5 MG TABS tablet   2. Which pharmacy/location (including street and city if local pharmacy) is medication to be sent to?  [id number] - [place], [place] [address]  3. Do they need a 30 day or 90 day supply? 30  Pt is out of medication

## 2019-09-14 NOTE — Telephone Encounter (Signed)
Pt last saw Dr Caryl Comes 06/25/19, pt is overdue for follow-up has appt scheduled 10/09/19, last labs 06/24/18 Creat 1.46, overdue for labwork as well.  Will place note on upcoming appt pt needs CBC/BMP at [place]. Age 48, weight 111.1kg, based on specified criteria pt is on appropriate dosage of Eliquis 5mg  BID.  Will refill rx to get pt to upcoming appt on 10/09/19 with Dr Johnsie Cancel.

## 2019-09-14 NOTE — Telephone Encounter (Signed)
Pt called office out of medication, rx refill already done today for 30 tablets and 1 refill.  Pt is overdue for follow-up with Dr Caryl Comes and labwork.  Pt has scheduled appt with Dr Johnsie Cancel on 10/09/19, gave refills to get to appt.  Placed note on appt to draw CBC and BMP at [facility].

## 2019-10-03 NOTE — Progress Notes (Signed)
Patient ID: Charles Wyatt, male   DOB: 13-Oct-1971, 48 y.o.   MRN: [id number]     48 y.o. history of HOCM and PAF. AICD placed due to family history of sudden death. Had other high risk features including septal thickness 21 mm and hyperenhancement on MRI. AICD followed by Charles Wyatt.  TEE/DCC 08/20/17 reverted to afib and started on amiodarone with successful repeat [facility] on 08/20/17.  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  All other systems reviewed and negative  General: Affect appropriate Overweight white male HEENT: right eye ptosis  Neck supple with no adenopathy JVP normal no bruits no thyromegaly Lungs clear with no wheezing and good diaphragmatic motion Heart:  S1/S2 SEM only with valsalva   , no rub, gallop or click PMI normal AICD under right clavicle  Abdomen: benighn, BS positve, no tenderness, no AAA no bruit.  No HSM or HJR Distal pulses intact with no bruits No edema Neuro non-focal Skin warm and dry No muscular weakness AICD under left clavicle   Current Outpatient Medications  Medication Sig Dispense Refill  . acetaminophen (TYLENOL) 500 MG tablet Take 1,000 mg by mouth every 6 (six) hours as needed for mild pain or headache.    Marland Kitchen amiodarone (PACERONE) 200 MG tablet Take 0.5 tablets (100 mg total) by mouth daily. Please keep upcoming  appt with Charles Wyatt in November before anymore refills. Final Attempt 45 tablet 0  . apixaban (ELIQUIS) 5 MG TABS tablet Take 1 tablet (5 mg total) by mouth 2 (two) times daily. Pt is overdue for follow-up and lab work, keep scheduled follow-up for future refills. 60 tablet 1  . KLOR-CON M20 20 MEQ tablet TAKE 2 TABLETS (40 MEQ TOTAL) BY MOUTH DAILY. 60 tablet 5  . metoprolol tartrate (LOPRESSOR) 50 MG tablet Take 1 tablet (50 mg total) by mouth 2 (two) times daily. Please keep upcoming appt with Charles Wyatt for November before anymore refills. Final Attempt 180 tablet 0   No current facility-administered medications for this visit.     Allergies  Patient has no known allergies.  Electrocardiogram:  10/09/19 SR q waves 3,F no changes   Assessment and Plan  HOCM:continue beta blocker non osbstructive  AICD: Appropriate d/c for VF , AF Rx turned off f/u Charles Wyatt generator change 10/2017  PAF:  Not ideal to be on amiodarone given young age f/u Charles Wyatt consider ablation given issues with AICD shock for PAF as well  For now continue 100 mg daily will check PFTls/ DLCO TSH and LFTls F/U with Charles Wyatt to discuss d/c amiodarone  OSA: needs f/u study and consult with Charles Wyatt ordered    Charles Wyatt

## 2019-10-09 ENCOUNTER — Ambulatory Visit: Payer: Managed Care, Other (non HMO) | Admitting: Cardiovascular Disease

## 2019-10-09 ENCOUNTER — Telehealth: Payer: Self-pay | Admitting: *Deleted

## 2019-10-09 ENCOUNTER — Encounter: Payer: Self-pay | Admitting: Cardiovascular Disease

## 2019-10-09 ENCOUNTER — Other Ambulatory Visit: Payer: Self-pay

## 2019-10-09 VITALS — BP 118/72 | HR 69 | Ht 67.0 in | Wt 249.0 lb

## 2019-10-09 DIAGNOSIS — I480 Paroxysmal atrial fibrillation: Secondary | ICD-10-CM | POA: Diagnosis not present

## 2019-10-09 DIAGNOSIS — I421 Obstructive hypertrophic cardiomyopathy: Secondary | ICD-10-CM

## 2019-10-09 DIAGNOSIS — Z79899 Other long term (current) drug therapy: Secondary | ICD-10-CM | POA: Diagnosis not present

## 2019-10-09 DIAGNOSIS — G4733 Obstructive sleep apnea (adult) (pediatric): Secondary | ICD-10-CM | POA: Diagnosis not present

## 2019-10-09 NOTE — Telephone Encounter (Signed)
-----   Message from Michaelyn Barter, RN sent at 10/09/2019  8:48 AM EST ----- Allene Pyo,  We ordered sleep study for patient. Patient stated he had one a year ago, or so and did not complete it. He needs to also follow up with Dr. Radford Pax.  Thanks, Pam

## 2019-10-09 NOTE — Patient Instructions (Addendum)
Medication Instructions:   *If you need a refill on your cardiac medications before your next appointment, please call your pharmacy*  Lab Work: Your physician recommends that you have lab work today- TSH and liver panel.   If you have labs (blood work) drawn today and your tests are completely normal, you will receive your results only by: Marland Kitchen MyChart Message (if you have MyChart) OR . A paper copy in the mail If you have any lab test that is abnormal or we need to change your treatment, we will call you to review the results.  Testing/Procedures: Your physician has recommended that you have a pulmonary function test. Pulmonary Function Tests are a group of tests that measure how well air moves in and out of your lungs.  Your physician has recommended that you have a sleep study. This test records several body functions during sleep, including: brain activity, eye movement, oxygen and carbon dioxide blood levels, heart rate and rhythm, breathing rate and rhythm, the flow of air through your mouth and nose, snoring, body muscle movements, and chest and belly movement.  Follow-Up: At [facility], you and your health needs are our priority.  As part of our continuing mission to provide you with exceptional heart care, we have created designated Provider Care Teams.  These Care Teams include your primary Cardiologist (physician) and Advanced Practice Providers (APPs -  Physician Assistants and Nurse Practitioners) who all work together to provide you with the care you need, when you need it.  Your physician recommends that you schedule a follow-up appointment in: 6 to 8 weeks with Dr. Caryl Comes  Your physician recommends that you schedule a follow-up appointment with Dr. Radford Pax.   Your next appointment:   12 months  The format for your next appointment:   In Person  Provider:   You may see [name], MD or one of the following Advanced Practice Providers on your designated Care Team:     Truitt Merle, NP  Cecilie Kicks, NP  Kathyrn Drown, NP

## 2019-10-11 LAB — HEPATIC FUNCTION PANEL
ALT: 21 IU/L (ref 0–44)
AST: 19 IU/L (ref 0–40)
Albumin: 4.8 g/dL (ref 4.0–5.0)
Alkaline Phosphatase: 109 IU/L (ref 39–117)
Bilirubin Total: <0.2 mg/dL (ref 0.0–1.2)
Bilirubin, Direct: 0.07 mg/dL (ref 0.00–0.40)
Total Protein: 7.1 g/dL (ref 6.0–8.5)

## 2019-10-11 LAB — TSH: TSH: 3.82 u[IU]/mL (ref 0.450–4.500)

## 2019-10-13 ENCOUNTER — Telehealth: Payer: Self-pay | Admitting: *Deleted

## 2019-10-13 NOTE — Telephone Encounter (Signed)
PA for in lab sleep study submitted to [facility] via web portal.

## 2019-10-23 ENCOUNTER — Telehealth: Payer: Self-pay | Admitting: *Deleted

## 2019-10-23 NOTE — Telephone Encounter (Signed)
Staff message sent to [name] denied request for in lab sleep study. Provider will need to do peer to peer. Contact # provided.

## 2019-10-31 ENCOUNTER — Telehealth: Payer: Self-pay | Admitting: *Deleted

## 2019-10-31 DIAGNOSIS — G4733 Obstructive sleep apnea (adult) (pediatric): Secondary | ICD-10-CM

## 2019-10-31 NOTE — Telephone Encounter (Signed)
-----   Message from Lauralee Evener, [place] sent at 10/23/2019 11:38 AM EST ----- Regarding: RE: Arna Snipe denied in lab sleep study request. Notify ordering provider call [phone] for peer to peer ----- Message ----- From: Freada Bergeron, [place]: 10/09/2019   1:13 PM EST To: Cv Div Sleep Studies Subject: PRECERT                                         ----- Message ----- From: Michaelyn Barter, RN Sent: 10/09/2019   8:48 AM EST To: Freada Bergeron, CMA  Hello Gae Bon,  We ordered sleep study for patient. Patient stated he had one a year ago, or so and did not complete it. He needs to also follow up with Dr. Radford Pax.  Thanks, Pam

## 2019-10-31 NOTE — Telephone Encounter (Signed)
RE: Cranston Neighbor, CMA  Freada Bergeron, CMA        [employer] denied in lab sleep study request. Notify ordering provider call [phone] for peer to peer

## 2019-10-31 NOTE — Telephone Encounter (Signed)
RE: PRECERT Waddell, Wanda M, CMA  Gryffin Altice G, CMA        [employer] denied in lab sleep study request. Notify ordering provider call [phone] for peer to peer    

## 2019-11-07 NOTE — Telephone Encounter (Signed)
Dr Radford Pax has written for auto cpap from 4-20 cm H20 with heated humidity and mask of choice, get d/l in 2 weeks.  Order has been placed to [facility]

## 2019-11-14 ENCOUNTER — Other Ambulatory Visit: Payer: Self-pay | Admitting: Cardiovascular Disease

## 2019-11-14 DIAGNOSIS — I480 Paroxysmal atrial fibrillation: Secondary | ICD-10-CM

## 2019-11-14 MED ORDER — APIXABAN 5 MG PO TABS: 5.0000 mg | ORAL_TABLET | Freq: Two times a day (BID) | ORAL | 0 refills | Status: DC

## 2019-11-14 NOTE — Telephone Encounter (Signed)
Eliquis 5mg  paper refill request received. Pt is 48 yrs old, weight-112.9kg, Crea-1.46 on 06/24/2018-needs labs, Diagnosis-Afib, and last seen by Dr. Johnsie Cancel on 10/09/2019. Dose is appropriate based on dosing criteria.  Pt needs updated labs, so called the pt and he scheduled a lab appt for 11/29/2019 at 815am. He is aware a refill will be sent at this time for a 30 day supply only.

## 2019-11-21 ENCOUNTER — Ambulatory Visit (INDEPENDENT_AMBULATORY_CARE_PROVIDER_SITE_OTHER): Payer: Managed Care, Other (non HMO) | Admitting: *Deleted

## 2019-11-21 DIAGNOSIS — I4891 Unspecified atrial fibrillation: Secondary | ICD-10-CM | POA: Diagnosis not present

## 2019-11-21 LAB — CUP PACEART REMOTE DEVICE CHECK
Battery Remaining Longevity: 114 mo
Battery Voltage: 2.95 V
Brady Statistic AP VP Percent: 4.17 %
Brady Statistic AP VS Percent: 1.81 %
Brady Statistic AS VP Percent: 0.03 %
Brady Statistic AS VS Percent: 94 %
Brady Statistic RA Percent Paced: 5.97 %
Brady Statistic RV Percent Paced: 4.2 %
Date Time Interrogation Session: 20201222022824
HighPow Impedance: 54 Ohm
HighPow Impedance: 74 Ohm
Implantable Lead Implant Date: 20110921
Implantable Lead Implant Date: 20110921
Implantable Lead Location: 753859
Implantable Lead Location: 753860
Implantable Lead Model: 5076
Implantable Lead Model: 6947
Implantable Pulse Generator Implant Date: 20181231
Lead Channel Impedance Value: 304 Ohm
Lead Channel Impedance Value: 361 Ohm
Lead Channel Impedance Value: 475 Ohm
Lead Channel Pacing Threshold Amplitude: 0.625 V
Lead Channel Pacing Threshold Amplitude: 0.875 V
Lead Channel Pacing Threshold Pulse Width: 0.4 ms
Lead Channel Pacing Threshold Pulse Width: 0.4 ms
Lead Channel Sensing Intrinsic Amplitude: 4.375 mV
Lead Channel Sensing Intrinsic Amplitude: 4.375 mV
Lead Channel Sensing Intrinsic Amplitude: 6 mV
Lead Channel Sensing Intrinsic Amplitude: 6 mV
Lead Channel Setting Pacing Amplitude: 2 V
Lead Channel Setting Pacing Amplitude: 2.5 V
Lead Channel Setting Pacing Pulse Width: 0.4 ms
Lead Channel Setting Sensing Sensitivity: 0.3 mV

## 2019-11-29 ENCOUNTER — Other Ambulatory Visit: Payer: Managed Care, Other (non HMO) | Admitting: *Deleted

## 2019-11-29 ENCOUNTER — Other Ambulatory Visit: Payer: Self-pay

## 2019-11-29 DIAGNOSIS — I480 Paroxysmal atrial fibrillation: Secondary | ICD-10-CM

## 2019-11-29 LAB — BASIC METABOLIC PANEL
BUN/Creatinine Ratio: 12 (ref 9–20)
BUN: 18 mg/dL (ref 6–24)
CO2: 23 mmol/L (ref 20–29)
Calcium: 9.8 mg/dL (ref 8.7–10.2)
Chloride: 102 mmol/L (ref 96–106)
Creatinine, Ser: 1.52 mg/dL — ABNORMAL HIGH (ref 0.76–1.27)
GFR calc Af Amer: 62 mL/min/{1.73_m2} (ref >59)
GFR calc non Af Amer: 54 mL/min/{1.73_m2} — ABNORMAL LOW (ref >59)
Glucose: 143 mg/dL — ABNORMAL HIGH (ref 65–99)
Potassium: 4.2 mmol/L (ref 3.5–5.2)
Sodium: 140 mmol/L (ref 134–144)

## 2019-12-05 ENCOUNTER — Other Ambulatory Visit (HOSPITAL_COMMUNITY)
Admission: RE | Admit: 2019-12-05 | Discharge: 2019-12-05 | Disposition: A | Discharge status: Home / Self Care | Payer: Managed Care, Other (non HMO) | Source: Ambulatory Visit | Attending: Cardiovascular Disease | Admitting: Cardiovascular Disease

## 2019-12-05 DIAGNOSIS — Z20822 Contact with and (suspected) exposure to covid-19: Secondary | ICD-10-CM | POA: Diagnosis not present

## 2019-12-05 DIAGNOSIS — Z01812 Encounter for preprocedural laboratory examination: Secondary | ICD-10-CM | POA: Insufficient documentation

## 2019-12-06 ENCOUNTER — Other Ambulatory Visit: Payer: Self-pay | Admitting: Cardiovascular Disease

## 2019-12-06 NOTE — Telephone Encounter (Signed)
Prescription refill request for Eliquis received.  Last office visit: Nishan, 10/09/2019 Scr: 1.52, 11/29/2019 Age:  49 y.o. Weight: 112.9 kg  Prescription refill sent.

## 2019-12-07 LAB — NOVEL CORONAVIRUS, NAA (HOSP ORDER, SEND-OUT TO REF LAB; TAT 18-24 HRS): SARS-CoV-2, NAA: NOT DETECTED

## 2019-12-11 ENCOUNTER — Encounter: Payer: Self-pay | Admitting: Internal Medicine

## 2019-12-11 ENCOUNTER — Ambulatory Visit (INDEPENDENT_AMBULATORY_CARE_PROVIDER_SITE_OTHER): Payer: Managed Care, Other (non HMO) | Admitting: Internal Medicine

## 2019-12-11 ENCOUNTER — Other Ambulatory Visit: Payer: Self-pay

## 2019-12-11 VITALS — BP 126/80 | HR 66 | Ht 67.0 in | Wt 243.6 lb

## 2019-12-11 DIAGNOSIS — I422 Other hypertrophic cardiomyopathy: Secondary | ICD-10-CM | POA: Diagnosis not present

## 2019-12-11 DIAGNOSIS — Z79899 Other long term (current) drug therapy: Secondary | ICD-10-CM

## 2019-12-11 DIAGNOSIS — I480 Paroxysmal atrial fibrillation: Secondary | ICD-10-CM

## 2019-12-11 NOTE — Progress Notes (Signed)
Patient Care Team: Sharlene Dory, DO as PCP - General (Family Medicine) Wendall Stade, MD as PCP - Cardiology (Cardiology) Duke Salvia, MD as Consulting Physician (Cardiology)   HPI  Charles Wyatt is a 49 y.o. male seen in followup for an ICD  The device originally implanted for HCM in the setting of a family history of sudden death and significant septal thickness and hyperenhancing on cardiac MRI. He has hx of inappropriate shock from AFib RVR. On apixoban  He underwent ICD generator replacement 12/18.   He was seen in the emergency room 11/05/16 following 3 ICD shocks. They were recorded by the fellow as appropriate. He declined admission  Review of these data however demonstrates that he had nonsustained ventricular tachycardia and shocks were delivered because of commitment following charging on shock 1 and with recurrent nonsustained ventricular tachycardia, subsequent shocks. This occurred in the context of having taken a new weight loss stimulants. He was also not taking his beta blocker.  His family history was reviewed. It seems that the gene runs through his mother family and a large kindred has been identified and is being followed in [place] and [place]   He was admitted 9/18 w recurrent AFib with RVR; he was started on amiodarone.  He saw Dr. Fawn Kirk to consider ablation; his low atrial fibrillation burden and left atrial size it was elected at that time to continue medical therapy  He remains on amiodarone.  Tolerated.  Without cough or nausea.  Some days are "off "like he is nervous.  No syncope chest pain.  Mild chronic dyspnea.    Date Cr K TSH LFT Hgb  9/18   4.04     1/19  1.4 4.3 5.29 22 14.8   3/19   4.03    12/20 1.52 4.2 3.82 21    DATE TEST    9/17 Echo    EF 50-55 %   10/18 Echo    EF 50 % LVH/ LAE (5.8/2.6/67)  MR mile            Past Medical History:  Diagnosis Date  . Biatrial enlargement   . HCM    06 19/2011 ?[facility] ?Tee to  R/o subaortic membrane   . Implantable cardiac defibrillator Medtronic   . 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 Persistent atrial fibrillation (HCC)    Inappro shock  . Snoring     Past Surgical History:  Procedure Laterality Date  . CARDIOVERSION N/A 08/20/2017   Procedure: CARDIOVERSION;  Surgeon: Quintella Reichert, MD;  Location: [facility] ENDOSCOPY;  Service: Cardiovascular;  Laterality: N/A;  . CARDIOVERSION N/A 08/24/2017   Procedure: CARDIOVERSION;  Surgeon: Jake Bathe, MD;  Location: [facility] ENDOSCOPY;  Service: Cardiovascular;  Laterality: N/A;  . Defibrillator implantation     Medtronic Protecta 314 DRG ICD implanted by [name] for primary prevention  . PPM GENERATOR CHANGEOUT N/A 11/29/2017   EVERA MRI XT DR ICD implanted by [name] for primary prevention  . TEE WITHOUT CARDIOVERSION N/A 08/20/2017   Procedure: TRANSESOPHAGEAL ECHOCARDIOGRAM (TEE);  Surgeon: Quintella Reichert, MD;  Location: [facility] ENDOSCOPY;  Service: Cardiovascular;  Laterality: N/A;    Current Outpatient Medications  Medication Sig Dispense Refill  . acetaminophen (TYLENOL) 500 MG tablet Take 1,000 mg by mouth every 6 (six) hours as needed for mild pain or headache.    Marland Kitchen amiodarone (PACERONE) 200 MG tablet TAKE 1/2 TABLET (100 MG TOTAL) BY MOUTH DAILY. PLEASE  KEEP UPCOMING APPT WITH DR. [place] ANYMORE REFILLS. FINAL ATTEMPT 45 tablet 3  . ELIQUIS 5 MG TABS tablet TAKE 1 TABLET BY MOUTH 2 (TWO) TIMES DAILY. PLEASE KEEP LAB APPT FOR FURTHER REFILLS. THANKS 60 tablet 5  . metoprolol tartrate (LOPRESSOR) 50 MG tablet TAKE 1 TABLET (50 MG TOTAL) BY MOUTH 2 (TWO) TIMES DAILY. PLEASE KEEP UPCOMING APPT WITH DR. Johnsie Cancel FOR NOVEMBER BEFORE ANYMORE REFILLS. FINAL ATTEMPT 180 tablet 3  . KLOR-CON M20 20 MEQ tablet TAKE 2 TABLETS (40 MEQ TOTAL) BY MOUTH DAILY. (Patient not taking: Reported on 12/11/2019) 60 tablet 5   No current facility-administered medications for this visit.    No Known  Allergies    Review of Systems negative except from HPI and PMH  Physical Exam BP 126/80   Pulse 66   Ht 5\' 7"  (1.702 m)   Wt 243 lb 9.6 oz (110.5 kg)   SpO2 97%   BMI 38.15 kg/m   Well developed and well nourished in no acute distress HENT normal Neck supple with JVP-flat Clear Device pocket well healed; without hematoma or erythema.  There is no tethering  Regular rate and rhythm, nO murmur Abd-soft with active BS No Clubbing cyanosis   edema Skin-warm and dry A & Oriented  Grossly normal sensory and motor function  ECG sinus at 66 Interval 20/11/40 No specific ST-T changes Low voltage    Assessment and  Plan HCM  ICD Medtronic   Ventricular tachycardia-nonsustained  Obesity  Atrial fibrillation with a rapid ventricular response  High Risk Medication Surveillance Amio  Low Voltage    Tolerating amiodarone.  No interval atrial fibrillation more than couple of minutes; no nonsustained ventricular tachycardia  I am struck by the low voltage on his ECG and raises the issue as to whether this could be a phenocopy.  It could also be related to his size.  We will plan to recheck as he loses weight.

## 2019-12-11 NOTE — Patient Instructions (Signed)
Medication Instructions:  Your physician recommends that you continue on your current medications as directed. Please refer to the Current Medication list given to you today.  Labwork: None ordered.  Testing/Procedures: None ordered.  Follow-Up: Your physician wants you to follow-up in: 6 months with Dr Klein. You will receive a reminder letter in the mail two months in advance. If you don't receive a letter, please call our office to schedule the follow-up appointment.  Remote monitoring is used to monitor your Pacemaker of ICD from home. This monitoring reduces the number of office visits required to check your device to one time per year. It allows us to keep an eye on the functioning of your device to ensure it is working properly.Any Other Special Instructions Will Be Listed Below (If Applicable).  If you need a refill on your cardiac medications before your next appointment, please call your pharmacy.   

## 2019-12-12 ENCOUNTER — Ambulatory Visit (INDEPENDENT_AMBULATORY_CARE_PROVIDER_SITE_OTHER): Payer: Managed Care, Other (non HMO) | Admitting: Internal Medicine

## 2019-12-12 DIAGNOSIS — I480 Paroxysmal atrial fibrillation: Secondary | ICD-10-CM

## 2019-12-12 DIAGNOSIS — I421 Obstructive hypertrophic cardiomyopathy: Secondary | ICD-10-CM

## 2019-12-12 DIAGNOSIS — Z79899 Other long term (current) drug therapy: Secondary | ICD-10-CM

## 2019-12-12 LAB — PULMONARY FUNCTION TEST
DL/VA % pred: 116 %
DL/VA: 5.3 ml/min/mmHg/L
DLCO unc % pred: 95 %
DLCO unc: 25.99 ml/min/mmHg
FEF 25-75 Post: 1.91 L/sec
FEF 25-75 Pre: 1.94 L/sec
FEF2575-%Change-Post: -1 %
FEF2575-%Pred-Post: 57 %
FEF2575-%Pred-Pre: 57 %
FEV1-%Change-Post: 3 %
FEV1-%Pred-Post: 72 %
FEV1-%Pred-Pre: 69 %
FEV1-Post: 2.64 L
FEV1-Pre: 2.55 L
FEV1FVC-%Change-Post: -1 %
FEV1FVC-%Pred-Pre: 93 %
FEV6-%Change-Post: 4 %
FEV6-%Pred-Post: 80 %
FEV6-%Pred-Pre: 77 %
FEV6-Post: 3.63 L
FEV6-Pre: 3.47 L
FEV6FVC-%Change-Post: 0 %
FEV6FVC-%Pred-Post: 103 %
FEV6FVC-%Pred-Pre: 103 %
FVC-%Change-Post: 5 %
FVC-%Pred-Post: 78 %
FVC-%Pred-Pre: 74 %
FVC-Post: 3.64 L
FVC-Pre: 3.47 L
Post FEV1/FVC ratio: 73 %
Post FEV6/FVC ratio: 100 %
Pre FEV1/FVC ratio: 74 %
Pre FEV6/FVC Ratio: 100 %
RV % pred: 108 %
RV: 1.96 L
TLC % pred: 81 %
TLC: 5.19 L

## 2019-12-12 NOTE — Progress Notes (Signed)
PFT done today. 

## 2019-12-14 LAB — CUP PACEART INCLINIC DEVICE CHECK
Date Time Interrogation Session: 20210111115549
Implantable Lead Implant Date: 20110921
Implantable Lead Implant Date: 20110921
Implantable Lead Location: 753859
Implantable Lead Location: 753860
Implantable Lead Model: 5076
Implantable Lead Model: 6947
Implantable Pulse Generator Implant Date: 20181231

## 2020-02-20 ENCOUNTER — Ambulatory Visit (INDEPENDENT_AMBULATORY_CARE_PROVIDER_SITE_OTHER): Payer: Managed Care, Other (non HMO) | Admitting: *Deleted

## 2020-02-20 DIAGNOSIS — I4891 Unspecified atrial fibrillation: Secondary | ICD-10-CM | POA: Diagnosis not present

## 2020-02-20 LAB — CUP PACEART REMOTE DEVICE CHECK
Battery Remaining Longevity: 111 mo
Battery Voltage: 2.94 V
Brady Statistic AP VP Percent: 7.37 %
Brady Statistic AP VS Percent: 2.22 %
Brady Statistic AS VP Percent: 0.03 %
Brady Statistic AS VS Percent: 90.37 %
Brady Statistic RA Percent Paced: 9.59 %
Brady Statistic RV Percent Paced: 7.39 %
Date Time Interrogation Session: 20210323044225
HighPow Impedance: 49 Ohm
HighPow Impedance: 61 Ohm
Implantable Lead Implant Date: 20110921
Implantable Lead Implant Date: 20110921
Implantable Lead Location: 753859
Implantable Lead Location: 753860
Implantable Lead Model: 5076
Implantable Lead Model: 6947
Implantable Pulse Generator Implant Date: 20181231
Lead Channel Impedance Value: 304 Ohm
Lead Channel Impedance Value: 342 Ohm
Lead Channel Impedance Value: 456 Ohm
Lead Channel Pacing Threshold Amplitude: 0.625 V
Lead Channel Pacing Threshold Amplitude: 0.75 V
Lead Channel Pacing Threshold Pulse Width: 0.4 ms
Lead Channel Pacing Threshold Pulse Width: 0.4 ms
Lead Channel Sensing Intrinsic Amplitude: 3.625 mV
Lead Channel Sensing Intrinsic Amplitude: 3.625 mV
Lead Channel Sensing Intrinsic Amplitude: 5 mV
Lead Channel Sensing Intrinsic Amplitude: 5 mV
Lead Channel Setting Pacing Amplitude: 2 V
Lead Channel Setting Pacing Amplitude: 2.5 V
Lead Channel Setting Pacing Pulse Width: 0.4 ms
Lead Channel Setting Sensing Sensitivity: 0.3 mV

## 2020-02-21 NOTE — Progress Notes (Signed)
ICD Remote  

## 2020-05-21 ENCOUNTER — Ambulatory Visit (INDEPENDENT_AMBULATORY_CARE_PROVIDER_SITE_OTHER): Payer: Managed Care, Other (non HMO) | Admitting: *Deleted

## 2020-05-21 DIAGNOSIS — I4891 Unspecified atrial fibrillation: Secondary | ICD-10-CM | POA: Diagnosis not present

## 2020-05-21 LAB — CUP PACEART REMOTE DEVICE CHECK
Battery Remaining Longevity: 108 mo
Battery Voltage: 2.94 V
Brady Statistic AP VP Percent: 6.73 %
Brady Statistic AP VS Percent: 2.17 %
Brady Statistic AS VP Percent: 0.03 %
Brady Statistic AS VS Percent: 91.07 %
Brady Statistic RA Percent Paced: 8.89 %
Brady Statistic RV Percent Paced: 6.72 %
Date Time Interrogation Session: 20210622043725
HighPow Impedance: 48 Ohm
HighPow Impedance: 63 Ohm
Implantable Lead Implant Date: 20110921
Implantable Lead Implant Date: 20110921
Implantable Lead Location: 753859
Implantable Lead Location: 753860
Implantable Lead Model: 5076
Implantable Lead Model: 6947
Implantable Pulse Generator Implant Date: 20181231
Lead Channel Impedance Value: 304 Ohm
Lead Channel Impedance Value: 361 Ohm
Lead Channel Impedance Value: 456 Ohm
Lead Channel Pacing Threshold Amplitude: 0.625 V
Lead Channel Pacing Threshold Amplitude: 0.875 V
Lead Channel Pacing Threshold Pulse Width: 0.4 ms
Lead Channel Pacing Threshold Pulse Width: 0.4 ms
Lead Channel Sensing Intrinsic Amplitude: 3.25 mV
Lead Channel Sensing Intrinsic Amplitude: 3.25 mV
Lead Channel Sensing Intrinsic Amplitude: 5.5 mV
Lead Channel Sensing Intrinsic Amplitude: 5.5 mV
Lead Channel Setting Pacing Amplitude: 2 V
Lead Channel Setting Pacing Amplitude: 2.5 V
Lead Channel Setting Pacing Pulse Width: 0.4 ms
Lead Channel Setting Sensing Sensitivity: 0.3 mV

## 2020-05-23 NOTE — Progress Notes (Signed)
Remote ICD transmission.   

## 2020-06-06 ENCOUNTER — Other Ambulatory Visit: Payer: Self-pay | Admitting: Cardiovascular Disease

## 2020-06-06 NOTE — Telephone Encounter (Signed)
Eliquis 5mg  refill request received. Patient is 49 years old, weight-110.5kg, Crea-1.52 on 11/29/2019, Diagnosis-Afib, and last seen by Dr. 12/01/2019 on 12/11/2019. Dose is appropriate based on dosing criteria. Will send in refill to requested pharmacy.

## 2020-08-20 ENCOUNTER — Ambulatory Visit (INDEPENDENT_AMBULATORY_CARE_PROVIDER_SITE_OTHER): Payer: Managed Care, Other (non HMO) | Admitting: *Deleted

## 2020-08-20 DIAGNOSIS — I421 Obstructive hypertrophic cardiomyopathy: Secondary | ICD-10-CM

## 2020-08-20 LAB — CUP PACEART REMOTE DEVICE CHECK
Battery Remaining Longevity: 104 mo
Battery Voltage: 2.93 V
Brady Statistic AP VP Percent: 3.77 %
Brady Statistic AP VS Percent: 1.66 %
Brady Statistic AS VP Percent: 0.04 %
Brady Statistic AS VS Percent: 94.54 %
Brady Statistic RA Percent Paced: 5.42 %
Brady Statistic RV Percent Paced: 3.8 %
Date Time Interrogation Session: 20210921043825
HighPow Impedance: 53 Ohm
HighPow Impedance: 66 Ohm
Implantable Lead Implant Date: 20110921
Implantable Lead Implant Date: 20110921
Implantable Lead Location: 753859
Implantable Lead Location: 753860
Implantable Lead Model: 5076
Implantable Lead Model: 6947
Implantable Pulse Generator Implant Date: 20181231
Lead Channel Impedance Value: 285 Ohm
Lead Channel Impedance Value: 342 Ohm
Lead Channel Impedance Value: 456 Ohm
Lead Channel Pacing Threshold Amplitude: 0.625 V
Lead Channel Pacing Threshold Amplitude: 0.875 V
Lead Channel Pacing Threshold Pulse Width: 0.4 ms
Lead Channel Pacing Threshold Pulse Width: 0.4 ms
Lead Channel Sensing Intrinsic Amplitude: 3.125 mV
Lead Channel Sensing Intrinsic Amplitude: 3.125 mV
Lead Channel Sensing Intrinsic Amplitude: 5 mV
Lead Channel Sensing Intrinsic Amplitude: 5 mV
Lead Channel Setting Pacing Amplitude: 2 V
Lead Channel Setting Pacing Amplitude: 2.5 V
Lead Channel Setting Pacing Pulse Width: 0.4 ms
Lead Channel Setting Sensing Sensitivity: 0.3 mV

## 2020-08-23 NOTE — Progress Notes (Signed)
Remote ICD transmission.   

## 2020-10-05 ENCOUNTER — Other Ambulatory Visit: Payer: Managed Care, Other (non HMO)

## 2020-10-05 DIAGNOSIS — Z20822 Contact with and (suspected) exposure to covid-19: Secondary | ICD-10-CM

## 2020-10-06 LAB — SARS-COV-2, NAA 2 DAY TAT

## 2020-10-06 LAB — NOVEL CORONAVIRUS, NAA: SARS-CoV-2, NAA: DETECTED — AB

## 2020-10-07 ENCOUNTER — Ambulatory Visit (HOSPITAL_COMMUNITY)
Admission: RE | Admit: 2020-10-07 | Discharge: 2020-10-07 | Disposition: A | Discharge status: Home / Self Care | Payer: Managed Care, Other (non HMO) | Source: Ambulatory Visit | Attending: Pulmonary Disease | Admitting: Pulmonary Disease

## 2020-10-07 ENCOUNTER — Telehealth (INDEPENDENT_AMBULATORY_CARE_PROVIDER_SITE_OTHER): Payer: Managed Care, Other (non HMO) | Admitting: Family Medicine

## 2020-10-07 ENCOUNTER — Other Ambulatory Visit: Payer: Self-pay | Admitting: Nurse Practitioner

## 2020-10-07 ENCOUNTER — Telehealth: Payer: Self-pay | Admitting: Internal Medicine

## 2020-10-07 ENCOUNTER — Other Ambulatory Visit: Payer: Self-pay

## 2020-10-07 DIAGNOSIS — U071 COVID-19: Secondary | ICD-10-CM | POA: Insufficient documentation

## 2020-10-07 DIAGNOSIS — Z5329 Procedure and treatment not carried out because of patient's decision for other reasons: Secondary | ICD-10-CM

## 2020-10-07 MED ORDER — ALBUTEROL SULFATE HFA 108 (90 BASE) MCG/ACT IN AERS: 2.0000 | INHALATION_SPRAY | Freq: Once | RESPIRATORY_TRACT | Status: DC | PRN

## 2020-10-07 MED ORDER — SODIUM CHLORIDE 0.9 % IV SOLN: INTRAVENOUS | Status: DC | PRN

## 2020-10-07 MED ORDER — EPINEPHRINE 0.3 MG/0.3ML IJ SOAJ: 0.3000 mg | Freq: Once | INTRAMUSCULAR | Status: DC | PRN

## 2020-10-07 MED ORDER — FAMOTIDINE IN NACL 20-0.9 MG/50ML-% IV SOLN: 20.0000 mg | Freq: Once | INTRAVENOUS | Status: DC | PRN

## 2020-10-07 MED ORDER — METHYLPREDNISOLONE SODIUM SUCC 125 MG IJ SOLR: 125.0000 mg | Freq: Once | INTRAMUSCULAR | Status: DC | PRN

## 2020-10-07 MED ORDER — DIPHENHYDRAMINE HCL 50 MG/ML IJ SOLN: 50.0000 mg | Freq: Once | INTRAMUSCULAR | Status: DC | PRN

## 2020-10-07 MED ORDER — SOTROVIMAB 500 MG/8ML IV SOLN
500.0000 mg | Freq: Once | INTRAVENOUS | Status: AC
  Administered 2020-10-07: 500 mg via INTRAVENOUS

## 2020-10-07 NOTE — Discharge Instructions (Signed)

## 2020-10-07 NOTE — Telephone Encounter (Signed)
Pt tested positive for COVID using a home test on Friday afternoon and got positive results from a test at [facility] Saturday afternoon. The patient wanted to know if thee is anything he needs to do.  Pt has only had some sinus pressure and a low grade fever (not over 99). Please advise

## 2020-10-07 NOTE — Progress Notes (Signed)
  Diagnosis: COVID-19  Physician: Dr. Delford Field   Procedure:  Sotrovimab infusion- Provided patient with the Sotrovimab infusion fact sheet for patients, parents, and caregivers prior to infusion.   Complications: No immediate complications noted.  Discharge: Discharged home   Charles Wyatt 10/07/2020

## 2020-10-07 NOTE — Progress Notes (Signed)
I connected by phone with Charles Wyatt on 10/07/2020 at 9:37 AM to discuss the potential use of a treatment for mild to moderate COVID-19 viral infection in non-hospitalized patients.  This patient is a 49 y.o. male that meets the FDA criteria for Emergency Use Authorization of bamlanivimab/etesevimab, casirivimab\imdevimab, or sotrovimab  Has a (+) direct SARS-CoV-2 viral test result  Has mild or moderate COVID-19   Is ? 48 years of age and weighs ? 40 kg  Is NOT hospitalized due to COVID-19  Is NOT requiring oxygen therapy or requiring an increase in baseline oxygen flow rate due to COVID-19  Is within 10 days of symptom onset  Has at least one of the high risk factor(s) for progression to severe COVID-19 and/or hospitalization as defined in EUA.  Specific high risk criteria : BMI > 25 and Cardiovascular disease or hypertension   I have spoken and communicated the following to the patient or parent/caregiver:  1. FDA has authorized the emergency use of bamlanivimab/etesevimab, casirivimab\imdevimab, or sotrovimab for the treatment of mild to moderate COVID-19 in adults and pediatric patients with positive results of direct SARS-CoV-2 viral testing who are 19 years of age and older weighing at least 40 kg, and who are at high risk for progressing to severe COVID-19 and/or hospitalization.  2. The significant known and potential risks and benefits of bamlanivimab/etesevimab, casirivimab\imdevimab, or sotrovimab, and the extent to which such potential risks and benefits are unknown.  3. Information on available alternative treatments and the risks and benefits of those alternatives, including clinical trials.  4. Patients treated with bamlanivimab/etesevimab, casirivimab\imdevimab, or sotrovimab should continue to self-isolate and use infection control measures (e.g., wear mask, isolate, social distance, avoid sharing personal items, clean and disinfect "high touch" surfaces, and  frequent handwashing) according to CDC guidelines.   5. The patient or parent/caregiver has the option to accept or refuse bamlanivimab/etesevimab, casirivimab\imdevimab, or sotrovimab.  After reviewing this information with the patient, the patient has agreed to receive one of the available covid 19 monoclonal antibodies and will be provided an appropriate fact sheet prior to infusion.[name], DNP, AGNP-C [phone] (Infusion Center Hotline)

## 2020-10-07 NOTE — Progress Notes (Signed)
Attempts to reach the pt were not successful. He will be marked as a "no show".

## 2020-10-07 NOTE — Telephone Encounter (Signed)
This has been addressed by Dr Eden Emms and pt will be receiving Antibody infusion today.

## 2020-10-08 ENCOUNTER — Other Ambulatory Visit: Payer: Self-pay

## 2020-10-08 ENCOUNTER — Encounter: Payer: Self-pay | Admitting: Family Medicine

## 2020-10-08 ENCOUNTER — Telehealth (INDEPENDENT_AMBULATORY_CARE_PROVIDER_SITE_OTHER): Payer: Managed Care, Other (non HMO) | Admitting: Family Medicine

## 2020-10-08 VITALS — Ht 67.0 in | Wt 255.0 lb

## 2020-10-08 DIAGNOSIS — U071 COVID-19: Secondary | ICD-10-CM | POA: Diagnosis not present

## 2020-10-08 MED ORDER — METHYLPREDNISOLONE 4 MG PO TBPK: ORAL_TABLET | ORAL | 0 refills | Status: DC

## 2020-10-08 NOTE — Progress Notes (Signed)
Chief Complaint  Patient presents with   Covid Positive    sinus pressure    Charles Wyatt here for URI complaints. Due to COVID-19 pandemic, we are interacting via web portal for an electronic face-to-face visit. I verified patient's ID using 2 identifiers. Patient agreed to proceed with visit via this method. Patient is at home, I am at office. Patient and I are present for visit.   Duration: 5 days  Associated symptoms: sinus congestion and low grade fevers, slight cough, sinus pressure, diarrhea Denies: sinus pain, itchy watery eyes, ear pain, ear drainage, sore throat, wheezing, shortness of breath, myalgia, abd pain and N/V Treatment to date: infusion yesterday, Dayquil, Airborne, Tylenol Sick contacts: No  He tested positive with home tests and PCR result yesterday is neg.  Past Medical History:  Diagnosis Date   Biatrial enlargement    HCM    06 19/2011 ?[facility] ?Tee to R/o subaortic membrane    Implantable cardiac defibrillator Medtronic    Inappropriate shocks from ICD --AFib    Persistent atrial fibrillation (HCC)    Inappro shock   Snoring     Ht 5\' 7"  (1.702 m)    Wt 255 lb (115.7 kg)    BMI 39.94 kg/m  No conversational dyspnea Age appropriate judgment and insight Nml affect and mood  COVID-19 - Plan: methylPREDNISolone (MEDROL DOSEPAK) 4 MG TBPK tablet  S/P monoclonal ab infusion. Doing well currently. Warnings verbalized.  Continue to push fluids, practice good hand hygiene, cover mouth when coughing. F/u prn. If starting to exp irreplaceable fluid loss or shortness of breath, seek immediate care. Pt voiced understanding and agreement to the plan.  [place], DO 10/08/20 8:00 AM

## 2020-10-28 ENCOUNTER — Encounter: Payer: Self-pay | Admitting: Cardiovascular Disease

## 2020-10-28 ENCOUNTER — Telehealth (INDEPENDENT_AMBULATORY_CARE_PROVIDER_SITE_OTHER): Payer: Managed Care, Other (non HMO) | Admitting: Cardiovascular Disease

## 2020-10-28 VITALS — BP 136/90 | HR 78 | Ht 67.0 in | Wt 250.0 lb

## 2020-10-28 DIAGNOSIS — I480 Paroxysmal atrial fibrillation: Secondary | ICD-10-CM

## 2020-10-28 DIAGNOSIS — I422 Other hypertrophic cardiomyopathy: Secondary | ICD-10-CM | POA: Diagnosis not present

## 2020-10-28 DIAGNOSIS — Z95810 Presence of automatic (implantable) cardiac defibrillator: Secondary | ICD-10-CM | POA: Diagnosis not present

## 2020-10-28 NOTE — Addendum Note (Signed)
Addended by: Dossie Arbour on: 10/28/2020 08:41 AM   Modules accepted: Orders

## 2020-10-28 NOTE — Patient Instructions (Addendum)
Medication Instructions:  Your physician has recommended you make the following change in your medication: Stop amiodarone  *If you need a refill on your cardiac medications before your next appointment, please call your pharmacy*   Lab Work: none If you have labs (blood work) drawn today and your tests are completely normal, you will receive your results only by:  MyChart Message (if you have MyChart) OR  A paper copy in the mail If you have any lab test that is abnormal or we need to change your treatment, we will call you to review the results.   Testing/Procedures: none   Follow-Up: At [facility], you and your health needs are our priority.  As part of our continuing mission to provide you with exceptional heart care, we have created designated Provider Care Teams.  These Care Teams include your primary Cardiologist (physician) and Advanced Practice Providers (APPs -  Physician Assistants and Nurse Practitioners) who all work together to provide you with the care you need, when you need it.  We recommend signing up for the patient portal called "MyChart".  Sign up information is provided on this After Visit Summary.  MyChart is used to connect with patients for Virtual Visits (Telemedicine).  Patients are able to view lab/test results, encounter notes, upcoming appointments, etc.  Non-urgent messages can be sent to your provider as well.   To learn more about what you can do with MyChart, go to [email or web address].    Your next appointment:   3 month(s)  The format for your next appointment:   In Person  Provider:   [name]   Other Instructions Follow up in 6 months with Dr Rudene Anda person

## 2020-10-28 NOTE — Progress Notes (Signed)
Patient ID: Charles Wyatt, male   DOB: 1971-02-26, 49 y.o.   MRN: [id number]      Virtual Visit via Video Note   This visit type was conducted due to national recommendations for restrictions regarding the COVID-19 Pandemic (e.g. social distancing) in an effort to limit this patient's exposure and mitigate transmission in our community.  Due to her co-morbid illnesses, this patient is at least at moderate risk for complications without adequate follow up.  This format is felt to be most appropriate for this patient at this time.  All issues noted in this document were discussed and addressed.  A limited physical exam was performed with this format.  Please refer to the patient's chart for her consent to telehealth for [facility].   Physician Location: Office Patient Location : Home   49 y.o. history of HOCM and PAF. AICD medtronic due to family history of sudden death, septal thickness 21 mm and hyper-enhancement on MRI . On amiodarone long term due to multiple shocks. And [facility] 08/20/17 Seen by JA in 2018 and not offered ablation   Echo 09/07/17  Severe LVH EF 50% Mild MR Moderate to severe LAE  58 mm   Compliant with eliquis  DLCO normal  In January 2021 LFTls/T4 normal 10/2019   Using CPAP auto-regulated   Had COVID November 2021. Received infusion Mostly headache and sinus Pressure Home test positive   Discussed stopping amiodarone due to long term side effects   Discussed getting vaccine in 6 months when natural immunity may wane He normally does not get flu shot either    ROS: Denies fever, malais, weight loss, blurry vision, decreased visual acuity, cough, sputum, SOB, hemoptysis, pleuritic pain, palpitaitons, heartburn, abdominal pain, melena, lower extremity edema, claudication, or rash.  All other systems reviewed and negative  General   AICD under left clavicle No distress No tachypnea No edema No JVP elevation   Current Outpatient Medications  Medication Sig  Dispense Refill  . acetaminophen (TYLENOL) 500 MG tablet Take 1,000 mg by mouth every 6 (six) hours as needed for mild pain or headache.    Marland Kitchen amiodarone (PACERONE) 200 MG tablet TAKE 1/2 TABLET (100 MG TOTAL) BY MOUTH DAILY. PLEASE KEEP UPCOMING APPT WITH DR. Eden Emms IN NOVEMBER BEFORE ANYMORE REFILLS. FINAL ATTEMPT 45 tablet 3  . ELIQUIS 5 MG TABS tablet TAKE 1 TABLET BY MOUTH 2 (TWO) TIMES DAILY. PLEASE KEEP LAB APPT FOR FURTHER REFILLS. THANKS 60 tablet 5  . metoprolol tartrate (LOPRESSOR) 50 MG tablet TAKE 1 TABLET (50 MG TOTAL) BY MOUTH 2 (TWO) TIMES DAILY. PLEASE KEEP UPCOMING APPT WITH DR. Eden Emms FOR NOVEMBER BEFORE ANYMORE REFILLS. FINAL ATTEMPT 180 tablet 3   No current facility-administered medications for this visit.    Allergies  Patient has no known allergies.  Electrocardiogram:  10/09/19 SR q waves 3,F no changes   Assessment and Plan  HOCM:f/u echo non obstructive continue beta blocker  AICD: Appropriate d/c for VF , AF Rx turned off f/u SK generator change 10/2017  PAF:  PaceArt <  1% burden sent note to JA/SK regarding non ideal nature of long term amiodarone in patient so young PFT;s 12/12/19 reviewed and normal with normal DLCO Will d/c amiodarone and consider ablation if afib burden increases on PaCEART OSA: self regulated CPAP discussed weight loss    [name]

## 2020-11-20 ENCOUNTER — Ambulatory Visit (INDEPENDENT_AMBULATORY_CARE_PROVIDER_SITE_OTHER): Payer: Managed Care, Other (non HMO)

## 2020-11-20 DIAGNOSIS — I421 Obstructive hypertrophic cardiomyopathy: Secondary | ICD-10-CM | POA: Diagnosis not present

## 2020-11-22 LAB — CUP PACEART REMOTE DEVICE CHECK
Battery Remaining Longevity: 97 mo
Battery Voltage: 2.92 V
Brady Statistic AP VP Percent: 3.25 %
Brady Statistic AP VS Percent: 2.03 %
Brady Statistic AS VP Percent: 0.03 %
Brady Statistic AS VS Percent: 94.68 %
Brady Statistic RA Percent Paced: 5.27 %
Brady Statistic RV Percent Paced: 3.25 %
Date Time Interrogation Session: 20211222033425
HighPow Impedance: 52 Ohm
HighPow Impedance: 67 Ohm
Implantable Lead Implant Date: 20110921
Implantable Lead Implant Date: 20110921
Implantable Lead Location: 753859
Implantable Lead Location: 753860
Implantable Lead Model: 5076
Implantable Lead Model: 6947
Implantable Pulse Generator Implant Date: 20181231
Lead Channel Impedance Value: 285 Ohm
Lead Channel Impedance Value: 342 Ohm
Lead Channel Impedance Value: 456 Ohm
Lead Channel Pacing Threshold Amplitude: 0.5 V
Lead Channel Pacing Threshold Amplitude: 0.875 V
Lead Channel Pacing Threshold Pulse Width: 0.4 ms
Lead Channel Pacing Threshold Pulse Width: 0.4 ms
Lead Channel Sensing Intrinsic Amplitude: 3.625 mV
Lead Channel Sensing Intrinsic Amplitude: 3.625 mV
Lead Channel Sensing Intrinsic Amplitude: 5.875 mV
Lead Channel Sensing Intrinsic Amplitude: 5.875 mV
Lead Channel Setting Pacing Amplitude: 2 V
Lead Channel Setting Pacing Amplitude: 2.5 V
Lead Channel Setting Pacing Pulse Width: 0.4 ms
Lead Channel Setting Sensing Sensitivity: 0.3 mV

## 2020-12-04 NOTE — Progress Notes (Signed)
Remote ICD transmission.   

## 2020-12-05 ENCOUNTER — Other Ambulatory Visit: Payer: Self-pay | Admitting: Cardiovascular Disease

## 2020-12-05 DIAGNOSIS — I480 Paroxysmal atrial fibrillation: Secondary | ICD-10-CM

## 2020-12-05 MED ORDER — APIXABAN 5 MG PO TABS: ORAL_TABLET | ORAL | 0 refills | Status: AC

## 2020-12-05 MED ORDER — APIXABAN 5 MG PO TABS
ORAL_TABLET | ORAL | 5 refills | Status: DC
Start: 2020-12-05 — End: 2020-12-05

## 2020-12-05 NOTE — Telephone Encounter (Signed)
Prescription refill request for Eliquis received from [facility].   Indication: Afib Last office visit: 10/28/2020 Scr: 1.52, 11/29/2019 Age: 50 yo Weight: 113.4 kg    Pt is over due for blood work. Called and spoke to pt, scheduled him to come in on 12/27/2020 to get blood work. Will send in a 1 month supply so pt does not miss any doses of Eliquis.

## 2020-12-27 ENCOUNTER — Other Ambulatory Visit: Payer: Managed Care, Other (non HMO) | Admitting: *Deleted

## 2020-12-27 ENCOUNTER — Other Ambulatory Visit: Payer: Self-pay

## 2020-12-27 DIAGNOSIS — I480 Paroxysmal atrial fibrillation: Secondary | ICD-10-CM

## 2020-12-27 LAB — BASIC METABOLIC PANEL
BUN/Creatinine Ratio: 11 (ref 9–20)
BUN: 15 mg/dL (ref 6–24)
CO2: 21 mmol/L (ref 20–29)
Calcium: 9.2 mg/dL (ref 8.7–10.2)
Chloride: 104 mmol/L (ref 96–106)
Creatinine, Ser: 1.32 mg/dL — ABNORMAL HIGH (ref 0.76–1.27)
GFR calc Af Amer: 73 mL/min/{1.73_m2} (ref >59)
GFR calc non Af Amer: 63 mL/min/{1.73_m2} (ref >59)
Glucose: 148 mg/dL — ABNORMAL HIGH (ref 65–99)
Potassium: 4.3 mmol/L (ref 3.5–5.2)
Sodium: 141 mmol/L (ref 134–144)

## 2020-12-27 LAB — CBC
Hematocrit: 42.7 % (ref 37.5–51.0)
Hemoglobin: 14.4 g/dL (ref 13.0–17.7)
MCH: 32.6 pg (ref 26.6–33.0)
MCHC: 33.7 g/dL (ref 31.5–35.7)
MCV: 97 fL (ref 79–97)
Platelets: 333 10*3/uL (ref 150–450)
RBC: 4.42 x10E6/uL (ref 4.14–5.80)
RDW: 13.5 % (ref 11.6–15.4)
WBC: 7.6 10*3/uL (ref 3.4–10.8)

## 2021-02-02 DIAGNOSIS — I4729 Other ventricular tachycardia: Secondary | ICD-10-CM | POA: Insufficient documentation

## 2021-02-02 DIAGNOSIS — I472 Ventricular tachycardia: Secondary | ICD-10-CM | POA: Insufficient documentation

## 2021-02-03 ENCOUNTER — Other Ambulatory Visit: Payer: Self-pay

## 2021-02-03 ENCOUNTER — Encounter: Payer: Self-pay | Admitting: Internal Medicine

## 2021-02-03 ENCOUNTER — Ambulatory Visit: Payer: Managed Care, Other (non HMO) | Admitting: Internal Medicine

## 2021-02-03 VITALS — BP 130/80 | HR 94 | Ht 67.0 in | Wt 260.0 lb

## 2021-02-03 DIAGNOSIS — I4729 Other ventricular tachycardia: Secondary | ICD-10-CM

## 2021-02-03 DIAGNOSIS — I422 Other hypertrophic cardiomyopathy: Secondary | ICD-10-CM | POA: Diagnosis not present

## 2021-02-03 DIAGNOSIS — Z95810 Presence of automatic (implantable) cardiac defibrillator: Secondary | ICD-10-CM | POA: Diagnosis not present

## 2021-02-03 DIAGNOSIS — I472 Ventricular tachycardia: Secondary | ICD-10-CM | POA: Diagnosis not present

## 2021-02-03 DIAGNOSIS — I480 Paroxysmal atrial fibrillation: Secondary | ICD-10-CM | POA: Diagnosis not present

## 2021-02-03 NOTE — Patient Instructions (Signed)

## 2021-02-03 NOTE — Progress Notes (Signed)
Patient Care Team: Sharlene Dory, DO as PCP - General (Family Medicine) Wendall Stade, MD as PCP - Cardiology (Cardiology) Duke Salvia, MD as Consulting Physician (Cardiology)   HPI  Charles Wyatt is a 50 y.o. male seen in followup for an ICD  The device originally implanted for HCM in the setting of a family history of sudden death and significant septal thickness and hyperenhancing on cardiac MRI. He has hx of inappropriate shock from AFib RVR. On apixoban  He underwent ICD generator replacement 12/18.   He was seen in the emergency room 11/05/16 following 3 ICD shocks. They were recorded by the fellow as appropriate. He declined admission  Review of these data however demonstrates that he had nonsustained ventricular tachycardia and shocks were delivered because of commitment following charging on shock 1 and with recurrent nonsustained ventricular tachycardia, subsequent shocks. This occurred in the context of having taken a new weight loss stimulants. He was also not taking his beta blocker.  His family history was reviewed. It seems that the gene runs through his mother family and a large kindred has been identified and is being followed in [place] and [place]   He was admitted 9/18 w recurrent AFib with RVR; he was started on amiodarone.  He saw Dr. Fawn Kirk to consider ablation; his low atrial fibrillation burden and left atrial size it was elected at that time to continue medical therapy  Previously but no longer on amiodarone  Modest exercise intolerance.  Weight has been a problem.  Some swelling.  Likely related to sodium intake  Date Cr K TSH LFT Hgb  9/18   4.04     1/19  1.4 4.3 5.29 22 14.8   3/19   4.03    12/20 1.52 4.2 3.82 21   1/22 1.32 4.3   14.4   DATE TEST    9/17 Echo    EF 50-55 %   10/18 Echo    EF 50 % LVH/ LAE (5.8/2.6/67)  MR mile            Past Medical History:  Diagnosis Date  . Biatrial enlargement   . HCM    06 19/2011  ?[facility] ?Tee to R/o subaortic membrane   . Implantable cardiac defibrillator Medtronic   . 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 Persistent atrial fibrillation (HCC)    Inappro shock  . Snoring     Past Surgical History:  Procedure Laterality Date  . CARDIOVERSION N/A 08/20/2017   Procedure: CARDIOVERSION;  Surgeon: Quintella Reichert, MD;  Location: [facility] ENDOSCOPY;  Service: Cardiovascular;  Laterality: N/A;  . CARDIOVERSION N/A 08/24/2017   Procedure: CARDIOVERSION;  Surgeon: Jake Bathe, MD;  Location: [facility] ENDOSCOPY;  Service: Cardiovascular;  Laterality: N/A;  . Defibrillator implantation     Medtronic Protecta 314 DRG ICD implanted by [name] for primary prevention  . PPM GENERATOR CHANGEOUT N/A 11/29/2017   EVERA MRI XT DR ICD implanted by [name] for primary prevention  . TEE WITHOUT CARDIOVERSION N/A 08/20/2017   Procedure: TRANSESOPHAGEAL ECHOCARDIOGRAM (TEE);  Surgeon: Quintella Reichert, MD;  Location: [facility] ENDOSCOPY;  Service: Cardiovascular;  Laterality: N/A;    Current Outpatient Medications  Medication Sig Dispense Refill  . acetaminophen (TYLENOL) 500 MG tablet Take 1,000 mg by mouth every 6 (six) hours as needed for mild pain or headache.    Marland Kitchen apixaban (ELIQUIS) 5 MG TABS tablet TAKE 1 TABLET BY MOUTH 2 (TWO) TIMES  DAILY. PLEASE KEEP LAB APPT FOR FURTHER REFILLS. THANKS 60 tablet 0  . metoprolol tartrate (LOPRESSOR) 50 MG tablet TAKE 1 TABLET (50 MG TOTAL) BY MOUTH 2 (TWO) TIMES DAILY. 180 tablet 3   No current facility-administered medications for this visit.    No Known Allergies    Review of Systems negative except from HPI and PMH  Physical Exam BP 130/80 (BP Location: Left Arm, Patient Position: Sitting, Cuff Size: Normal)   Pulse 94   Ht 5\' 7"  (1.702 m)   Wt 260 lb (117.9 kg)   SpO2 96%   BMI 40.72 kg/m  Well developed and Morbidly obese  in no acute distress HENT normal Neck supple with JVP-flat Clear Device pocket well healed; without hematoma or  erythema.  There is no tethering  Regular rate and rhythm, no  murmur Abd-soft with active BS No Clubbing cyanosis  tr edema Skin-warm and dry A & Oriented  Grossly normal sensory and motor function  ECG sinus at 94 Intervals 18/12/36    Assessment and  Plan HCM  ICD Medtronic The patient's device was interrogated.  The information was reviewed. No changes were made in the programming.     Ventricular tachycardia-nonsustained  Obesity  Atrial fibrillation with a rapid ventricular response   No interval atrial fibrillation  No interval nonsustained ventricular tachycardia, identified originally on Holter monitor 2011  Mild volume overload.  Encourage salt and fluid reduction and exercise

## 2021-02-19 ENCOUNTER — Ambulatory Visit (INDEPENDENT_AMBULATORY_CARE_PROVIDER_SITE_OTHER): Payer: Managed Care, Other (non HMO)

## 2021-02-19 DIAGNOSIS — I422 Other hypertrophic cardiomyopathy: Secondary | ICD-10-CM | POA: Diagnosis not present

## 2021-02-19 DIAGNOSIS — I480 Paroxysmal atrial fibrillation: Secondary | ICD-10-CM

## 2021-02-19 LAB — CUP PACEART REMOTE DEVICE CHECK
Battery Remaining Longevity: 90 mo
Battery Voltage: 2.92 V
Brady Statistic AP VP Percent: 0.17 %
Brady Statistic AP VS Percent: 0.81 %
Brady Statistic AS VP Percent: 0.03 %
Brady Statistic AS VS Percent: 98.99 %
Brady Statistic RA Percent Paced: 0.98 %
Brady Statistic RV Percent Paced: 0.16 %
Date Time Interrogation Session: 20220323012510
HighPow Impedance: 53 Ohm
HighPow Impedance: 69 Ohm
Implantable Lead Implant Date: 20110921
Implantable Lead Implant Date: 20110921
Implantable Lead Location: 753859
Implantable Lead Location: 753860
Implantable Lead Model: 5076
Implantable Lead Model: 6947
Implantable Pulse Generator Implant Date: 20181231
Lead Channel Impedance Value: 304 Ohm
Lead Channel Impedance Value: 361 Ohm
Lead Channel Impedance Value: 456 Ohm
Lead Channel Pacing Threshold Amplitude: 0.5 V
Lead Channel Pacing Threshold Amplitude: 0.75 V
Lead Channel Pacing Threshold Pulse Width: 0.4 ms
Lead Channel Pacing Threshold Pulse Width: 0.4 ms
Lead Channel Sensing Intrinsic Amplitude: 3.75 mV
Lead Channel Sensing Intrinsic Amplitude: 3.75 mV
Lead Channel Sensing Intrinsic Amplitude: 6.125 mV
Lead Channel Sensing Intrinsic Amplitude: 6.125 mV
Lead Channel Setting Pacing Amplitude: 2 V
Lead Channel Setting Pacing Amplitude: 2.5 V
Lead Channel Setting Pacing Pulse Width: 0.4 ms
Lead Channel Setting Sensing Sensitivity: 0.3 mV

## 2021-02-22 ENCOUNTER — Emergency Department (HOSPITAL_BASED_OUTPATIENT_CLINIC_OR_DEPARTMENT_OTHER)
Admission: EM | Admit: 2021-02-22 | Discharge: 2021-02-22 | Disposition: A | Discharge status: Home / Self Care | Payer: Managed Care, Other (non HMO) | Attending: Emergency Medicine | Admitting: Emergency Medicine

## 2021-02-22 ENCOUNTER — Other Ambulatory Visit: Payer: Self-pay

## 2021-02-22 ENCOUNTER — Encounter (HOSPITAL_BASED_OUTPATIENT_CLINIC_OR_DEPARTMENT_OTHER): Payer: Self-pay | Admitting: Emergency Medicine

## 2021-02-22 DIAGNOSIS — R002 Palpitations: Secondary | ICD-10-CM

## 2021-02-22 DIAGNOSIS — I480 Paroxysmal atrial fibrillation: Secondary | ICD-10-CM | POA: Diagnosis not present

## 2021-02-22 DIAGNOSIS — Z7901 Long term (current) use of anticoagulants: Secondary | ICD-10-CM | POA: Insufficient documentation

## 2021-02-22 LAB — BASIC METABOLIC PANEL
Anion gap: 11 (ref 5–15)
BUN: 13 mg/dL (ref 6–20)
CO2: 24 mmol/L (ref 22–32)
Calcium: 9.2 mg/dL (ref 8.9–10.3)
Chloride: 101 mmol/L (ref 98–111)
Creatinine, Ser: 1.36 mg/dL — ABNORMAL HIGH (ref 0.61–1.24)
GFR, Estimated: >60 mL/min (ref >60)
Glucose, Bld: 130 mg/dL — ABNORMAL HIGH (ref 70–99)
Potassium: 3.7 mmol/L (ref 3.5–5.1)
Sodium: 136 mmol/L (ref 135–145)

## 2021-02-22 LAB — CBC
HCT: 45.7 % (ref 39.0–52.0)
Hemoglobin: 15.6 g/dL (ref 13.0–17.0)
MCH: 32.8 pg (ref 26.0–34.0)
MCHC: 34.1 g/dL (ref 30.0–36.0)
MCV: 96 fL (ref 80.0–100.0)
Platelets: 359 10*3/uL (ref 150–400)
RBC: 4.76 MIL/uL (ref 4.22–5.81)
RDW: 12.6 % (ref 11.5–15.5)
WBC: 8.9 10*3/uL (ref 4.0–10.5)
nRBC: 0 % (ref 0.0–0.2)

## 2021-02-22 LAB — MAGNESIUM: Magnesium: 2 mg/dL (ref 1.7–2.4)

## 2021-02-22 NOTE — ED Provider Notes (Signed)
[facility] EMERGENCY DEPARTMENT Provider Note   CSN: [id number] Arrival date & time: 02/22/21  1756     History Chief Complaint  Patient presents with  . Palpitations    Charles Wyatt is a 50 y.o. male.  50 year old male with complex cardiac history including HCM with implantable cardiac defibrillator (Medtronic), paroxysmal A. fib on Eliquis.  Patient states that yesterday at lunchtime he noticed that his heart was beating extra hard and feels like he has skipped beats at times.  Patient tried to interrogate his device at home however his hand-held unit was not working.  Patient tried obtaining home EKGs however they were not definitive.  Patient presented to the ER for evaluation.  Patient denies chest pain, shortness of breath, diaphoresis or any other complaints or concerns.        Past Medical History:  Diagnosis Date  . Biatrial enlargement   . HCM    06 19/2011 ?[facility] ?Tee to R/o subaortic membrane   . Implantable cardiac defibrillator Medtronic   . 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 Persistent atrial fibrillation (HCC)    Inappro shock  . Snoring     Patient Active Problem List   Diagnosis Date Noted  . NSVT (nonsustained ventricular tachycardia) (HCC) 02/02/2021  . Atrial fibrillation (HCC)   . Shortness of breath   . PAF (paroxysmal atrial fibrillation) (HCC) 05/19/2013  . 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  . OBESITY 05/20/2010  . Hypertrophic cardiomyopathy (HCC) 05/20/2010  . ANEMIA 05/19/2010  . MURMUR 05/19/2010    Past Surgical History:  Procedure Laterality Date  . CARDIOVERSION N/A 08/20/2017   Procedure: CARDIOVERSION;  Surgeon: Quintella Reichert, MD;  Location: [facility] ENDOSCOPY;  Service: Cardiovascular;  Laterality: N/A;  . CARDIOVERSION N/A 08/24/2017   Procedure: CARDIOVERSION;  Surgeon: Jake Bathe, MD;  Location: [facility] ENDOSCOPY;  Service: Cardiovascular;  Laterality: N/A;  . Defibrillator  implantation     Medtronic Protecta 314 DRG ICD implanted by [name] for primary prevention  . PPM GENERATOR CHANGEOUT N/A 11/29/2017   EVERA MRI XT DR ICD implanted by [name] for primary prevention  . TEE WITHOUT CARDIOVERSION N/A 08/20/2017   Procedure: TRANSESOPHAGEAL ECHOCARDIOGRAM (TEE);  Surgeon: Quintella Reichert, MD;  Location: [facility] ENDOSCOPY;  Service: Cardiovascular;  Laterality: N/A;       Family History  Problem Relation Age of Onset  . Cardiomyopathy Other        hypertensive obstructive cardiomyopathy    Social History   Tobacco Use  . Smoking status: Never Smoker  . Smokeless tobacco: Never Used  Vaping Use  . Vaping Use: Never used  Substance Use Topics  . Alcohol use: Yes    Comment: occasional   . Drug use: No    Home Medications Prior to Admission medications   Medication Sig Start Date End Date Taking? Authorizing Provider  acetaminophen (TYLENOL) 500 MG tablet Take 1,000 mg by mouth every 6 (six) hours as needed for mild pain or headache.   Yes [provider]  apixaban (ELIQUIS) 5 MG TABS tablet TAKE 1 TABLET BY MOUTH 2 (TWO) TIMES DAILY. PLEASE KEEP LAB APPT FOR FURTHER REFILLS. THANKS 12/05/20  Yes Wendall Stade, MD  metoprolol tartrate (LOPRESSOR) 50 MG tablet TAKE 1 TABLET (50 MG TOTAL) BY MOUTH 2 (TWO) TIMES DAILY. 12/05/20  Yes Wendall Stade, MD    Allergies    Patient has no known allergies.  Review of Systems   Review of Systems  Constitutional: Negative for chills and fever.  Respiratory: Negative for chest tightness and shortness of breath.   Cardiovascular: Positive for palpitations. Negative for chest pain and leg swelling.  Gastrointestinal: Negative for abdominal pain, nausea and vomiting.  Musculoskeletal: Negative for arthralgias and myalgias.  Skin: Negative for rash and wound.  Allergic/Immunologic: Negative for immunocompromised state.  Neurological: Negative for dizziness and weakness.  All other systems reviewed and  are negative.   Physical Exam Updated Vital Signs BP 108/72   Pulse 73   Temp 98.5 F (36.9 C) (Oral)   Resp 10   Ht 5\' 7"  (1.702 m)   Wt 115.7 kg   SpO2 94%   BMI 39.94 kg/m   Physical Exam Vitals and nursing note reviewed.  Constitutional:      General: He is not in acute distress.    Appearance: He is well-developed. He is not diaphoretic.  HENT:     Head: Normocephalic and atraumatic.  Eyes:     Conjunctiva/sclera: Conjunctivae normal.  Cardiovascular:     Rate and Rhythm: Normal rate and regular rhythm.     Pulses: Normal pulses.     Heart sounds: Normal heart sounds.  Pulmonary:     Effort: Pulmonary effort is normal.     Breath sounds: Normal breath sounds.  Skin:    General: Skin is warm and dry.     Findings: No erythema or rash.  Neurological:     Mental Status: He is alert and oriented to person, place, and time.  Psychiatric:        Behavior: Behavior normal.     ED Results / Procedures / Treatments   Labs (all labs ordered are listed, but only abnormal results are displayed) Labs Reviewed - No data to display  EKG EKG Interpretation  Date/Time:  Saturday February 22 2021 18:09:17 EDT Ventricular Rate:  81 PR Interval:    QRS Duration: 112 QT Interval:  370 QTC Calculation: 430 R Axis:   -29 Text Interpretation: Sinus rhythm Atrial premature complex Incomplete left bundle branch block Borderline low voltage, extremity leads Confirmed by [id number] (Marianna Fuss) on 02/22/2021 6:46:46 PM   Radiology No results found.  Procedures Procedures   Medications Ordered in ED Medications - No data to display  ED Course  I have reviewed the triage vital signs and the nursing notes.  Pertinent labs & imaging results that were available during my care of the patient were reviewed by me and considered in my medical decision making (see chart for details).  Clinical Course as of 02/22/21 1945  Sat Feb 22, 2021  8062 50 year old male with complaint  of heart beating hard and skipping occasional beats, no other complaints. Requested device interrogation. [LM]  1937 EKG without acute changes.  [LM]  1937 Care signed out at change of shift pending Medtronic report.  [LM]    Clinical Course User Index [LM] 52   MDM Rules/Calculators/A&P                          Final Clinical Impression(s) / ED Diagnoses Final diagnoses:  Palpitations    Rx / DC Orders ED Discharge Orders    None       Alden Hipp 02/22/21 1945    02/24/21, MD 02/22/21 2232

## 2021-02-22 NOTE — ED Notes (Signed)
Spoke with Kathlene November regarding pacemaker interrogation results.  Resending now.  Provider made aware.

## 2021-02-22 NOTE — Discharge Instructions (Signed)
Follow-up with cardiology.  Return to ER if you have worsening palpitations, chest pain, device firing or other new concerning symptom.

## 2021-02-22 NOTE — ED Triage Notes (Signed)
States has been having palpitations at times since yesterday , denies CP or SOB, feels anxious

## 2021-02-28 NOTE — Telephone Encounter (Signed)
Please Inform Patient ECG in ER showed PACs not PVCs  if still ongoing we cna use a 7d zio  . Thanks

## 2021-02-28 NOTE — Progress Notes (Signed)
Remote ICD transmission.   

## 2021-03-05 NOTE — Telephone Encounter (Signed)
Attempted to contact patient to send manual transmission. No answer, LMOVM.

## 2021-03-05 NOTE — Telephone Encounter (Signed)
Patient returned call. States he is not at home to send a manual transmission. Advised patient if he needs help or has difficulty to please call the device clinic and we will be happy to help him. States he will send it tonight when he gets home.

## 2021-04-02 ENCOUNTER — Telehealth: Payer: Self-pay | Admitting: Internal Medicine

## 2021-04-02 ENCOUNTER — Telehealth: Payer: Self-pay

## 2021-04-02 NOTE — Telephone Encounter (Signed)
Spoke with pt who reports increasing palpitations over the last 2 days but has had more this morning after showering.  When he feels the palpitation he states he also feels if he has to take a breath.  He also reports some mild lightheadedness but has not passed out.  He states he is feeling fine now.  He reports he is compliant with his medications.  Pt has sent a remote transmission for review by device clinic.   Will forward this to device clinic for review.  Reviewed ED precautions with pt.  Pt verbalizes understanding and agrees with current plan.

## 2021-04-02 NOTE — Telephone Encounter (Signed)
Spoke with pt and advised d/t increasing symptoms appointment scheduled with Francis Dowse for 04/03/2021 at 115pm.  Reiterated ED precautions.  Pt verbalizes understanding and agrees with current plan.

## 2021-04-02 NOTE — Telephone Encounter (Signed)
Per Dr. Graciela Husbands,  Note added in patient apt. Tomorrow 04/03/21.

## 2021-04-02 NOTE — Telephone Encounter (Signed)
Patient c/o Palpitations:  High priority if patient c/o lightheadedness, shortness of breath, or chest pain  1) How long have you had palpitations/irregular HR/ Afib? Are you having the symptoms now? This morning,   2) Are you currently experiencing lightheadedness, SOB or CP? Lightheaded couple times, SOB, comes and goes sporatic  3) Do you have a history of afib (atrial fibrillation) or irregular heart rhythm? yes  4) Have you checked your BP or HR? (document readings if available): HR 91 right now  5) Are you experiencing any other symptoms?   Patient states he started having palpitations this morning. He states he has also been having sporadic SOB and lightheadedness this morning. He states he has been getting skipped beats, but feels okay right now. He states he is by himself, but a friend is coming to stay with him. He states he also sent in a transmission this morning.

## 2021-04-02 NOTE — Telephone Encounter (Signed)
Transmission reviewed and received 04/02/21. Presenting rhythm AS/VS 90's. 13 events logged. Some appear (1:1) and others appear possibly tachy then regular rates. Does appear Optivol is trending up although not above threshold. Patient scheduled to see Quincy Simmonds, PA-C 04/03/21.

## 2021-04-03 ENCOUNTER — Other Ambulatory Visit: Payer: Self-pay

## 2021-04-03 ENCOUNTER — Encounter: Payer: Self-pay | Admitting: Physician Assistant

## 2021-04-03 ENCOUNTER — Ambulatory Visit: Payer: Managed Care, Other (non HMO) | Admitting: Physician Assistant

## 2021-04-03 VITALS — BP 120/78 | HR 84 | Ht 67.0 in | Wt 246.8 lb

## 2021-04-03 DIAGNOSIS — I471 Supraventricular tachycardia: Secondary | ICD-10-CM

## 2021-04-03 DIAGNOSIS — I421 Obstructive hypertrophic cardiomyopathy: Secondary | ICD-10-CM | POA: Diagnosis not present

## 2021-04-03 DIAGNOSIS — I472 Ventricular tachycardia, unspecified: Secondary | ICD-10-CM

## 2021-04-03 LAB — CUP PACEART INCLINIC DEVICE CHECK
Battery Remaining Longevity: 87 mo
Battery Voltage: 2.91 V
Brady Statistic AP VP Percent: 0.9 %
Brady Statistic AP VS Percent: 3.66 %
Brady Statistic AS VP Percent: 0.04 %
Brady Statistic AS VS Percent: 95.4 %
Brady Statistic RA Percent Paced: 4.54 %
Brady Statistic RV Percent Paced: 0.88 %
Date Time Interrogation Session: 20220505164528
HighPow Impedance: 58 Ohm
HighPow Impedance: 80 Ohm
Implantable Lead Implant Date: 20110921
Implantable Lead Implant Date: 20110921
Implantable Lead Location: 753859
Implantable Lead Location: 753860
Implantable Lead Model: 5076
Implantable Lead Model: 6947
Implantable Pulse Generator Implant Date: 20181231
Lead Channel Impedance Value: 342 Ohm
Lead Channel Impedance Value: 399 Ohm
Lead Channel Impedance Value: 513 Ohm
Lead Channel Pacing Threshold Amplitude: 0.5 V
Lead Channel Pacing Threshold Amplitude: 0.75 V
Lead Channel Pacing Threshold Pulse Width: 0.4 ms
Lead Channel Pacing Threshold Pulse Width: 0.4 ms
Lead Channel Sensing Intrinsic Amplitude: 3.625 mV
Lead Channel Sensing Intrinsic Amplitude: 5.375 mV
Lead Channel Sensing Intrinsic Amplitude: 5.5 mV
Lead Channel Sensing Intrinsic Amplitude: 6 mV
Lead Channel Setting Pacing Amplitude: 2 V
Lead Channel Setting Pacing Amplitude: 2.5 V
Lead Channel Setting Pacing Pulse Width: 0.4 ms
Lead Channel Setting Sensing Sensitivity: 0.3 mV

## 2021-04-03 MED ORDER — METOPROLOL TARTRATE 100 MG PO TABS
100.0000 mg | ORAL_TABLET | Freq: Two times a day (BID) | ORAL | 1 refills | Status: DC
Start: 2021-04-03 — End: 2021-05-06

## 2021-04-03 NOTE — Patient Instructions (Signed)
Medication Instructions:    START TAKING LOPRESSOR 100 MG TWICE A DAY    *If you need a refill on your cardiac medications before your next appointment, please call your pharmacy*  Lab Work: NONE ORDERED  TODAY   If you have labs (blood work) drawn today and your tests are completely normal, you will receive your results only by: Marland Kitchen MyChart Message (if you have MyChart) OR . A paper copy in the mail If you have any lab test that is abnormal or we need to change your treatment, we will call you to review the results.   Testing/Procedures: NONE ORDERED  TODAY   Follow-Up: At [facility], you and your health needs are our priority.  As part of our continuing mission to provide you with exceptional heart care, we have created designated Provider Care Teams.  These Care Teams include your primary Cardiologist (physician) and Advanced Practice Providers (APPs -  Physician Assistants and Nurse Practitioners) who all work together to provide you with the care you need, when you need it.  We recommend signing up for the patient portal called "MyChart".  Sign up information is provided on this After Visit Summary.  MyChart is used to connect with patients for Virtual Visits (Telemedicine).  Patients are able to view lab/test results, encounter notes, upcoming appointments, etc.  Non-urgent messages can be sent to your provider as well.   To learn more about what you can do with MyChart, go to [email or web address].    Your next appointment:  WITH DR Shary Key   ASAP IN A MONTH NO LONGER (CONTACT [facility])  Other Instructions

## 2021-04-03 NOTE — Progress Notes (Signed)
Cardiology Office Note Date:  04/03/2021  Patient ID:  Diallo Ponder, DOB 1971-06-26, MRN [id number] PCP:  Sharlene Dory, DO  Cardiologist:  Dr. Eden Emms Electrophysiologist: Dr. Graciela Husbands    Chief Complaint: palpitations,  ER visit march  History of Present Illness: Rayquon Uselman is a 50 y.o. male with history of HOCM, Afib, VT  Consult with Dr. Johney Frame 2019, noted biatrial enlargement, discussed ablation, the pt had done significant personal research as well, preferred to continue medical management with low burden.  Dr. Johney Frame suggested Norpace as a potential alternative to amiodarone though deferred to Dr. Graciela Husbands for AAD management.  He saw Dr. Eden Emms Nov 2021 via tele health, doing well, discussed and decided to stop amiodarone and follow for AF burden and re-refer to Dr. Johney Frame if recurrent for re-eval for ablation  He comes today to be seen for Dr. Graciela Husbands, last seen by him  02/03/21, was doing well, off amiodarone, mild volume OL, encouraged salt and fluid restriction.  He had an ER visit 02/22/21 with c/o palpitations, ICD interrogated with one NSVT 10beats only not felt to explain frequency of his palpitatins Discharged from the ER, EKG noting PACs  K+ 3.7 Mag 2.0  TODAY IN the last few weeks, or month he has had an up-tick in palpitations.  He feels the flutter in his chest and reflexively takes a deep breath.  They are brief, only a few seconds. This morning happened a couple times again They don't make him feel bad but are anxiety provoking. Says that they remind him of when we speed up his heart and slow it down for his device checks, very reminiscent of that feeling Nothing has changed outside of being off amiodarone He hasnot missed any medicines, has not been taking in any stimulants of any kind  He feels well otherwise No CP, no SOB No near syncope or syncope.   Device information MDT dual chamber ICD implanted 08/20/2010, gen change 11/29/2017 + appropriate  therapies VT 2017 (in setting of weight loss aid/stimulant and off his BB) + inappropriate shock for AFib w/RVR  AAD  2018 Amiodarone for AFib > d/c Nov 2021 given youn gage and potential side effects  AF history Diagnosed 2014   Past Medical History:  Diagnosis Date  . Biatrial enlargement   . HCM    06 19/2011 ?[facility] ?Tee to R/o subaortic membrane   . Implantable cardiac defibrillator Medtronic   . 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 Persistent atrial fibrillation (HCC)    Inappro shock  . Snoring     Past Surgical History:  Procedure Laterality Date  . CARDIOVERSION N/A 08/20/2017   Procedure: CARDIOVERSION;  Surgeon: Quintella Reichert, MD;  Location: [facility] ENDOSCOPY;  Service: Cardiovascular;  Laterality: N/A;  . CARDIOVERSION N/A 08/24/2017   Procedure: CARDIOVERSION;  Surgeon: Jake Bathe, MD;  Location: [facility] ENDOSCOPY;  Service: Cardiovascular;  Laterality: N/A;  . Defibrillator implantation     Medtronic Protecta 314 DRG ICD implanted by [name] for primary prevention  . PPM GENERATOR CHANGEOUT N/A 11/29/2017   EVERA MRI XT DR ICD implanted by [name] for primary prevention  . TEE WITHOUT CARDIOVERSION N/A 08/20/2017   Procedure: TRANSESOPHAGEAL ECHOCARDIOGRAM (TEE);  Surgeon: Quintella Reichert, MD;  Location: [facility] ENDOSCOPY;  Service: Cardiovascular;  Laterality: N/A;    Current Outpatient Medications  Medication Sig Dispense Refill  . acetaminophen (TYLENOL) 500 MG tablet Take 1,000 mg by mouth every 6 (six) hours as needed for mild  pain or headache.    Marland Kitchen apixaban (ELIQUIS) 5 MG TABS tablet TAKE 1 TABLET BY MOUTH 2 (TWO) TIMES DAILY. PLEASE KEEP LAB APPT FOR FURTHER REFILLS. THANKS 60 tablet 0  . metoprolol tartrate (LOPRESSOR) 100 MG tablet Take 1 tablet (100 mg total) by mouth 2 (two) times daily. 180 tablet 1   No current facility-administered medications for this visit.    Allergies:   Patient has no known allergies.   Social History:  The patient  reports  that he has never smoked. He has never used smokeless tobacco. He reports current alcohol use. He reports that he does not use drugs.   Family History:  The patient's family history includes Cardiomyopathy in an other family member.  ROS:  Please see the history of present illness.    All other systems are reviewed and otherwise negative.   PHYSICAL EXAM:  VS:  BP 120/78   Pulse 84   Ht 5\' 7"  (1.702 m)   Wt 246 lb 12.8 oz (111.9 kg)   SpO2 97%   BMI 38.65 kg/m  BMI: Body mass index is 38.65 kg/m. Well nourished, well developed, in no acute distress HEENT: normocephalic, atraumatic Neck: no JVD, carotid bruits or masses Cardiac:  RRR; no significant murmurs, no rubs, or gallops Lungs:  CTA b/l, no wheezing, rhonchi or rales Abd: soft, nontender MS: no deformity or atrophy Ext: no edema Skin: warm and dry, no rash Neuro:  No gross deficits appreciated Psych: euthymic mood, full affect  ICD site is stable, no tethering or discomfort   EKG:  Done in the ER and reviewed by myself shows  SR 81bpm, PAC  Device interrogation done today with industry and reviewed by myself:  Battery and lead measurements are good NSVT episode (37) All available EGM on today's interrogation and carelink were reviewed with industry as well They all start with A, and appear to be an AT there does look like a morphology change perhaps rate related  I do not think these are VT's All are 5 seconds or less in duration    09/06/2017: TTE Study Conclusions  - Left ventricle: LVEF is approximately 50% with hypokinesis of the  base/mid inferior and inferoseptal walls. The cavity size was  normal. Wall thickness was increased in a pattern of severe LVH.  - Mitral valve: There was mild regurgitation.  - Left atrium: The atrium was moderately to severely dilated.  - Pericardium, extracardiac: A trivial pericardial effusion was  identified.    Recent Labs: 02/22/2021: BUN 13; Creatinine, Ser 1.36;  Hemoglobin 15.6; Magnesium 2.0; Platelets 359; Potassium 3.7; Sodium 136  No results found for requested labs within last 8760 hours.   CrCl cannot be calculated (Patient's most recent lab result is older than the maximum 21 days allowed.).   Wt Readings from Last 3 Encounters:  04/03/21 246 lb 12.8 oz (111.9 kg)  02/22/21 255 lb (115.7 kg)  02/03/21 260 lb (117.9 kg)     Other studies reviewed: Additional studies/records reviewed today include: summarized above  ASSESSMENT AND PLAN:  1. ICD     Intact function     With industry help, AT/AF zone rates were reduced to 150bpm, remains on MONITOR ONLY, no detections are on for atrial therpaies  2. Paroxysmal Afib     CHA2DS2Vasc is one on Eliquis     Amiodarone stopped Nov 2021     No AFib is noted, he has had AT, short with low <1 % burden, but a  clear change/increase in the last month     Surely 2/2 amio washing out  I would like him to see Dr. Graciela Husbands for discussion on alternative AAD, ablation perhaps of the AT In the meantime I have increased his lopressor to 100mg  BID     3. Hx of VT/NSVT     None noted  4. HOCM     OptiVol is on the rise though below threshold     Exam does not suggest volume OL     dscissed importance of minimizing sodium     Dr.  5. Palpitations        Disposition: F/u with Dr. Eden Emms in 41mo, sooner if needed   Current medicines are reviewed at length with the patient today.  The patient did not have any concerns regarding medicines.  3mo, PA-C 04/03/2021 2:02 PM     [facility] [address] [place] [phone] (office)  [phone] (fax)

## 2021-04-10 ENCOUNTER — Observation Stay (HOSPITAL_COMMUNITY)
Admission: EM | Admit: 2021-04-10 | Discharge: 2021-04-11 | Disposition: A | Discharge status: Home / Self Care | Payer: Managed Care, Other (non HMO) | Attending: Family Medicine | Admitting: Family Medicine

## 2021-04-10 ENCOUNTER — Other Ambulatory Visit: Payer: Self-pay

## 2021-04-10 DIAGNOSIS — Z7901 Long term (current) use of anticoagulants: Secondary | ICD-10-CM | POA: Diagnosis not present

## 2021-04-10 DIAGNOSIS — I480 Paroxysmal atrial fibrillation: Secondary | ICD-10-CM | POA: Diagnosis not present

## 2021-04-10 DIAGNOSIS — I4891 Unspecified atrial fibrillation: Secondary | ICD-10-CM | POA: Diagnosis present

## 2021-04-10 DIAGNOSIS — E876 Hypokalemia: Secondary | ICD-10-CM | POA: Insufficient documentation

## 2021-04-10 DIAGNOSIS — E669 Obesity, unspecified: Secondary | ICD-10-CM | POA: Diagnosis present

## 2021-04-10 DIAGNOSIS — Z95810 Presence of automatic (implantable) cardiac defibrillator: Secondary | ICD-10-CM | POA: Insufficient documentation

## 2021-04-10 DIAGNOSIS — I422 Other hypertrophic cardiomyopathy: Secondary | ICD-10-CM

## 2021-04-10 DIAGNOSIS — Z20822 Contact with and (suspected) exposure to covid-19: Secondary | ICD-10-CM | POA: Insufficient documentation

## 2021-04-10 DIAGNOSIS — R0602 Shortness of breath: Secondary | ICD-10-CM | POA: Diagnosis present

## 2021-04-10 DIAGNOSIS — N179 Acute kidney failure, unspecified: Secondary | ICD-10-CM | POA: Diagnosis not present

## 2021-04-10 MED ORDER — METOPROLOL TARTRATE 5 MG/5ML IV SOLN
5.0000 mg | Freq: Once | INTRAVENOUS | Status: AC
  Administered 2021-04-10: 5 mg via INTRAVENOUS
  Filled 2021-04-10: qty 5

## 2021-04-10 MED ORDER — SODIUM CHLORIDE 0.9 % IV BOLUS
1000.0000 mL | Freq: Once | INTRAVENOUS | Status: AC
  Administered 2021-04-10: 1000 mL via INTRAVENOUS

## 2021-04-10 NOTE — ED Provider Notes (Signed)
[facility] EMERGENCY DEPARTMENT Provider Note  CSN: [id number] Arrival date & time: 04/10/21 2255  Chief Complaint(s) Shortness of Breath and Atrial Fibrillation  HPI Charles Wyatt is a 50 y.o. male started having SOB and tachycardia this evening after 2 drinks of alcohol and eating pizza. Patient took his metop and Eliquis this evening w/o relief. Had his Metop increased last week to 100 mg BID. SOB is mild but constant. Related to the tachycardia.  He denies CP. No recent fevers or infection, coughing or congestion, N/V/D.  Patient called EMS who noted he was in [facility]. He given 30mg  of Dilt w/o improvement.  HPI  Past Medical History Past Medical History:  Diagnosis Date  . Biatrial enlargement   . HCM    06 19/2011 ?[facility] ?Tee to R/o subaortic membrane   . Implantable cardiac defibrillator Medtronic   . 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 Persistent atrial fibrillation (HCC)    Inappro shock  . Snoring    Patient Active Problem List   Diagnosis Date Noted  . NSVT (nonsustained ventricular tachycardia) (HCC) 02/02/2021  . Atrial fibrillation (HCC)   . Shortness of breath   . PAF (paroxysmal atrial fibrillation) (HCC) 05/19/2013  . 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  . OBESITY 05/20/2010  . Hypertrophic cardiomyopathy (HCC) 05/20/2010  . ANEMIA 05/19/2010  . MURMUR 05/19/2010   Home Medication(s) Prior to Admission medications   Medication Sig Start Date End Date Taking? Authorizing Provider  acetaminophen (TYLENOL) 500 MG tablet Take 1,000 mg by mouth every 6 (six) hours as needed for mild pain or headache.   Yes [provider]  apixaban (ELIQUIS) 5 MG TABS tablet TAKE 1 TABLET BY MOUTH 2 (TWO) TIMES DAILY. PLEASE KEEP LAB APPT FOR FURTHER REFILLS. THANKS Patient taking differently: Take 5 mg by mouth 2 (two) times daily. 12/05/20  Yes 02/02/21, MD  metoprolol tartrate (LOPRESSOR) 100 MG tablet Take  1 tablet (100 mg total) by mouth 2 (two) times daily. 04/03/21  Yes 06/03/21, PA-C                                                                                                                                    Past Surgical History Past Surgical History:  Procedure Laterality Date  . CARDIOVERSION N/A 08/20/2017   Procedure: CARDIOVERSION;  Surgeon: 08/22/2017, MD;  Location: [facility] ENDOSCOPY;  Service: Cardiovascular;  Laterality: N/A;  . CARDIOVERSION N/A 08/24/2017   Procedure: CARDIOVERSION;  Surgeon: 08/26/2017, MD;  Location: [facility] ENDOSCOPY;  Service: Cardiovascular;  Laterality: N/A;  . Defibrillator implantation     Medtronic Protecta 314 DRG ICD implanted by Dr [facility] for primary prevention  . PPM GENERATOR CHANGEOUT N/A 11/29/2017   EVERA MRI XT DR ICD implanted by Dr 12/01/2017 for primary prevention  . TEE WITHOUT CARDIOVERSION N/A 08/20/2017   Procedure: TRANSESOPHAGEAL ECHOCARDIOGRAM (TEE);  Surgeon:  Quintella Reichert, MD;  Location: [facility] ENDOSCOPY;  Service: Cardiovascular;  Laterality: N/A;   Family History Family History  Problem Relation Age of Onset  . Cardiomyopathy Other        hypertensive obstructive cardiomyopathy    Social History Social History   Tobacco Use  . Smoking status: Never Smoker  . Smokeless tobacco: Never Used  Vaping Use  . Vaping Use: Never used  Substance Use Topics  . Alcohol use: Yes    Comment: occasional   . Drug use: No   Allergies Patient has no known allergies.  Review of Systems Review of Systems All other systems are reviewed and are negative for acute change except as noted in the HPI  Physical Exam Vital Signs  I have reviewed the triage vital signs BP (!) 133/96 (BP Location: Right Arm)   Pulse (!) 140   Temp 98 F (36.7 C) (Oral)   Resp 18   Ht  (1.702 m)   Wt 108.9 kg   SpO2 97%   BMI 37.59 kg/m   Physical Exam Vitals reviewed.  Constitutional:      General: He is not in acute distress.     Appearance: He is well-developed. He is not diaphoretic.  HENT:     Head: Normocephalic and atraumatic.     Nose: Nose normal.  Eyes:     General: No scleral icterus.       Right eye: No discharge.        Left eye: No discharge.     Conjunctiva/sclera: Conjunctivae normal.     Pupils: Pupils are equal, round, and reactive to light.  Cardiovascular:     Rate and Rhythm: Tachycardia present. Rhythm irregularly irregular.     Heart sounds: No murmur heard. No friction rub. No gallop.   Pulmonary:     Effort: Pulmonary effort is normal. No respiratory distress.     Breath sounds: Normal breath sounds. No stridor. No rales.  Abdominal:     General: There is no distension.     Palpations: Abdomen is soft.     Tenderness: There is no abdominal tenderness.  Musculoskeletal:        General: No tenderness.     Cervical back: Normal range of motion and neck supple.  Skin:    General: Skin is warm and dry.     Findings: No erythema or rash.  Neurological:     Mental Status: He is alert and oriented to person, place, and time.     ED Results and Treatments Labs (all labs ordered are listed, but only abnormal results are displayed) Labs Reviewed  CBC WITH DIFFERENTIAL/PLATELET - Abnormal; Notable for the following components:      Result Value   WBC 11.0 (*)    Neutro Abs 9.0 (*)    All other components within normal limits  COMPREHENSIVE METABOLIC PANEL - Abnormal; Notable for the following components:   Potassium 3.2 (*)    Glucose, Bld 151 (*)    Creatinine, Ser 1.38 (*)    Calcium 8.8 (*)    Total Protein 6.3 (*)    All other components within normal limits  SARS CORONAVIRUS 2 (TAT 6-24 HRS)  MAGNESIUM  EKG  EKG Interpretation  Date/Time:  Thursday Apr 10 2021 23:03:53 EDT Ventricular Rate:  144 PR Interval:    QRS Duration: 112 QT Interval:  289 QTC  Calculation: 448 R Axis:   -22 Text Interpretation: Atrial fibrillation with RVR Incomplete left bundle branch block Minimal ST elevation, inferior leads Confirmed by Drema Pry [phone]) on 04/11/2021 1:43:50 AM      Radiology No results found.  Pertinent labs & imaging results that were available during my care of the patient were reviewed by me and considered in my medical decision making (see chart for details).  Medications Ordered in ED Medications  etomidate (AMIDATE) injection 15 mg (15 mg Intravenous Not Given 04/11/21 0229)  sodium chloride 0.9 % bolus 1,000 mL (0 mLs Intravenous Stopped 04/11/21 0056)  metoprolol tartrate (LOPRESSOR) injection 5 mg (5 mg Intravenous Given 04/10/21 2338)  metoprolol tartrate (LOPRESSOR) injection 5 mg (5 mg Intravenous Given 04/11/21 0019)  potassium chloride 10 mEq in 100 mL IVPB (0 mEq Intravenous Stopped 04/11/21 0322)  etomidate (AMIDATE) injection (15 mg Intravenous Given 04/11/21 0214)                                                                                                                                    Procedures .Sedation  Date/Time: 04/11/2021 4:35 AM Performed by: Nira Conn, MD Authorized by: Nira Conn, MD   Consent:    Consent obtained:  Written   Consent given by:  Patient   Risks discussed:  Allergic reaction, prolonged sedation necessitating reversal, respiratory compromise necessitating ventilatory assistance and intubation, vomiting and nausea Universal protocol:    Procedure explained and questions answered to patient or proxy's satisfaction: yes     Relevant documents present and verified: yes     Immediately prior to procedure, a time out was called: yes   Indications:    Procedure performed:  Cardioversion Pre-sedation assessment:    Time since last food or drink:  2200   ASA classification: class 3 - patient with severe systemic disease     Mallampati score:  II - soft palate,  uvula, fauces visible   Neck mobility: normal     Pre-sedation assessments completed and reviewed: airway patency, cardiovascular function, hydration status, mental status, nausea/vomiting, pain level and respiratory function     Pre-sedation assessment completed:  04/11/2021 3:17 AM Immediate pre-procedure details:    Reassessment: Patient reassessed immediately prior to procedure     Reviewed: vital signs     Verified: bag valve mask available, emergency equipment available, intubation equipment available, IV patency confirmed, oxygen available and suction available   Procedure details (see MAR for exact dosages):    Sedation:  Etomidate   Intended level of sedation: deep   Intra-procedure monitoring:  Blood pressure monitoring, continuous capnometry, continuous pulse oximetry, cardiac monitor, frequent vital sign checks and frequent LOC assessments   Intra-procedure events: none  Total Provider sedation time (minutes):  15 Post-procedure details:    Post-sedation assessment completed:  04/11/2021 1:58 AM   Attendance: Constant attendance by certified staff until patient recovered     Recovery: Patient returned to pre-procedure baseline     Post-sedation assessments completed and reviewed: airway patency, cardiovascular function, hydration status, mental status, nausea/vomiting, pain level and respiratory function     Patient is stable for discharge or admission: yes     Procedure completion:  Tolerated well, no immediate complications .Cardioversion  Date/Time: 04/11/2021 4:38 AM Performed by: Nira Conn, MD Authorized by: Nira Conn, MD   Consent:    Consent obtained:  Written   Consent given by:  Patient   Risks discussed:  Cutaneous burn, death and induced arrhythmia   Alternatives discussed:  Rate-control medication Pre-procedure details:    Cardioversion basis:  Emergent   Rhythm:  Atrial fibrillation   Electrode placement:   Anterior-posterior Patient sedated: Yes. Refer to sedation procedure documentation for details of sedation.  Attempt one:    Cardioversion mode:  Synchronous   Waveform:  Biphasic   Shock (Joules):  120   Shock outcome:  No change in rhythm Attempt two:    Cardioversion mode:  Synchronous   Waveform:  Biphasic   Shock (Joules):  150   Shock outcome:  No change in rhythm Attempt three:    Cardioversion mode:  Synchronous   Waveform:  Biphasic   Shock (Joules):  200   Shock outcome:  Conversion to normal sinus rhythm Post-procedure details:    Patient status:  Awake   Patient tolerance of procedure:  Tolerated well, no immediate complications .1-3 Lead EKG Interpretation Performed by: Nira Conn, MD Authorized by: Nira Conn, MD     Interpretation: abnormal     ECG rate:  87   ECG rate assessment: normal     Rhythm: sinus rhythm     Ectopy: PAC     Conduction: normal   .Critical Care Performed by: Nira Conn, MD Authorized by: Nira Conn, MD   Critical care provider statement:    Critical care time (minutes):  80   Critical care was necessary to treat or prevent imminent or life-threatening deterioration of the following conditions:  Cardiac failure   Critical care was time spent personally by me on the following activities:  Discussions with consultants, evaluation of patient's response to treatment, examination of patient, ordering and performing treatments and interventions, ordering and review of laboratory studies, ordering and review of radiographic studies, pulse oximetry, re-evaluation of patient's condition, obtaining history from patient or surrogate and review of old charts   Care discussed with: admitting provider      (including critical care time)  Medical Decision Making / ED Course I have reviewed the nursing notes for this encounter and the patient's prior records (if available in EHR or on provided  paperwork).   Charles Wyatt was evaluated in Emergency Department on 04/11/2021 for the symptoms described in the history of present illness. He was evaluated in the context of the global COVID-19 pandemic, which necessitated consideration that the patient might be at risk for infection with the SARS-CoV-2 virus that causes COVID-19. Institutional protocols and algorithms that pertain to the evaluation of patients at risk for COVID-19 are in a state of rapid change based on information released by regulatory bodies including the CDC and federal and state organizations. These policies and algorithms were followed during the patient's care in the ED.  Patient presents with A. fib RVR.  No chest pain.  Endorsing shortness of breath.  Currently hemodynamically stable.  Unable to rate control/convert with diltiazem by EMS. Patient was given 2 doses of Lopressor with no improvement. IV fluids also given Screening labs notable for mild hypokalemia which was repleted the IV.  Case discussed with cardiology and patient who both agreed with cardioversion and reassess.  Patient was successfully cardioverted after 3 attempts under conscious sedation.  Patient is having frequent PACs and a few runs of bursts A. fib runs. Patient is feeling fatigued.  Will discuss admission with hospitalist team and cardiology will follow to assess for any additional medication changes.      Final Clinical Impression(s) / ED Diagnoses Final diagnoses:  Atrial fibrillation with RVR (HCC)  Hypokalemia      This chart was dictated using voice recognition software.  Despite best efforts to proofread,  errors can occur which can change the documentation meaning.   Nira Connardama, Otis Burress Eduardo, MD 04/11/21 [phone]

## 2021-04-10 NOTE — ED Triage Notes (Signed)
Hx of afib rvr, at this time ranging 140-180's received cardizem 30mg  with ems.  Has had not change.  Patient has been cardioverted before for similar.  Also complains of sob

## 2021-04-11 ENCOUNTER — Other Ambulatory Visit: Payer: Self-pay | Admitting: Physician Assistant

## 2021-04-11 ENCOUNTER — Telehealth: Payer: Self-pay | Admitting: Internal Medicine

## 2021-04-11 DIAGNOSIS — I422 Other hypertrophic cardiomyopathy: Secondary | ICD-10-CM

## 2021-04-11 DIAGNOSIS — E876 Hypokalemia: Secondary | ICD-10-CM | POA: Diagnosis present

## 2021-04-11 DIAGNOSIS — I4891 Unspecified atrial fibrillation: Secondary | ICD-10-CM | POA: Diagnosis not present

## 2021-04-11 DIAGNOSIS — I421 Obstructive hypertrophic cardiomyopathy: Secondary | ICD-10-CM

## 2021-04-11 DIAGNOSIS — N179 Acute kidney failure, unspecified: Secondary | ICD-10-CM | POA: Diagnosis present

## 2021-04-11 LAB — CBC WITH DIFFERENTIAL/PLATELET
Abs Immature Granulocytes: 0.04 10*3/uL (ref 0.00–0.07)
Basophils Absolute: 0 10*3/uL (ref 0.0–0.1)
Basophils Relative: 0 %
Eosinophils Absolute: 0 10*3/uL (ref 0.0–0.5)
Eosinophils Relative: 0 %
HCT: 41.5 % (ref 39.0–52.0)
Hemoglobin: 14.4 g/dL (ref 13.0–17.0)
Immature Granulocytes: 0 %
Lymphocytes Relative: 12 %
Lymphs Abs: 1.3 10*3/uL (ref 0.7–4.0)
MCH: 32.7 pg (ref 26.0–34.0)
MCHC: 34.7 g/dL (ref 30.0–36.0)
MCV: 94.3 fL (ref 80.0–100.0)
Monocytes Absolute: 0.5 10*3/uL (ref 0.1–1.0)
Monocytes Relative: 5 %
Neutro Abs: 9 10*3/uL — ABNORMAL HIGH (ref 1.7–7.7)
Neutrophils Relative %: 83 %
Platelets: 312 10*3/uL (ref 150–400)
RBC: 4.4 MIL/uL (ref 4.22–5.81)
RDW: 12.6 % (ref 11.5–15.5)
WBC: 11 10*3/uL — ABNORMAL HIGH (ref 4.0–10.5)
nRBC: 0 % (ref 0.0–0.2)

## 2021-04-11 LAB — SARS CORONAVIRUS 2 (TAT 6-24 HRS): SARS Coronavirus 2: NEGATIVE

## 2021-04-11 LAB — COMPREHENSIVE METABOLIC PANEL
ALT: 21 U/L (ref 0–44)
AST: 19 U/L (ref 15–41)
Albumin: 3.8 g/dL (ref 3.5–5.0)
Alkaline Phosphatase: 74 U/L (ref 38–126)
Anion gap: 8 (ref 5–15)
BUN: 15 mg/dL (ref 6–20)
CO2: 23 mmol/L (ref 22–32)
Calcium: 8.8 mg/dL — ABNORMAL LOW (ref 8.9–10.3)
Chloride: 108 mmol/L (ref 98–111)
Creatinine, Ser: 1.38 mg/dL — ABNORMAL HIGH (ref 0.61–1.24)
GFR, Estimated: >60 mL/min (ref >60)
Glucose, Bld: 151 mg/dL — ABNORMAL HIGH (ref 70–99)
Potassium: 3.2 mmol/L — ABNORMAL LOW (ref 3.5–5.1)
Sodium: 139 mmol/L (ref 135–145)
Total Bilirubin: 0.7 mg/dL (ref 0.3–1.2)
Total Protein: 6.3 g/dL — ABNORMAL LOW (ref 6.5–8.1)

## 2021-04-11 LAB — MAGNESIUM: Magnesium: 1.9 mg/dL (ref 1.7–2.4)

## 2021-04-11 LAB — RAPID URINE DRUG SCREEN, HOSP PERFORMED
Amphetamines: NOT DETECTED
Barbiturates: NOT DETECTED
Benzodiazepines: NOT DETECTED
Cocaine: NOT DETECTED
Opiates: NOT DETECTED
Tetrahydrocannabinol: NOT DETECTED

## 2021-04-11 MED ORDER — HYDROCODONE-ACETAMINOPHEN 5-325 MG PO TABS: 1.0000 | ORAL_TABLET | ORAL | Status: DC | PRN

## 2021-04-11 MED ORDER — POTASSIUM CHLORIDE 10 MEQ/100ML IV SOLN
10.0000 meq | Freq: Once | INTRAVENOUS | Status: AC
  Administered 2021-04-11: 10 meq via INTRAVENOUS
  Filled 2021-04-11: qty 100

## 2021-04-11 MED ORDER — ACETAMINOPHEN 500 MG PO TABS: 1000.0000 mg | ORAL_TABLET | Freq: Four times a day (QID) | ORAL | Status: DC | PRN

## 2021-04-11 MED ORDER — ONDANSETRON HCL 4 MG/2ML IJ SOLN: 4.0000 mg | Freq: Four times a day (QID) | INTRAMUSCULAR | Status: DC | PRN

## 2021-04-11 MED ORDER — ONDANSETRON HCL 4 MG PO TABS: 4.0000 mg | ORAL_TABLET | Freq: Four times a day (QID) | ORAL | Status: DC | PRN

## 2021-04-11 MED ORDER — ETOMIDATE 2 MG/ML IV SOLN
INTRAVENOUS | Status: AC | PRN
  Administered 2021-04-11: 15 mg via INTRAVENOUS

## 2021-04-11 MED ORDER — AMIODARONE HCL 200 MG PO TABS: 200.0000 mg | ORAL_TABLET | Freq: Every day | ORAL | 6 refills | Status: DC

## 2021-04-11 MED ORDER — METOPROLOL TARTRATE 25 MG PO TABS
100.0000 mg | ORAL_TABLET | Freq: Two times a day (BID) | ORAL | Status: DC
  Administered 2021-04-11: 100 mg via ORAL
  Filled 2021-04-11: qty 4

## 2021-04-11 MED ORDER — TRAZODONE HCL 50 MG PO TABS: 25.0000 mg | ORAL_TABLET | Freq: Every evening | ORAL | Status: DC | PRN

## 2021-04-11 MED ORDER — APIXABAN 5 MG PO TABS
5.0000 mg | ORAL_TABLET | Freq: Two times a day (BID) | ORAL | Status: DC
  Administered 2021-04-11: 5 mg via ORAL
  Filled 2021-04-11: qty 1

## 2021-04-11 MED ORDER — ETOMIDATE 2 MG/ML IV SOLN
15.0000 mg | Freq: Once | INTRAVENOUS | Status: DC
  Filled 2021-04-11: qty 10

## 2021-04-11 MED ORDER — METOPROLOL TARTRATE 5 MG/5ML IV SOLN
5.0000 mg | Freq: Once | INTRAVENOUS | Status: AC
  Administered 2021-04-11: 5 mg via INTRAVENOUS
  Filled 2021-04-11: qty 5

## 2021-04-11 MED ORDER — SENNOSIDES-DOCUSATE SODIUM 8.6-50 MG PO TABS: 1.0000 | ORAL_TABLET | Freq: Every evening | ORAL | Status: DC | PRN

## 2021-04-11 MED ORDER — AMIODARONE HCL 200 MG PO TABS: 400.0000 mg | ORAL_TABLET | Freq: Two times a day (BID) | ORAL | 0 refills | Status: DC

## 2021-04-11 NOTE — ED Notes (Signed)
Ambulated pulse 96

## 2021-04-11 NOTE — Telephone Encounter (Signed)
Pt c/o medication issue:  1. Name of Medication:  amiodarone (PACERONE) 200 MG tablet  2. How are you currently taking this medication (dosage and times per day)?    3. Are you having a reaction (difficulty breathing--STAT)?    4. What is your medication issue?   Pharmacist with [facility] is requesting clarification on medication instructions. She is states they received 2 sets of instructions.

## 2021-04-11 NOTE — Discharge Summary (Addendum)
Physician Discharge Summary [facility]    Patient: Charles Wyatt                   Admit date: 04/10/2021   DOB: 01-Mar-1971             Discharge date:04/11/2021/2:56 PM [id number]:[id number]                          PCP: Sharlene Dory, DO  Disposition: HOME   Recommendations for Outpatient Follow-up:   . Follow up: Follow-up with cardiologist in 1-2 weeks (follow up with Dr. Graciela Husbands ) . continue metoprolol and eliquis; with cardiology EP in two weeks  . Follow-up with PCP 3-4 weeks  Discharge Condition: Stable   Code Status:   Code Status: Full Code  Diet recommendation: Cardiac diet   Discharge Diagnoses:    Principal Problem:   Atrial fibrillation with rapid ventricular response (HCC) Active Problems:   OBESITY   Hypertrophic cardiomyopathy (HCC)   Implantable cardioverter-defibrillator (ICD) in situ   Hypokalemia   Acute kidney injury (HCC)   Atrial fibrillation with RVR (HCC)   History of Present Illness/ Hospital Course Charline Bills Summary:   Charles Wyatt is a 50 y.o. male with medical history significant of HOCM, afib, VT, implantable cardiac defibrillator, history inappropriate shock, obesity presents to the emergency department chief complaint of palpitations shortness of breath.  Information is obtained from the patient.  He reports he was in his usual state of health yesterday while out having dinner and watching hockey game with friend he developed palpitations and shortness of breath.  He did indicate about 3 weeks ago he had a brief "manageable" episode he followed up with his cardiology and his medications were increased.  He reports compliance with his medications.  Chest pain headache dizziness syncope or near syncope.  He denies any nausea vomiting.  No recent fever chills cough abdominal pain constipation diarrhea.  No dysuria hematuria frequency or urgency.  He called 911 he was transported to the emergency department.  EMS provided with  diltiazem.  ED Course: EKG with atrial fib with RVR to rate 144.  Provided with 2 doses of Lopressor with no 1 provement.  IV fluids were initiated.  Cardiology recommended cardioversion patient agreed.  According to the emergency department note patient was successfully cardioverted after 3 attempts under conscious sedation.  Controlled EKG reveals frequent PACs/PVCs.  Patient ambulated to the bathroom heart rate increased briefly to greater than 100.  Cardiology recommended admission to medicine service and they will consult  Atrial fibrillation with rapid ventricular response.  Patient with a history of same as well as neuromyopathy with implantable device.  At the time of admission rate controlled.   EMS provided with diltiazem and emergency department provided with Lopressor was poor results.  Cardioverted in the emergency department.   Rate has been controlled ever since.  Of note chart review indicates was previously on amiodarone but has been off for the last couple of months.   Carrdiology recommended admission to medicine and they will consulted ... pt was seen and evaluated   -Patient was monitored in monitored bed..  No further events were noted, remained in normal sinus rhythm -Continue Metoprolol, Eliquis   -The patient was seen and evaluated by cardiology recommended continue Metoprolol at current dose, and  Eliquis.  Added amiodarone  Cardiology referring patient to cardiology EP, as an outpatient -Cardiology also recommended to follow-up as an outpatient for  possible outpatient echo/stress echo test  -follow up with Dr. Graciela HusbandsKlein  Hypokalemia.  Mild.  Potassium level 3.2.  Repleted in the emergency department. -Recheck this afternoon   Acute kidney injury.   Creatinine slightly elevated.  Likely related to decreased oral intake.  No history of same. -Consulted avoid any nephrotoxin including  -To follow and monitor as an outpatient with PCP   Atrial fibrillation.  History of  same.  Initial diagnosis 2014.  Audiology note indicates CHA2DS2Vasc is 1.  Home medications include Eliquis.  Amiodarone stopped November 2021. Back to normal sinus rhythm 2.  Cardiology has seen and evaluated the patient, recommending continue metoprolol and Eliquis  Implantable cardioverter/defibrillator (ICD).  Chart review indicates recent visit with cardiology.  At that time ICD intact.  Also interrogated on this visit with no issues. - septal Variant; unable to open 2018 echo in [place] for full gradient assessment - with MR,no  Apical Aneurysm - s/p ICD    Nutritional status:  The patient's BMI is: Body mass index is 37.59 kg/m. I agree with the assessment and plan as outlined  Code Status: full Family Communication: patient Disposition Plan: home Consults called: cardiologist    Discharge Instructions:   Discharge Instructions    Activity as tolerated - No restrictions   Complete by: As directed    Call MD for:   Complete by: As directed    Chest pain, palpitations   Call MD for:  difficulty breathing, headache or visual disturbances   Complete by: As directed    Call MD for:  persistant dizziness or light-headedness   Complete by: As directed    Diet - low sodium heart healthy   Complete by: As directed    Discharge instructions   Complete by: As directed    Urologist recommend to continue metoprolol and eliquis; and to see EP cardiologist in two weeks. Also to be evaluated for outpatient cardiac stress/ echo   Increase activity slowly   Complete by: As directed        Medication List    TAKE these medications   acetaminophen 500 MG tablet Commonly known as: TYLENOL Take 1,000 mg by mouth every 6 (six) hours as needed for mild pain or headache.   apixaban 5 MG Tabs tablet Commonly known as: Eliquis TAKE 1 TABLET BY MOUTH 2 (TWO) TIMES DAILY. PLEASE KEEP LAB APPT FOR FURTHER REFILLS. THANKS What changed:   how much to take  how to take this  when  to take this  additional instructions   metoprolol tartrate 100 MG tablet Commonly known as: LOPRESSOR Take 1 tablet (100 mg total) by mouth 2 (two) times daily.       No Known Allergies   Procedures /Studies:   CUP PACEART INCLINIC DEVICE CHECK  Result Date: 04/03/2021 ICD check in clinic. Normal device function. Thresholds and sensing consistent with previous device measurements. Impedance trends stable over time. No mode switches. NSVT episode EGMs reviewed with industry and look like 1:1 AT, no VT.  Histogram distribution appropriate for patient and level of activity. No changes made this session. Device programmed at appropriate safety margins. Device programmed to optimize intrinsic conduction. Estimated longevity _7.2 years_. Pt enrolled in remote follow-up. ICD check in clinic. Normal device function. Thresholds and sensing consistent with previous device measurements. Impedance trends stable over time. No mode switches. NSVT episode EGMs reviewed with industry and look like 1:1 AT, no VT.  Histogram distribution appropriate for patient and level of activity. No  changes made this session. Device programmed at appropriate safety margins. Device programmed to optimize intrinsic conduction. Estimated longevity _7.2 years_. Pt enrolled in remote follow-up. AF detection rate reduced to 150bpm   Subjective:   Patient was seen and examined 04/11/2021, 2:56 PM Patient stable today. No acute distress.  No issues overnight Stable for discharge.  Discharge Exam:    Vitals:   04/11/21 0950 04/11/21 1115 04/11/21 1118 04/11/21 1300  BP: 123/89 116/90  125/82  Pulse: 73 64  76  Resp:  10  16  Temp:   98.8 F (37.1 C)   TempSrc:   Oral   SpO2:  98%  98%  Weight:      Height:        General: Pt lying comfortably in bed & appears in no obvious distress. Cardiovascular: S1 & S2 heard, RRR, S1/S2 +. No murmurs, rubs, gallops or clicks. No JVD or pedal edema. Respiratory: Clear to  auscultation without wheezing, rhonchi or crackles. No increased work of breathing. Abdominal:  Non-distended, non-tender & soft. No organomegaly or masses appreciated. Normal bowel sounds heard. CNS: Alert and oriented. No focal deficits. Extremities: no edema, no cyanosis    The results of significant diagnostics from this hospitalization (including imaging, microbiology, ancillary and laboratory) are listed below for reference.      Microbiology:   Recent Results (from the past 240 hour(s))  SARS CORONAVIRUS 2 (TAT 6-24 HRS) Nasopharyngeal Nasopharyngeal Swab     Status: None   Collection Time: 04/11/21  4:33 AM   Specimen: Nasopharyngeal Swab  Result Value Ref Range Status   SARS Coronavirus 2 NEGATIVE NEGATIVE Final    Comment: (NOTE) SARS-CoV-2 target nucleic acids are NOT DETECTED.  The SARS-CoV-2 RNA is generally detectable in upper and lower respiratory specimens during the acute phase of infection. Negative results do not preclude SARS-CoV-2 infection, do not rule out co-infections with other pathogens, and should not be used as the sole basis for treatment or other patient management decisions. Negative results must be combined with clinical observations, patient history, and epidemiological information. The expected result is Negative.  Fact Sheet for Patients: HairSlick.no  Fact Sheet for Healthcare Providers: quierodirigir.com  This test is not yet approved or cleared by the [place] FDA and  has been authorized for detection and/or diagnosis of SARS-CoV-2 by FDA under an Emergency Use Authorization (EUA). This EUA will remain  in effect (meaning this test can be used) for the duration of the COVID-19 declaration under Se ction 564(b)(1) of the Act, 21 U.S.C. section 360bbb-3(b)(1), unless the authorization is terminated or revoked sooner.  Performed at [facility], [address],  [place], [place] [address]      Labs:   CBC: Recent Labs  Lab 04/10/21 2319  WBC 11.0*  NEUTROABS 9.0*  HGB 14.4  HCT 41.5  MCV 94.3  PLT 312   Basic Metabolic Panel: Recent Labs  Lab 04/10/21 2319  NA 139  K 3.2*  CL 108  CO2 23  GLUCOSE 151*  BUN 15  CREATININE 1.38*  CALCIUM 8.8*  MG 1.9   Liver Function Tests: Recent Labs  Lab 04/10/21 2319  AST 19  ALT 21  ALKPHOS 74  BILITOT 0.7  PROT 6.3*  ALBUMIN 3.8  Urinalysis    Component Value Date/Time   COLORURINE YELLOW 05/17/2010 2017   APPEARANCEUR CLEAR 05/17/2010 2017   LABSPEC 1.030 05/17/2010 2017   PHURINE 5.5 05/17/2010 2017   GLUCOSEU NEGATIVE 05/17/2010 2017  HGBUR NEGATIVE 05/17/2010 2017   BILIRUBINUR NEGATIVE 05/17/2010 2017   KETONESUR NEGATIVE 05/17/2010 2017   PROTEINUR NEGATIVE 05/17/2010 2017   UROBILINOGEN 0.2 05/17/2010 2017   NITRITE NEGATIVE 05/17/2010 2017   LEUKOCYTESUR  05/17/2010 2017    NEGATIVE MICROSCOPIC NOT DONE ON URINES WITH NEGATIVE PROTEIN, BLOOD, LEUKOCYTES, NITRITE, OR GLUCOSE <1000 mg/dL.         Time coordinating discharge: Over 45 minutes  SIGNED: Kendell Bane, MD, FACP, [facility]. [facility],  Please use [email or web address] to Page If 7PM-7AM, please contact night-coverage [email or web address] 04/11/2021, 2:56 PM

## 2021-04-11 NOTE — Telephone Encounter (Signed)
Call placed to [facility].  Clarified amio taper instructions.

## 2021-04-11 NOTE — H&P (Addendum)
History and Physical    Charles Wyatt NOM:[id number] DOB: 1971/10/07 DOA: 04/10/2021  Referring MD/NP/PA: carryover PCP: Sharlene Dory, DO ( Outpatient Specialists: Eden Emms (cardiology), Graciela Husbands (cardiology) Patient coming from: home  Chief Complaint: palpitations/sob  HPI: Charles Wyatt is a 50 y.o. male with medical history significant of HOCM, afib, VT, implantable cardiac defibrillator, history inappropriate shock, obesity presents to the emergency department chief complaint of palpitations shortness of breath.  Information is obtained from the patient.  He reports he was in his usual state of health yesterday while out having dinner and watching hockey game with friend he developed palpitations and shortness of breath.  He did indicate about 3 weeks ago he had a brief "manageable" episode he followed up with his cardiology and his medications were increased.  He reports compliance with his medications.  Chest pain headache dizziness syncope or near syncope.  He denies any nausea vomiting.  No recent fever chills cough abdominal pain constipation diarrhea.  No dysuria hematuria frequency or urgency.  He called 911 he was transported to the emergency department.  EMS provided with diltiazem.  ED Course: EKG with atrial fib with RVR to rate 144.  Provided with 2 doses of Lopressor with no 1 provement.  IV fluids were initiated.  Cardiology recommended cardioversion patient agreed.  According to the emergency department note patient was successfully cardioverted after 3 attempts under conscious sedation.  Controlled EKG reveals frequent PACs/PVCs.  Patient ambulated to the bathroom heart rate increased briefly to greater than 100.  Cardiology recommended admission to medicine service and they will consult  Review of Systems: As per HPI otherwise 10 point review of systems negative.    Past Medical History:  Diagnosis Date  . Biatrial enlargement   . HCM    06 19/2011 ?[facility] ?Tee to  R/o subaortic membrane   . Implantable cardiac defibrillator Medtronic   . 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 Persistent atrial fibrillation (HCC)    Inappro shock  . Snoring     Past Surgical History:  Procedure Laterality Date  . CARDIOVERSION N/A 08/20/2017   Procedure: CARDIOVERSION;  Surgeon: Quintella Reichert, MD;  Location: [facility] ENDOSCOPY;  Service: Cardiovascular;  Laterality: N/A;  . CARDIOVERSION N/A 08/24/2017   Procedure: CARDIOVERSION;  Surgeon: Jake Bathe, MD;  Location: [facility] ENDOSCOPY;  Service: Cardiovascular;  Laterality: N/A;  . Defibrillator implantation     Medtronic Protecta 314 DRG ICD implanted by [name] for primary prevention  . PPM GENERATOR CHANGEOUT N/A 11/29/2017   EVERA MRI XT DR ICD implanted by [name] for primary prevention  . TEE WITHOUT CARDIOVERSION N/A 08/20/2017   Procedure: TRANSESOPHAGEAL ECHOCARDIOGRAM (TEE);  Surgeon: Quintella Reichert, MD;  Location[facility] ENDOSCOPY;  Service: Cardiovascular;  Laterality: N/A;     reports that he has never smoked. He has never used smokeless tobacco. He reports current alcohol use. He reports that he does not use drugs.  No Known Allergies  Family History  Problem Relation Age of Onset  . Cardiomyopathy Other        hypertensive obstructive cardiomyopathy     Prior to Admission medications   Medication Sig Start Date End Date Taking? Authorizing Provider  acetaminophen (TYLENOL) 500 MG tablet Take 1,000 mg by mouth every 6 (six) hours as needed for mild pain or headache.   Yes [provider]  apixaban (ELIQUIS) 5 MG TABS tablet TAKE 1 TABLET BY MOUTH 2 (TWO) TIMES DAILY. PLEASE KEEP LAB APPT FOR FURTHER  REFILLS. THANKS Patient taking differently: Take 5 mg by mouth 2 (two) times daily. 12/05/20  Yes Wendall Stade, MD  metoprolol tartrate (LOPRESSOR) 100 MG tablet Take 1 tablet (100 mg total) by mouth 2 (two) times daily. 04/03/21  Yes Sheilah Pigeon, PA-C    Physical Exam: Vitals:    04/11/21 0640 04/11/21 0726 04/11/21 0739 04/11/21 0900  BP: 117/81 121/88 121/83 123/80  Pulse: 74 86 73 78  Resp: 14 20 18 15   Temp:   98.1 F (36.7 C)   TempSrc:   Oral   SpO2: 97% 99% 94% 95%  Weight:      Height:          Constitutional: NAD, calm, comfortable Vitals:   04/11/21 0640 04/11/21 0726 04/11/21 0739 04/11/21 0900  BP: 117/81 121/88 121/83 123/80  Pulse: 74 86 73 78  Resp: 14 20 18 15   Temp:   98.1 F (36.7 C)   TempSrc:   Oral   SpO2: 97% 99% 94% 95%  Weight:      Height:       Eyes: PERRL, lids and conjunctivae normal ENMT: Mucous membranes are moist. Posterior pharynx clear of any exudate or lesions.Normal dentition.  Neck: normal, supple, no masses, no thyromegaly Respiratory: clear to auscultation bilaterally, no wheezing, no crackles. Normal respiratory effort. No accessory muscle use.  Cardiovascular: Regular rate and rhythm, no murmurs / rubs / gallops. No extremity edema. 2+ pedal pulses. No carotid bruits.  Abdomen: Obese no tenderness, no masses palpated. No hepatosplenomegaly. Bowel sounds positive.  Musculoskeletal: no clubbing / cyanosis. No joint deformity upper and lower extremities. Good ROM, no contractures. Normal muscle tone.  Skin: no rashes, lesions, ulcers. No induration Neurologic: CN 2-12 grossly intact. Sensation intact, DTR normal. Strength 5/5 in all 4.  Psychiatric: Normal judgment and insight. Alert and oriented x 3. Normal mood.    Labs on Admission: I have personally reviewed following labs and imaging studies  CBC: Recent Labs  Lab 04/10/21 2319  WBC 11.0*  NEUTROABS 9.0*  HGB 14.4  HCT 41.5  MCV 94.3  PLT 312   Basic Metabolic Panel: Recent Labs  Lab 04/10/21 2319  NA 139  K 3.2*  CL 108  CO2 23  GLUCOSE 151*  BUN 15  CREATININE 1.38*  CALCIUM 8.8*  MG 1.9   GFR: Estimated Creatinine Clearance: 77 mL/min (A) (by C-G formula based on SCr of 1.38 mg/dL (H)). Liver Function Tests: Recent Labs  Lab  04/10/21 2319  AST 19  ALT 21  ALKPHOS 74  BILITOT 0.7  PROT 6.3*  ALBUMIN 3.8   No results for input(s): LIPASE, AMYLASE in the last 168 hours. No results for input(s): AMMONIA in the last 168 hours. Coagulation Profile: No results for input(s): INR, PROTIME in the last 168 hours. Cardiac Enzymes: No results for input(s): CKTOTAL, CKMB, CKMBINDEX, TROPONINI in the last 168 hours. BNP (last 3 results) No results for input(s): PROBNP in the last 8760 hours. HbA1C: No results for input(s): HGBA1C in the last 72 hours. CBG: No results for input(s): GLUCAP in the last 168 hours. Lipid Profile: No results for input(s): CHOL, HDL, LDLCALC, TRIG, CHOLHDL, LDLDIRECT in the last 72 hours. Thyroid Function Tests: No results for input(s): TSH, T4TOTAL, FREET4, T3FREE, THYROIDAB in the last 72 hours. Anemia Panel: No results for input(s): VITAMINB12, FOLATE, FERRITIN, TIBC, IRON, RETICCTPCT in the last 72 hours. Urine analysis:    Component Value Date/Time   COLORURINE YELLOW 05/17/2010 2017  APPEARANCEUR CLEAR 05/17/2010 2017   LABSPEC 1.030 05/17/2010 2017   PHURINE 5.5 05/17/2010 2017   GLUCOSEU NEGATIVE 05/17/2010 2017   HGBUR NEGATIVE 05/17/2010 2017   BILIRUBINUR NEGATIVE 05/17/2010 2017   KETONESUR NEGATIVE 05/17/2010 2017   PROTEINUR NEGATIVE 05/17/2010 2017   UROBILINOGEN 0.2 05/17/2010 2017   NITRITE NEGATIVE 05/17/2010 2017   LEUKOCYTESUR  05/17/2010 2017    NEGATIVE MICROSCOPIC NOT DONE ON URINES WITH NEGATIVE PROTEIN, BLOOD, LEUKOCYTES, NITRITE, OR GLUCOSE <1000 mg/dL.   Sepsis Labs: @LABRCNTIP (procalcitonin:4,lacticidven:4) )No results found for this or any previous visit (from the past 240 hour(s)).   Radiological Exams on Admission: No results found.  EKG: Independently reviewed.   Assessment/Plan Principal Problem:   Atrial fibrillation with rapid ventricular response (HCC) Active Problems:   Hypertrophic cardiomyopathy (HCC)   Implantable  cardioverter-defibrillator (ICD) in situ   Hypokalemia   OBESITY   Acute kidney injury (HCC)   Atrial fibrillation with RVR (HCC)   #1.Atrial fibrillation with rapid ventricular response.  Patient with a history of same as well as neuromyopathy with implantable device.  At the time of admission rate controlled.  EMS provided with diltiazem and emergency department provided with Lopressor was poor results.  Cardioverted in the emergency department.  Rate has been controlled ever since.  Of note chart review indicates was previously on amiodarone but has been off for the last couple of months.  Cardiology recommended admission to medicine and they will consult. -Admit to telemetry -Continue home meds -follow cardiology recommendations  Hypokalemia.  Mild.  Potassium level 3.2.  Repleted in the emergency department. -Recheck this afternoon  #3.  Acute kidney injury.  Creatinine slightly elevated.  Likely related to decreased oral intake.  No history of same. -Monitor intake and output -Hold nephrotoxins -Recheck in the morning  .4  Atrial fibrillation.  History of same.  Initial diagnosis 2014.  Audiology note indicates CHA2DS2Vasc is 1.  Home medications include Eliquis.  Amiodarone stopped November 2021. -continue home meds -monitor -see #1 -await cardiology recs  #5.  Implantable cardioverter/defibrillator (ICD).  Chart review indicates recent visit with cardiology.  At that time ICD intact.  Also interrogated on this visit with no issues.   DVT prophylaxis: Eliquis Code Status: full Family Communication: patient Disposition Plan: home Consults called: cardmaster Admission status: obs  BLACK,KAREN M NP [facility]   If 7PM-7AM, please contact night-coverage [email or web address] Password [email or web address]  04/11/2021, 9:21 AM   Addendum:  Attestation: The patient was seen and examined independently, all history, labs and work-up has been reviewed independently.  Plan of care care was  discussed with NP in detail. Agree with all current findings and plan of care.   Plan of care was also discussed with patient patient, he expresses understanding and agreement with current plan.  SIGNED: 04/13/2021, MD, FHM. [facility],  Pager (please use [email or web address] to page/text)  Please use Epic Secure Chat for non-urgent communication (7AM-7PM) If 7PM-7AM, please contact night-coverage [email or web address],  04/11/2021, 10:38 AM

## 2021-04-11 NOTE — ED Notes (Signed)
Patient having multifocal PVCs

## 2021-04-11 NOTE — Consult Note (Addendum)
Cardiology Consultation:   Patient ID: Charles Wyatt MRN: [id number]; DOB: October 11, 1971  Admit date: 04/10/2021 Date of Consult: 04/11/2021  PCP:  Sharlene Dory, DO   [facility] Providers Cardiologist:  Charles Haws, MD   {   Patient Profile:   Charles Wyatt is a 50 y.o. male with a hx of HCM, Afib, VT who is being seen 04/11/2021 for the evaluation of AFib w/RVR requested by Charles Wyatt.  Device information MDT dual chamber ICD implanted 08/20/2010, gen change 11/29/2017 + appropriate therapies VT 2017 (in setting of weight loss aid/stimulant and off his BB) + inappropriate shock for AFib w/RVR   AAD  2018 Amiodarone for AFib > d/Wyatt Nov 2021 given youn gage and potential side effects   AF history Diagnosed 2014  History of Present Illness:   Charles Wyatt last saw Dr. Eden Wyatt Nov 2021 via tele health, doing well, discussed and decided to stop amiodarone and follow for AF burden and re-refer to Charles Wyatt if recurrent for re-eval for ablation   He saw Charles Wyatt, last seen by him  02/03/21, was doing well, off amiodarone, mild volume OL, encouraged salt and fluid restriction.   He had an ER visit 02/22/21 with Wyatt/o palpitations, ICD interrogated with one NSVT 10beats only not felt to explain frequency of his palpitatins Discharged from the ER, EKG noting PACs  I saw him 04/03/21 with an up tick in palpitations, noted brief ATach episodes, no AF and no VT, his lopressor increased with plans to see Charles Wyatt for re-evaluation of AAD/management options, having previously been evaluated by Charles Wyatt back in 2019 and ultimately decided not to pursue PVI ablation.  He came to the ER yesterday after having been out for some pizza a had a drink felt onset of palpitations and eventually caused him to feel SOB and came in He was found in rapid AFib and cardioverted in the ER (requiring 3 shocks) Admitted for observation.   LABS K+ 3.2 replaced via ER/IM BUN/Creat 15/1.38 Mag  1.9 WBC 11.8H/H 14/41 Plts 312   Past Medical History:  Diagnosis Date   Biatrial enlargement    HCM    06 19/2011 ?[facility] ?Tee to R/o subaortic membrane    Implantable cardiac defibrillator Medtronic    Inappropriate shocks from ICD --AFib    Persistent atrial fibrillation (HCC)    Inappro shock   Snoring     Past Surgical History:  Procedure Laterality Date   CARDIOVERSION N/A 08/20/2017   Procedure: CARDIOVERSION;  Surgeon: Charles Reichert, MD;  Location[facility] ENDOSCOPY;  Service: Cardiovascular;  Laterality: N/A;   CARDIOVERSION N/A 08/24/2017   Procedure: CARDIOVERSION;  Surgeon: Charles Bathe, MD;  Location: [facility] ENDOSCOPY;  Service: Cardiovascular;  Laterality: N/A;   Defibrillator implantation     Medtronic Protecta 314 DRG ICD implanted by Dr Graciela Wyatt for primary prevention   PPM GENERATOR CHANGEOUT N/A 11/29/2017   EVERA MRI XT DR ICD implanted by Dr Graciela Wyatt for primary prevention   TEE WITHOUT CARDIOVERSION N/A 08/20/2017   Procedure: TRANSESOPHAGEAL ECHOCARDIOGRAM (TEE);  Surgeon: Charles Reichert, MD;  Location: [facility] ENDOSCOPY;  Service: Cardiovascular;  Laterality: N/A;     Home Medications:  Prior to Admission medications   Medication Sig Start Date End Date Taking? Authorizing Provider  acetaminophen (TYLENOL) 500 MG tablet Take 1,000 mg by mouth every 6 (six) hours as needed for mild pain or headache.   Yes [provider]  amiodarone (PACERONE) 200 MG tablet Take 2 tablets (400  mg total) by mouth 2 (two) times daily. START by  Taking 400mg  (2 tabs) twice daily for 2 weeks/  THEN reduce to 400mg  (2 tabs) daily for 2 weeks 04/11/21  Yes Charles PigeonUrsuy, Renee Lynn, PA-Wyatt  amiodarone (PACERONE) 200 MG tablet Take 1 tablet (200 mg total) by mouth daily. 05/10/21  Yes Charles PigeonUrsuy, Renee Lynn, PA-Wyatt  apixaban (ELIQUIS) 5 MG TABS tablet TAKE 1 TABLET BY MOUTH 2 (TWO) TIMES DAILY. PLEASE KEEP LAB APPT FOR FURTHER REFILLS. THANKS Patient taking differently: Take 5 mg by mouth 2 (two) times daily.  12/05/20  Yes Charles StadeNishan, Charles C, MD  metoprolol tartrate (LOPRESSOR) 100 MG tablet Take 1 tablet (100 mg total) by mouth 2 (two) times daily. 04/03/21  Yes Charles PigeonUrsuy, Renee Lynn, PA-Wyatt    Inpatient Medications: Scheduled Meds:  apixaban  5 mg Oral BID   metoprolol tartrate  100 mg Oral BID   Continuous Infusions:   PRN Meds: acetaminophen, HYDROcodone-acetaminophen, ondansetron **OR** ondansetron (ZOFRAN) IV, senna-docusate, traZODone  Allergies:   No Known Allergies  Social History:   Social History   Socioeconomic History   Marital status: Married    Spouse name: Not on file   Number of children: Not on file   Years of education: Not on file   Highest education level: Not on file  Occupational History   Not on file  Tobacco Use   Smoking status: Never Smoker   Smokeless tobacco: Never Used  Vaping Use   Vaping Use: Never used  Substance and Sexual Activity   Alcohol use: Yes    Comment: occasional    Drug use: No   Sexual activity: Not on file  Other Topics Concern   Not on file  Social History Narrative   Denies tobacco , acohol or illicit drug use . He works as a Investment banker, corporateservice technican on machinery. He lives with his girlfriend   No children   Social Determinants of Corporate investment bankerHealth   Financial Resource Strain: Not on file  Food Insecurity: Not on file  Transportation Needs: Not on file  Physical Activity: Not on file  Stress: Not on file  Social Connections: Not on file  Intimate Partner Violence: Not on file    Family History:   Family History  Problem Relation Age of Onset   Cardiomyopathy Other        hypertensive obstructive cardiomyopathy     ROS:  Please see the history of present illness.  All other ROS reviewed and negative.     Physical Exam/Data:   Vitals:   04/11/21 0950 04/11/21 1115 04/11/21 1118 04/11/21 1300  BP: 123/89 116/90  125/82  Pulse: 73 64  76  Resp:  10  16  Temp:   98.8 F (37.1 Wyatt)   TempSrc:   Oral   SpO2:  98%  98%  Weight:       Height:        Intake/Output Summary (Last 24 hours) at 04/11/2021 1448 Last data filed at 04/11/2021 0322 Gross per 24 hour  Intake 1100 ml  Output --  Net 1100 ml   Last 3 Weights 04/10/2021 04/03/2021 02/22/2021  Weight (lbs) 240 lb 246 lb 12.8 oz 255 lb  Weight (kg) 108.863 kg 111.948 kg 115.667 kg     Body mass index is 37.59 kg/m.  General:  Well nourished, well developed, in no acute distress HEENT: normal Lymph: no adenopathy Neck: no JVD Endocrine:  No thryomegaly Vascular: No carotid bruits Cardiac:  RRR; no murmurs, gallops or  rubs Lungs:  CTA b/l, no wheezing, rhonchi or rales  Abd: soft, nontender Ext: no edema Musculoskeletal:  No deformities Skin: warm and dry  Neuro:  no focal abnormalities noted Psych:  Normal affect   EKG:  The EKG was personally reviewed and demonstrates:   AFib 144bpm SR PACs 88bpm  Telemetry:  Telemetry was personally reviewed and demonstrates:   SR 70's, intermittently has PACs, rare NSVT 3-4 beats  Relevant CV Studies:  09/06/2017: TTE Study Conclusions  - Left ventricle: LVEF is approximately 50% with hypokinesis of the    base/mid inferior and inferoseptal walls. The cavity size was    normal. Wall thickness was increased in a pattern of severe LVH.  - Mitral valve: There was mild regurgitation.  - Left atrium: The atrium was moderately to severely dilated.  - Pericardium, extracardiac: A trivial pericardial effusion was    identified.   Laboratory Data:  High Sensitivity Troponin:  No results for input(s): TROPONINIHS in the last 720 hours.   Chemistry Recent Labs  Lab 04/10/21 2319  NA 139  K 3.2*  CL 108  CO2 23  GLUCOSE 151*  BUN 15  CREATININE 1.38*  CALCIUM 8.8*  GFRNONAA >60  ANIONGAP 8    Recent Labs  Lab 04/10/21 2319  PROT 6.3*  ALBUMIN 3.8  AST 19  ALT 21  ALKPHOS 74  BILITOT 0.7   Hematology Recent Labs  Lab 04/10/21 2319  WBC 11.0*  RBC 4.40  HGB 14.4  HCT 41.5  MCV 94.3  MCH 32.7   MCHC 34.7  RDW 12.6  PLT 312   BNPNo results for input(s): BNP, PROBNP in the last 168 hours.  DDimer No results for input(s): DDIMER in the last 168 hours.   Radiology/Studies:  No results found.   Assessment and Plan:   1. ICD     Intact function by his last interrogation 04/03/21        2. Paroxysmal Afib     CHA2DS2Vasc is one on Eliquis     Amiodarone stopped Nov 2021     Charles Wyatt had a lengthy discussion with the patient and his wife, options for AFib management.  Will plan to resume amiodarone for now and update his echo (ordered for out patient) and re-visit ablation  Pending his echo      3. Hx of VT/NSVT     3-4 beat NSVT rarely on tele   4. HCM     Exam does not suggest volume OL    OK to discharge from our perspective, I have ordered his echo for the office and he has Charles Wyatt follow up in place I have ordered amiodarone 400mg  BID for 2 weeks > 400mg  daily for 2 weeks > 200mg  daily          Risk Assessment/Risk Scores:  { For questions or updates, please contact [facility] Please consult [email or web address] for contact info under    Signed, , PA-Wyatt  04/11/2021 2:48 PM  Hypertrophic nonobstructive cardiomyopathy  Atrial fibrillation-persistent-recurrent  Ventricular tachycardia-recurrent  ICD-Medtronic-dual-chamber   The patient presents with recurrent atrial fibrillation in the wake of having his amiodarone discontinued.  Given his hypertrophic physiology, he tolerates it poorly and as such, would be inclined towards a more aggressive strategy for the prevention of atrial fibrillation especially given the data from his device showing an increasing burden albeit of short episodes of atrial fibrillation in the washing out phase following the discontinuation of his amiodarone in  the fall.  We discussed 2 different strategies, the first was amiodarone bridging to ablation and the other was dofetilide plus minus ablation.  His left  atrial size last measured a couple years ago was quite large greater than 60 mL/m and this will need to be reassessed.  Still, I lean towards ablation in this young man in the hopes of being able to modify the substrate to try to prevent more atrial fibrillation and the inexorable slide (almost certainly) from persistent towards permanent atrial fibrillation.  He is agreeable.  We will load him as noted above.  Arrange for repeat consultation with Dr. Fawn Kirk and appreciate consultation echocardiogram to assess left atrial size  Appreciate Dr. Thane Edu input

## 2021-04-11 NOTE — Consult Note (Addendum)
Cardiology Consultation:   Patient ID: Charles Wyatt MRN: [id number]; DOB: Aug 28, 1971  Admit date: 04/10/2021 Date of Consult: 04/11/2021  Primary Care Provider: Sharlene Dory, DO [facility] Cardiologist: [name], MD  [facility] Electrophysiologist:  None   Patient Profile:   Charles Wyatt is a 50 y.o. male with HCM, pAF, and MT ICD who presents with recurrent AF/RVR.   History of Present Illness:   Charles Wyatt experienced recurrence of his AF last night with feeling palpitations and shortness of breath after having dinner and a few drinks.  He took his increased dose metoprolol and Eliquis without any resolution of his palpitations.  His metoprolol was increased from metoprolol tartrate 50 mg p.o. twice daily to 100 mg p.o. twice daily last week's clinic visit.  On EMS arrival he was given diltiazem 30 mg IV without improvement in his rates.  He was then brought into the ED with AF/RVR in the 140s.  His blood pressure was otherwise normotensive and since he has been completely adherent to his Eliquis without any recent missed doses he was electively cardioverted 3 times (120-150-200J) with eventual restoration of normal sinus rhythm with frequent PACs/PVCs.  He was admitted for observation as he had 1 recurrence of AF post cardioversion along with ongoing frequent PACs.  Past Medical History:  Diagnosis Date  . Biatrial enlargement   . HCM    06 19/2011 ?[facility] ?Tee to R/o subaortic membrane   . Implantable cardiac defibrillator Medtronic   . 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 Persistent atrial fibrillation (HCC)    Inappro shock  . Snoring    Past Surgical History:  Procedure Laterality Date  . CARDIOVERSION N/A 08/20/2017   Procedure: CARDIOVERSION;  Surgeon: Quintella Reichert, MD;  Location: [facility] ENDOSCOPY;  Service: Cardiovascular;  Laterality: N/A;  . CARDIOVERSION N/A 08/24/2017   Procedure: CARDIOVERSION;  Surgeon: Jake Bathe, MD;  Location: [facility]  ENDOSCOPY;  Service: Cardiovascular;  Laterality: N/A;  . Defibrillator implantation     Medtronic Protecta 314 DRG ICD implanted by [name] for primary prevention  . PPM GENERATOR CHANGEOUT N/A 11/29/2017   EVERA MRI XT DR ICD implanted by [name] for primary prevention  . TEE WITHOUT CARDIOVERSION N/A 08/20/2017   Procedure: TRANSESOPHAGEAL ECHOCARDIOGRAM (TEE);  Surgeon: Quintella Reichert, MD;  Location[facility] ENDOSCOPY;  Service: Cardiovascular;  Laterality: N/A;    Home Medications:  Prior to Admission medications   Medication Sig Start Date End Date Taking? Authorizing Provider  acetaminophen (TYLENOL) 500 MG tablet Take 1,000 mg by mouth every 6 (six) hours as needed for mild pain or headache.   Yes [provider]  apixaban (ELIQUIS) 5 MG TABS tablet TAKE 1 TABLET BY MOUTH 2 (TWO) TIMES DAILY. PLEASE KEEP LAB APPT FOR FURTHER REFILLS. THANKS Patient taking differently: Take 5 mg by mouth 2 (two) times daily. 12/05/20  Yes Wendall Stade, MD  metoprolol tartrate (LOPRESSOR) 100 MG tablet Take 1 tablet (100 mg total) by mouth 2 (two) times daily. 04/03/21  Yes Sheilah Pigeon, PA-C    Inpatient Medications: Scheduled Meds: . apixaban  5 mg Oral BID  . metoprolol tartrate  100 mg Oral BID   Continuous Infusions:  PRN Meds: acetaminophen, HYDROcodone-acetaminophen, ondansetron **OR** ondansetron (ZOFRAN) IV, senna-docusate, traZODone  Allergies:   No Known Allergies  Social History:   Social History   Socioeconomic History  . Marital status: Married    Spouse name: Not on file  . Number of  children: Not on file  . Years of education: Not on file  . Highest education level: Not on file  Occupational History  . Not on file  Tobacco Use  . Smoking status: Never Smoker  . Smokeless tobacco: Never Used  Vaping Use  . Vaping Use: Never used  Substance and Sexual Activity  . Alcohol use: Yes    Comment: occasional   . Drug use: No  . Sexual activity: Not on file   Other Topics Concern  . Not on file  Social History Narrative   Denies tobacco , acohol or illicit drug use . 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 Cardiomyopathy Other        hypertensive obstructive cardiomyopathy    ROS:  Review of Systems: [y] = yes, [ ]  = no       General: Weight gain [ ] ; Weight loss [ ] ; Anorexia [ ] ; Fatigue [ ] ; Fever [ ] ; Chills [ ] ; Weakness [ ]     Cardiac: Chest pain/pressure [ ] ; Resting SOB [ ] ; Exertional SOB [ ] ; Orthopnea [ ] ; Pedal Edema [ ] ; Palpitations [y]; Syncope [ ] ; Presyncope [ ] ; Paroxysmal nocturnal dyspnea [ ]     Pulmonary: Cough [ ] ; Wheezing [ ] ; Hemoptysis [ ] ; Sputum [ ] ; Snoring [ ]   GI: Vomiting [ ] ; Dysphagia [ ] ; Melena [ ] ; Hematochezia [ ] ; Heartburn [ ] ; Abdominal pain [ ] ; Constipation [ ] ; Diarrhea [ ] ; BRBPR [ ]     GU: Hematuria [ ] ; Dysuria [ ] ; Nocturia [ ]   Vascular: Pain in legs with walking [ ] ; Pain in feet with lying flat [ ] ; Non-healing sores [ ] ; Stroke [ ] ; TIA [ ] ; Slurred speech [ ] ;    Neuro: Headaches [ ] ; Vertigo [ ] ; Seizures [ ] ; Paresthesias [ ] ;Blurred vision [ ] ; Diplopia [ ] ; Vision changes [ ]     Ortho/Skin: Arthritis [ ] ; Joint pain [ ] ; Muscle pain [ ] ; Joint swelling [ ] ; Back Pain [ ] ; Rash [ ]     Psych: Depression [ ] ; Anxiety [ ]     Heme: Bleeding problems [ ] ; Clotting disorders [ ] ; Anemia [ ]     Endocrine: Diabetes [ ] ; Thyroid dysfunction [ ]    Physical Exam/Data:   Vitals:   04/11/21 0640 04/11/21 0726 04/11/21 0739 04/11/21 0900  BP: 117/81 121/88 121/83 123/80  Pulse: 74 86 73 78  Resp:  14 20 18 15   Temp:   98.1 F (36.7 C)   TempSrc:   Oral   SpO2: 97% 99% 94% 95%  Weight:      Height:        Intake/Output Summary (Last 24 hours) at 04/11/2021 0936 Last data filed at 04/11/2021 0322 Gross per 24 hour  Intake 1100 ml  Output --  Net 1100 ml   Last 3 Weights 04/10/2021 04/03/2021 02/22/2021  Weight (lbs) 240 lb 246 lb 12.8 oz 255 lb  Weight (kg) 108.863 kg 111.948 kg 115.667 kg     Body mass index  is 37.59 kg/m.  General:  Well nourished, well developed, in no acute distress HEENT: normal Lymph: no adenopathy Neck: no JVD Endocrine:  No thryomegaly Vascular: No carotid bruits; FA pulses 2+ bilaterally without bruits  Cardiac: accelerated rate, irregular rhythm, RRR; no murmur  Lungs:  clear to auscultation bilaterally, no wheezing, rhonchi or rales  Abd: soft, nontender, no hepatomegaly  Ext: no edema Musculoskeletal:  No deformities, BUE and BLE strength normal and equal Skin: warm and dry  Neuro:  CNs 2-12 intact, no focal abnormalities noted Psych:  Normal affect   EKG:  The EKG was personally reviewed and demonstrates: AF/RVR, no ischemic changes Telemetry:  Telemetry was personally reviewed and demonstrates: AF/RVR  Relevant CV Studies:  TTE Result date: 09/06/17 - Left ventricle: LVEF is approximately 50% with hypokinesis of the  base/mid inferior and inferoseptal walls. The cavity size was  normal. Wall thickness was increased in a pattern of severe LVH.  - Mitral valve: There was mild regurgitation.  - Left atrium: The atrium was moderately to severely dilated.  - Pericardium, extracardiac: A trivial pericardial effusion was  identified.   Laboratory Data:  High Sensitivity Troponin:  No results for input(s): TROPONINIHS in the last 720 hours.   Chemistry Recent Labs  Lab 04/10/21 2319  NA 139  K 3.2*  CL 108  CO2 23  GLUCOSE 151*  BUN 15  CREATININE 1.38*  CALCIUM 8.8*  GFRNONAA >60  ANIONGAP 8    Recent Labs  Lab  04/10/21 2319  PROT 6.3*  ALBUMIN 3.8  AST 19  ALT 21  ALKPHOS 74  BILITOT 0.7   Hematology Recent Labs  Lab 04/10/21 2319  WBC 11.0*  RBC 4.40  HGB 14.4  HCT 41.5  MCV 94.3  MCH 32.7  MCHC 34.7  RDW 12.6  PLT 312   BNPNo results for input(s): BNP, PROBNP in the last 168 hours.  DDimer No results for input(s): DDIMER in the last 168 hours.  Radiology/Studies:  No results found.  Assessment and Plan:   Charles Wyatt is a 50 y.o. male with HCM, pAF, and MT ICD who presents with recurrent AF/RVR. He is currently on metop tartrate 100 mg PO bid which was increased at his last visit. He was on amio until 09/2020 which was d/c due to long term toxicity. He has been evaluated before for PVI however this was deferred given LAE (mod-severely dilated) and low AF burden. I think it would be reasonable to re visit attempted ablation for Charles Wyatt given his young age and poor AF control with rate control. I would rather consider ablation before committing him to long term amio again given his age. For now he is admitted for observation to ensure he doesn't go back into AF again given frequent PACs and brief runs of pAF post cardioversion. We will need to refer to EP for further discussion of risks/benefits of CA prior to discharge.   For questions or updates, please contact [facility] Please consult [email or web address] for contact info under   Signed, Linton RumpMatthew A Carlisle, MD  04/11/2021 9:36 AM  Personally seen and examined. Agree with APP above with the following comments: Briefly 50 yo M with history of HOCM, PAF and who presents with return of AF RVR Patient notes that this may have been mediated by a mixed drink that he had with friends (rarely drinks) but notes that his burden has increased off amio since 11/21 Exam notable for harsh systolic murmur that worsens with  hand grip, occasional irregular beat with PACs in the room Personally reviewed relevant tests; no further AF; EMS  strips and paper EKG in room showing AF Would recommend: continue metoprolol and eliquis; will see EP in two weeks   Hypertrophic Cardiomyopathy; with obstruction. - septal Variant; unable to open 2018 echo in [place] for full gradient assessment - with MR,no  Apical Aneurysm - s/p ICD - could be possible Dysopyramide candidate vs AF ablation if further non-triggered events - reasonable for outpatient echo/stress echo, though he has no exertional symptoms presently   Discussed with patient, wife, and primary MD Possible DC later today if no further AF  Riley Lam, MD Cardiologist [facility]  [address] [place], [address] [place], [place] [address] [phone]  1:37 PM

## 2021-04-11 NOTE — ED Notes (Signed)
Continues to remain in afib rvr, MD notified

## 2021-04-11 NOTE — Discharge Instructions (Signed)

## 2021-04-11 NOTE — ED Notes (Signed)
Patient aware of need for urine specimen.

## 2021-04-24 NOTE — Progress Notes (Signed)
Patient Care Team: Sharlene Dory, DO as PCP - General (Family Medicine) Wendall Stade, MD as PCP - Cardiology (Cardiology) Duke Salvia, MD as Consulting Physician (Cardiology)   HPI  Charles Wyatt is a 50 y.o. male seen in followup for an ICD originally implanted 2011 for HCM with GEN change 12/18 in the setting of a family history of sudden death and significant septal thickness and hyperenhancing on cardiac MRI. He has hx of inappropriate shock from AFib RVR remotely  On apixoban  Seen in ER 5/22 with atrial fibrillation tachy palpitations and was cardioverted and resumed on amiodarone    Not tolerating amiodarone as well. He developed some fatigue, weakness and anxiety since his last visit. He has been feeling more anxious than his baseline. Also some sensation of mild shortness of breath that last for 30 minutes.  The patient denies chest pain, nocturnal dyspnea, orthopnea, or peripheral edema.  There have been no palpitations, lightheadedness or syncope.    Sleeping only 4-5 hours at night.    11/05/16 following 3 ICD shocks. They were recorded by the fellow as appropriate. He declined admission  Review of these data however demonstrates that he had nonsustained ventricular tachycardia and shocks were delivered because of commitment following charging on shock 1 and with recurrent nonsustained ventricular tachycardia, subsequent shocks. This occurred in the context of having taken a new weight loss stimulants. He was also not taking his beta blocker.  His family history was reviewed. It seems that the gene runs through his mother family and a large kindred has been identified and is being followed in [place] and [place]   He was admitted 9/18 w recurrent AFib with RVR; he was started on amiodarone.  He saw Dr. Fawn Kirk to consider ablation; his low atrial fibrillation burden and left atrial size it was elected at that time to continue medical therapy      Some loss  of appetite.   Echo schedule on the 13 th of June, 2022.   Date Cr K TSH LFT Hgb  9/18   4.04     1/19  1.4 4.3 5.29 22 14.8   3/19   4.03    12/20 1.52 4.2 3.82 21   1/22 1.32 4.3   14.4  5/22 1.38 3.2   14.4   DATE TEST    9/17 Echo    EF 50-55 %   10/18 Echo    EF 50 % LVH/ LAE (5.8/2.6/67)  MR mile    Past Medical History:  Diagnosis Date  . Biatrial enlargement   . HCM    06 19/2011 ?[facility] ?Tee to R/o subaortic membrane   . Implantable cardiac defibrillator Medtronic   . 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 Persistent atrial fibrillation (HCC)    Inappro shock  . Snoring     Past Surgical History:  Procedure Laterality Date  . CARDIOVERSION N/A 08/20/2017   Procedure: CARDIOVERSION;  Surgeon: Quintella Reichert, MD;  Location: [facility] ENDOSCOPY;  Service: Cardiovascular;  Laterality: N/A;  . CARDIOVERSION N/A 08/24/2017   Procedure: CARDIOVERSION;  Surgeon: Jake Bathe, MD;  Location[facility] ENDOSCOPY;  Service: Cardiovascular;  Laterality: N/A;  . Defibrillator implantation     Medtronic Protecta 314 DRG ICD implanted by [name] for primary prevention  . PPM GENERATOR CHANGEOUT N/A 11/29/2017   EVERA MRI XT DR ICD implanted by [name] for primary prevention  . TEE WITHOUT CARDIOVERSION N/A 08/20/2017  Procedure: TRANSESOPHAGEAL ECHOCARDIOGRAM (TEE);  Surgeon: Quintella Reichert, MD;  Location: [facility] ENDOSCOPY;  Service: Cardiovascular;  Laterality: N/A;    Current Outpatient Medications  Medication Sig Dispense Refill  . acetaminophen (TYLENOL) 500 MG tablet Take 1,000 mg by mouth every 6 (six) hours as needed for mild pain or headache.    Melene Muller ON 05/10/2021] amiodarone (PACERONE) 200 MG tablet Take 1 tablet (200 mg total) by mouth daily. (Patient taking differently: Take 400 mg by mouth daily.) 30 tablet 6  . apixaban (ELIQUIS) 5 MG TABS tablet TAKE 1 TABLET BY MOUTH 2 (TWO) TIMES DAILY. PLEASE KEEP LAB APPT FOR FURTHER REFILLS. THANKS 60 tablet 0  . metoprolol tartrate  (LOPRESSOR) 100 MG tablet Take 1 tablet (100 mg total) by mouth 2 (two) times daily. 180 tablet 1   No current facility-administered medications for this visit.    No Known Allergies  Review of Systems negative except from HPI and PMH  Physical Exam BP 98/66   Pulse 64   Ht 5\' 7"  (1.702 m)   Wt 239 lb (108.4 kg)   SpO2 93%   BMI 37.43 kg/m  Well developed and well nourished in no acute distress HENT normal Neck supple with JVP-flat Clear Device pocket well healed; without hematoma or erythema.  There is no tethering  Regular rate and rhythm, no gallop No murmur Abd-soft with active BS No Clubbing cyanosis edema Skin-warm and dry Anious  & Oriented  Grossly normal sensory and motor function  ECG sinus at 64 Interval 21/12/46   Assessment and  Plan HCM  ICD Medtronic The patient's device was interrogated.  The information was reviewed. No changes were made in the programming.     Ventricular tachycardia-nonsustained  Obesity  High Risk Medication Surveillance amiodarone  Atrial fibrillation with a rapid ventricular response  Hypokalemia   Has been holding sinus rhythm on amiodarone since his cardioversion in the emergency room 5/22.  He is not tolerating amiodarone again as he did not last time; given his cardiomyopathy we do not have a lot of options but will try dronaderone.  We have also arranged for him to have a consultation with Dr. 6/22 to reconsider catheter ablation for his atrial fibrillation.  He has an echocardiogram scheduled I will assess left atrial size prior to that visit.  Intercurrent ventricular tachycardia  On anticoagulation with Eliquis.  No bleeding.  We will continue 5 mg daily.  Low K at ER   Can be contributing to arrhythmia -- will recheck   Anxiety has been a real issue.  Have taken liberty to prescribe lorazepam 0.5 m g I have also reached out to his primary care physician to ask him to assist with this and will follow up with the  patient  There was increasing ventricular pacing which may be contributing to some of his lightheadedness and vague sensations.  We have increased his AV delay from 250--300 ms    I,Alexis Bryant,acting as a scribe for Fawn Kirk, MD.,have documented all relevant documentation on the behalf of Sherryl Manges, MD,as directed by  Sherryl Manges, MD while in the presence of Sherryl Manges, MD.   I, Sherryl Manges, MD, have reviewed all documentation for this visit. The documentation on 04/25/21 for the exam, diagnosis, procedures, and orders are all accurate and complete.

## 2021-04-25 ENCOUNTER — Ambulatory Visit: Payer: Managed Care, Other (non HMO) | Admitting: Internal Medicine

## 2021-04-25 ENCOUNTER — Telehealth: Payer: Self-pay

## 2021-04-25 ENCOUNTER — Encounter: Payer: Self-pay | Admitting: Internal Medicine

## 2021-04-25 ENCOUNTER — Telehealth: Payer: Self-pay | Admitting: Internal Medicine

## 2021-04-25 ENCOUNTER — Other Ambulatory Visit: Payer: Self-pay

## 2021-04-25 VITALS — BP 98/66 | HR 64 | Ht 67.0 in | Wt 239.0 lb

## 2021-04-25 DIAGNOSIS — I422 Other hypertrophic cardiomyopathy: Secondary | ICD-10-CM

## 2021-04-25 DIAGNOSIS — I4729 Other ventricular tachycardia: Secondary | ICD-10-CM

## 2021-04-25 DIAGNOSIS — I4891 Unspecified atrial fibrillation: Secondary | ICD-10-CM | POA: Diagnosis not present

## 2021-04-25 DIAGNOSIS — I472 Ventricular tachycardia: Secondary | ICD-10-CM | POA: Diagnosis not present

## 2021-04-25 DIAGNOSIS — Z95810 Presence of automatic (implantable) cardiac defibrillator: Secondary | ICD-10-CM | POA: Diagnosis not present

## 2021-04-25 LAB — BASIC METABOLIC PANEL
BUN/Creatinine Ratio: 10 (ref 9–20)
BUN: 17 mg/dL (ref 6–24)
CO2: 22 mmol/L (ref 20–29)
Calcium: 9.6 mg/dL (ref 8.7–10.2)
Chloride: 103 mmol/L (ref 96–106)
Creatinine, Ser: 1.67 mg/dL — ABNORMAL HIGH (ref 0.76–1.27)
Glucose: 111 mg/dL — ABNORMAL HIGH (ref 65–99)
Potassium: 4.5 mmol/L (ref 3.5–5.2)
Sodium: 140 mmol/L (ref 134–144)
eGFR: 50 mL/min/{1.73_m2} — ABNORMAL LOW (ref >59)

## 2021-04-25 MED ORDER — MULTAQ 400 MG PO TABS: 400.0000 mg | ORAL_TABLET | Freq: Two times a day (BID) | ORAL | 11 refills | Status: DC

## 2021-04-25 MED ORDER — LORAZEPAM 0.5 MG PO TABS: 0.2500 mg | ORAL_TABLET | Freq: Every day | ORAL | 0 refills | Status: DC | PRN

## 2021-04-25 NOTE — Telephone Encounter (Signed)
Spoke with [facility] [place], [place] and provided verbal order for Lorazepam 0.5mg  - 1/2 tablet by mouth once daily prn anxiety #5 [phone] - Faxed hard copy to pharmacy above as requested.

## 2021-04-25 NOTE — Patient Instructions (Signed)
Medication Instructions:  Your physician has recommended you make the following change in your medication:   ** Stop Amiodarone  ** begin Multaq 400mg  - 1 tablet by mouth twice daily with a meal ** script provided for Ativan 0.5mg  - 1/2 tablet by mouth daily as needed for anxiety    *If you need a refill on your cardiac medications before your next appointment, please call your pharmacy*   Lab Work: BMET today  If you have labs (blood work) drawn today and your tests are completely normal, you will receive your results only by: MyChart Message (if you have MyChart) OR . A paper copy in the mail If you have any lab test that is abnormal or we need to change your treatment, we will call you to review the results.   Testing/Procedures: None ordered.   Follow-Up: At [facility], you and your health needs are our priority.  As part of our continuing mission to provide you with exceptional heart care, we have created designated Provider Care Teams.  These Care Teams include your primary Cardiologist (physician) and Advanced Practice Providers (APPs -  Physician Assistants and Nurse Practitioners) who all work together to provide you with the care you need, when you need it.  We recommend signing up for the patient portal called "MyChart".  Sign up information is provided on this After Visit Summary.  MyChart is used to connect with patients for Virtual Visits (Telemedicine).  Patients are able to view lab/test results, encounter notes, upcoming appointments, etc.  Non-urgent messages can be sent to your provider as well.   To learn more about what you can do with MyChart, go to [facility].    Your next appointment:   Appointment with Dr [email or web address] after June 13th [facility] will call you with appointment

## 2021-04-25 NOTE — Telephone Encounter (Signed)
Pt c/o medication issue:  1. Name of Medication: LORazepam (ATIVAN) 0.5 MG tablet  2. How are you currently taking this medication (dosage and times per day)? 1 half tablet daily as needed  3. Are you having a reaction (difficulty breathing--STAT)? no  4. What is your medication issue? [facility] [address] states the prescription was hand written and had no DEA number and no date of perscription. He would like it sent again with the information.

## 2021-04-25 NOTE — Telephone Encounter (Signed)
**Note De-Identified Charlcie Prisco Obfuscation** I started a Multaq PA through covermymeds. Key: [id number]

## 2021-05-05 ENCOUNTER — Ambulatory Visit: Payer: Managed Care, Other (non HMO) | Admitting: Family Medicine

## 2021-05-05 ENCOUNTER — Other Ambulatory Visit: Payer: Self-pay

## 2021-05-05 ENCOUNTER — Encounter: Payer: Self-pay | Admitting: Family Medicine

## 2021-05-05 VITALS — BP 108/70 | HR 102 | Temp 98.2°F | Ht 67.0 in | Wt 237.1 lb

## 2021-05-05 DIAGNOSIS — F411 Generalized anxiety disorder: Secondary | ICD-10-CM

## 2021-05-05 MED ORDER — PAROXETINE HCL 20 MG PO TABS: 20.0000 mg | ORAL_TABLET | Freq: Every day | ORAL | 2 refills | Status: AC

## 2021-05-05 MED ORDER — HYDROXYZINE PAMOATE 25 MG PO CAPS: 25.0000 mg | ORAL_CAPSULE | Freq: Three times a day (TID) | ORAL | 0 refills | Status: AC | PRN

## 2021-05-05 MED ORDER — CITALOPRAM HYDROBROMIDE 20 MG PO TABS: 20.0000 mg | ORAL_TABLET | Freq: Every day | ORAL | 3 refills | Status: DC

## 2021-05-05 NOTE — Addendum Note (Signed)
Addended by: Radene Gunning on: 05/05/2021 06:15 PM   Modules accepted: Orders

## 2021-05-05 NOTE — Patient Instructions (Addendum)
Please consider counseling. Contact [phone] to schedule an appointment or inquire about cost/insurance coverage.  Aim to do some physical exertion for 150 minutes per week. This is typically divided into 5 days per week, 30 minutes per day. The activity should be enough to get your heart rate up. Anything is better than nothing if you have time constraints.  Take 1/2 tab of the Celexa for the 1st two weeks.   Coping skills Choose 5 that work for you:  Take a deep breath  Count to 20  Read a book  Do a puzzle  Meditate  Bake  Sing  Knit  Garden  Pray  Go outside  Call a friend  Listen to music  Take a walk  Color  Send a note  Take a bath  Watch a movie  Be alone in a quiet place  Pet an animal  Visit a friend  Journal  Exercise  Stretch   Let us know if you need anything.

## 2021-05-05 NOTE — Progress Notes (Addendum)
Chief Complaint  Patient presents with  . Follow-up    Subjective Charles Wyatt is an 50 y.o. male who presents with anxiety.  He is here with his wife. Symptoms began 2 yrs ago, worse over past 2 mo. Anxiety symptoms: difficulty concentrating, insomnia, irritable, palpitations, racing thoughts, shortness of breath.  Depressive symptoms anhedonia,. Family history significant for no psychiatric illness. Social stressors include heart health He is currently being treated with None and has failed Ativan 0.25 mg rx'd by EP doc. He is not following with a psychologist.  Past Medical History:  Diagnosis Date  . Biatrial enlargement   . HCM    06 19/2011 ?[facility] ?Tee to R/o subaortic membrane   . Implantable cardiac defibrillator Medtronic   . 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 Persistent atrial fibrillation (HCC)    Inappro shock  . Snoring      Family History Family History  Problem Relation Age of Onset  . Cardiomyopathy Other        hypertensive obstructive cardiomyopathy    Exam BP 108/70 (BP Location: Left Arm, Patient Position: Sitting, Cuff Size: Normal)   Pulse (!) 102   Temp 98.2 F (36.8 C) (Oral)   Ht 5\' 7"  (1.702 m)   Wt 237 lb 2 oz (107.6 kg)   SpO2 94%   BMI 37.14 kg/m  General:  well developed, well nourished, in no apparent distress Lungs:  normal respiratory effort without accessory muscle use Psych: well oriented with normal range of affect and age-appropriate judgement/insight  Assessment and Plan  GAD (generalized anxiety disorder) - Plan: hydrOXYzine (VISTARIL) 25 MG capsule, PARoxetine (PAXIL) 20 MG tablet, DISCONTINUED: citalopram (CELEXA) 20 MG tablet  Chronic, uncontrolled. Stop Ativan. Start 1/2 dose of Paxil for 2 weeks and then increase to 20 mg/d. Vistaril prn.  Counseled on adjunctive treatment with exercise/physical activity. Number for [facility] provided in AVS. F/u in 6 weeks. Patient and his wife  voiced understanding and agreement to the plan.  [facility] [place], DO 05/05/21 6:14 PM

## 2021-05-06 ENCOUNTER — Inpatient Hospital Stay (HOSPITAL_COMMUNITY)
Admission: EM | Admit: 2021-05-06 | Discharge: 2021-05-09 | DRG: 291 | Disposition: A | Discharge status: Home / Self Care | Payer: Managed Care, Other (non HMO) | Attending: Internal Medicine | Admitting: Internal Medicine

## 2021-05-06 ENCOUNTER — Other Ambulatory Visit: Payer: Self-pay

## 2021-05-06 ENCOUNTER — Emergency Department (HOSPITAL_COMMUNITY): Payer: Managed Care, Other (non HMO)

## 2021-05-06 ENCOUNTER — Inpatient Hospital Stay (HOSPITAL_COMMUNITY): Payer: Managed Care, Other (non HMO)

## 2021-05-06 ENCOUNTER — Encounter (HOSPITAL_COMMUNITY): Payer: Self-pay

## 2021-05-06 DIAGNOSIS — G4733 Obstructive sleep apnea (adult) (pediatric): Secondary | ICD-10-CM | POA: Diagnosis present

## 2021-05-06 DIAGNOSIS — I340 Nonrheumatic mitral (valve) insufficiency: Secondary | ICD-10-CM | POA: Diagnosis present

## 2021-05-06 DIAGNOSIS — N189 Chronic kidney disease, unspecified: Secondary | ICD-10-CM

## 2021-05-06 DIAGNOSIS — Z6837 Body mass index (BMI) 37.0-37.9, adult: Secondary | ICD-10-CM

## 2021-05-06 DIAGNOSIS — F419 Anxiety disorder, unspecified: Secondary | ICD-10-CM | POA: Diagnosis present

## 2021-05-06 DIAGNOSIS — Z7901 Long term (current) use of anticoagulants: Secondary | ICD-10-CM

## 2021-05-06 DIAGNOSIS — R778 Other specified abnormalities of plasma proteins: Secondary | ICD-10-CM | POA: Diagnosis not present

## 2021-05-06 DIAGNOSIS — R7989 Other specified abnormal findings of blood chemistry: Secondary | ICD-10-CM | POA: Diagnosis present

## 2021-05-06 DIAGNOSIS — D75839 Thrombocytosis, unspecified: Secondary | ICD-10-CM | POA: Diagnosis present

## 2021-05-06 DIAGNOSIS — I248 Other forms of acute ischemic heart disease: Secondary | ICD-10-CM | POA: Diagnosis present

## 2021-05-06 DIAGNOSIS — Z95810 Presence of automatic (implantable) cardiac defibrillator: Secondary | ICD-10-CM | POA: Diagnosis present

## 2021-05-06 DIAGNOSIS — J810 Acute pulmonary edema: Secondary | ICD-10-CM

## 2021-05-06 DIAGNOSIS — Z8241 Family history of sudden cardiac death: Secondary | ICD-10-CM

## 2021-05-06 DIAGNOSIS — Z713 Dietary counseling and surveillance: Secondary | ICD-10-CM | POA: Diagnosis not present

## 2021-05-06 DIAGNOSIS — Z20822 Contact with and (suspected) exposure to covid-19: Secondary | ICD-10-CM | POA: Diagnosis present

## 2021-05-06 DIAGNOSIS — D631 Anemia in chronic kidney disease: Secondary | ICD-10-CM | POA: Diagnosis present

## 2021-05-06 DIAGNOSIS — I421 Obstructive hypertrophic cardiomyopathy: Secondary | ICD-10-CM | POA: Diagnosis not present

## 2021-05-06 DIAGNOSIS — N179 Acute kidney failure, unspecified: Secondary | ICD-10-CM | POA: Diagnosis present

## 2021-05-06 DIAGNOSIS — I130 Hypertensive heart and chronic kidney disease with heart failure and stage 1 through stage 4 chronic kidney disease, or unspecified chronic kidney disease: Principal | ICD-10-CM | POA: Diagnosis present

## 2021-05-06 DIAGNOSIS — E669 Obesity, unspecified: Secondary | ICD-10-CM | POA: Diagnosis present

## 2021-05-06 DIAGNOSIS — R0609 Other forms of dyspnea: Secondary | ICD-10-CM

## 2021-05-06 DIAGNOSIS — I5021 Acute systolic (congestive) heart failure: Secondary | ICD-10-CM | POA: Diagnosis present

## 2021-05-06 DIAGNOSIS — I5033 Acute on chronic diastolic (congestive) heart failure: Secondary | ICD-10-CM | POA: Diagnosis present

## 2021-05-06 DIAGNOSIS — N182 Chronic kidney disease, stage 2 (mild): Secondary | ICD-10-CM | POA: Diagnosis present

## 2021-05-06 DIAGNOSIS — Z8616 Personal history of COVID-19: Secondary | ICD-10-CM | POA: Diagnosis not present

## 2021-05-06 DIAGNOSIS — I422 Other hypertrophic cardiomyopathy: Secondary | ICD-10-CM

## 2021-05-06 DIAGNOSIS — I480 Paroxysmal atrial fibrillation: Secondary | ICD-10-CM | POA: Diagnosis present

## 2021-05-06 DIAGNOSIS — R739 Hyperglycemia, unspecified: Secondary | ICD-10-CM | POA: Diagnosis present

## 2021-05-06 DIAGNOSIS — Z79899 Other long term (current) drug therapy: Secondary | ICD-10-CM | POA: Diagnosis not present

## 2021-05-06 DIAGNOSIS — D72829 Elevated white blood cell count, unspecified: Secondary | ICD-10-CM | POA: Diagnosis present

## 2021-05-06 DIAGNOSIS — I509 Heart failure, unspecified: Secondary | ICD-10-CM

## 2021-05-06 DIAGNOSIS — J9601 Acute respiratory failure with hypoxia: Secondary | ICD-10-CM | POA: Diagnosis present

## 2021-05-06 DIAGNOSIS — I4819 Other persistent atrial fibrillation: Secondary | ICD-10-CM | POA: Diagnosis present

## 2021-05-06 LAB — BASIC METABOLIC PANEL
Anion gap: 13 (ref 5–15)
BUN: 22 mg/dL — ABNORMAL HIGH (ref 6–20)
CO2: 19 mmol/L — ABNORMAL LOW (ref 22–32)
Calcium: 8.6 mg/dL — ABNORMAL LOW (ref 8.9–10.3)
Chloride: 104 mmol/L (ref 98–111)
Creatinine, Ser: 1.8 mg/dL — ABNORMAL HIGH (ref 0.61–1.24)
GFR, Estimated: 46 mL/min — ABNORMAL LOW (ref >60)
Glucose, Bld: 295 mg/dL — ABNORMAL HIGH (ref 70–99)
Potassium: 4.2 mmol/L (ref 3.5–5.1)
Sodium: 136 mmol/L (ref 135–145)

## 2021-05-06 LAB — ECHOCARDIOGRAM COMPLETE
Area-P 1/2: 6.02 cm2
Height: 67 in
MV M vel: 3.95 m/s
MV Peak grad: 62.3 mmHg
Radius: 1.13 cm
S' Lateral: 2.4 cm
Weight: 3792 oz

## 2021-05-06 LAB — CBC WITH DIFFERENTIAL/PLATELET
Abs Immature Granulocytes: 0.1 10*3/uL — ABNORMAL HIGH (ref 0.00–0.07)
Basophils Absolute: 0.1 10*3/uL (ref 0.0–0.1)
Basophils Relative: 1 %
Eosinophils Absolute: 0.2 10*3/uL (ref 0.0–0.5)
Eosinophils Relative: 1 %
HCT: 48.3 % (ref 39.0–52.0)
Hemoglobin: 15.6 g/dL (ref 13.0–17.0)
Immature Granulocytes: 1 %
Lymphocytes Relative: 22 %
Lymphs Abs: 2.8 10*3/uL (ref 0.7–4.0)
MCH: 32.2 pg (ref 26.0–34.0)
MCHC: 32.3 g/dL (ref 30.0–36.0)
MCV: 99.6 fL (ref 80.0–100.0)
Monocytes Absolute: 0.8 10*3/uL (ref 0.1–1.0)
Monocytes Relative: 6 %
Neutro Abs: 9.1 10*3/uL — ABNORMAL HIGH (ref 1.7–7.7)
Neutrophils Relative %: 69 %
Platelets: 611 10*3/uL — ABNORMAL HIGH (ref 150–400)
RBC: 4.85 MIL/uL (ref 4.22–5.81)
RDW: 12.8 % (ref 11.5–15.5)
WBC: 13.1 10*3/uL — ABNORMAL HIGH (ref 4.0–10.5)
nRBC: 0 % (ref 0.0–0.2)

## 2021-05-06 LAB — RESP PANEL BY RT-PCR (FLU A&B, COVID) ARPGX2
Influenza A by PCR: NEGATIVE
Influenza B by PCR: NEGATIVE
SARS Coronavirus 2 by RT PCR: NEGATIVE

## 2021-05-06 LAB — HIV ANTIBODY (ROUTINE TESTING W REFLEX): HIV Screen 4th Generation wRfx: NONREACTIVE

## 2021-05-06 LAB — CBG MONITORING, ED: Glucose-Capillary: 96 mg/dL (ref 70–99)

## 2021-05-06 LAB — TROPONIN I (HIGH SENSITIVITY)
Troponin I (High Sensitivity): 22 ng/L — ABNORMAL HIGH (ref <18)
Troponin I (High Sensitivity): 29 ng/L — ABNORMAL HIGH (ref <18)

## 2021-05-06 LAB — BRAIN NATRIURETIC PEPTIDE: B Natriuretic Peptide: 940.4 pg/mL — ABNORMAL HIGH (ref 0.0–100.0)

## 2021-05-06 LAB — TSH: TSH: 0.196 u[IU]/mL — ABNORMAL LOW (ref 0.350–4.500)

## 2021-05-06 MED ORDER — FUROSEMIDE 10 MG/ML IJ SOLN
20.0000 mg | Freq: Once | INTRAMUSCULAR | Status: AC
  Administered 2021-05-06: 20 mg via INTRAVENOUS
  Filled 2021-05-06: qty 2

## 2021-05-06 MED ORDER — SODIUM CHLORIDE 0.9 % IV SOLN: 250.0000 mL | INTRAVENOUS | Status: DC | PRN

## 2021-05-06 MED ORDER — SODIUM CHLORIDE 0.9% FLUSH
3.0000 mL | Freq: Two times a day (BID) | INTRAVENOUS | Status: DC
  Administered 2021-05-07 – 2021-05-08 (×3): 3 mL via INTRAVENOUS

## 2021-05-06 MED ORDER — ONDANSETRON HCL 4 MG/2ML IJ SOLN
4.0000 mg | Freq: Four times a day (QID) | INTRAMUSCULAR | Status: DC | PRN
Start: 2021-05-06 — End: 2021-05-09

## 2021-05-06 MED ORDER — PAROXETINE HCL 20 MG PO TABS
20.0000 mg | ORAL_TABLET | Freq: Every day | ORAL | Status: DC
  Administered 2021-05-06 – 2021-05-09 (×4): 20 mg via ORAL
  Filled 2021-05-06 (×4): qty 1

## 2021-05-06 MED ORDER — METOPROLOL TARTRATE 50 MG PO TABS
50.0000 mg | ORAL_TABLET | Freq: Two times a day (BID) | ORAL | Status: DC
  Administered 2021-05-06 – 2021-05-09 (×7): 50 mg via ORAL
  Filled 2021-05-06: qty 2
  Filled 2021-05-06 (×6): qty 1

## 2021-05-06 MED ORDER — APIXABAN 5 MG PO TABS
5.0000 mg | ORAL_TABLET | Freq: Two times a day (BID) | ORAL | Status: DC
  Administered 2021-05-06 (×2): 5 mg via ORAL
  Filled 2021-05-06 (×2): qty 1

## 2021-05-06 MED ORDER — ENOXAPARIN SODIUM 40 MG/0.4ML IJ SOSY
40.0000 mg | PREFILLED_SYRINGE | INTRAMUSCULAR | Status: DC
  Filled 2021-05-06: qty 0.4

## 2021-05-06 MED ORDER — SODIUM CHLORIDE 0.9% FLUSH: 3.0000 mL | INTRAVENOUS | Status: DC | PRN

## 2021-05-06 MED ORDER — SODIUM CHLORIDE 0.9% FLUSH
3.0000 mL | Freq: Two times a day (BID) | INTRAVENOUS | Status: DC
  Administered 2021-05-07 (×3): 3 mL via INTRAVENOUS

## 2021-05-06 MED ORDER — DRONEDARONE HCL 400 MG PO TABS
400.0000 mg | ORAL_TABLET | Freq: Two times a day (BID) | ORAL | Status: DC
  Filled 2021-05-06: qty 1

## 2021-05-06 MED ORDER — FUROSEMIDE 10 MG/ML IJ SOLN: 20.0000 mg | Freq: Two times a day (BID) | INTRAMUSCULAR | Status: DC

## 2021-05-06 MED ORDER — ACETAMINOPHEN 325 MG PO TABS
650.0000 mg | ORAL_TABLET | ORAL | Status: DC | PRN
  Administered 2021-05-06: 650 mg via ORAL
  Filled 2021-05-06: qty 2

## 2021-05-06 MED ORDER — LORAZEPAM 0.5 MG PO TABS: 0.2500 mg | ORAL_TABLET | Freq: Every day | ORAL | Status: DC | PRN

## 2021-05-06 MED ORDER — HYDROXYZINE PAMOATE 25 MG PO CAPS
25.0000 mg | ORAL_CAPSULE | Freq: Three times a day (TID) | ORAL | Status: DC | PRN
  Filled 2021-05-06 (×2): qty 3

## 2021-05-06 MED ORDER — FUROSEMIDE 10 MG/ML IJ SOLN
40.0000 mg | Freq: Two times a day (BID) | INTRAMUSCULAR | Status: DC
  Administered 2021-05-06 – 2021-05-08 (×5): 40 mg via INTRAVENOUS
  Filled 2021-05-06 (×5): qty 4

## 2021-05-06 MED ORDER — INSULIN ASPART 100 UNIT/ML IJ SOLN: 0.0000 [IU] | Freq: Four times a day (QID) | INTRAMUSCULAR | Status: DC

## 2021-05-06 NOTE — ED Notes (Signed)
Pt ambulated to the bathroom and back to room at 4LPM [place] with no issues. Tolerated well. Ambulated on steady gait. Denies shortness of breath. Currently lowered to 2LPM with 94-95% on the monitor. Dr.Smith notified.

## 2021-05-06 NOTE — ED Provider Notes (Signed)
[facility] EMERGENCY DEPARTMENT Provider Note   CSN: [id number] Arrival date & time: 05/06/21  7989     History Chief Complaint  Patient presents with  . Respiratory Distress    Charles Wyatt is a 50 y.o. male.  50 yo M with a chief complaints of sudden onset shortness of breath.  This started this morning when he woke up.  Found to be hypoxic with EMS.  He was placed on CPAP with some symptomatic improvement but with continued hypoxia.  Is coughing quite a bit with CPAP.  Denies cough yesterday or before.  Feels some chest tightness.  No history of acute pulmonary edema.  Mild hypertension with EMS given 2 nitros with some improvement.  No lower extremity edema no fevers.  The history is provided by the patient.  Illness Severity:  Severe Onset quality:  Sudden Duration:  30 minutes Timing:  Constant Progression:  Partially resolved Chronicity:  New Associated symptoms: cough and shortness of breath   Associated symptoms: no abdominal pain, no chest pain, no congestion, no diarrhea, no fever, no headaches, no myalgias, no rash and no vomiting        Past Medical History:  Diagnosis Date  . Biatrial enlargement   . HCM    06 19/2011 ?[facility] ?Tee to R/o subaortic membrane   . Implantable cardiac defibrillator Medtronic   . 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 Persistent atrial fibrillation (HCC)    Inappro shock  . Snoring     Patient Active Problem List   Diagnosis Date Noted  . Acute respiratory failure with hypoxia (HCC) 05/06/2021  . Thrombocytosis 05/06/2021  . Acute systolic CHF (congestive heart failure) (HCC) 05/06/2021  . Leukocytosis 05/06/2021  . Elevated troponin 05/06/2021  . Atrial fibrillation with rapid ventricular response (HCC) 04/11/2021  . Hypokalemia 04/11/2021  . Acute kidney injury superimposed on CKD (HCC) 04/11/2021  . Atrial fibrillation with RVR (HCC) 04/11/2021  . NSVT (nonsustained ventricular tachycardia) (HCC)  02/02/2021  . Atrial fibrillation (HCC)   . Shortness of breath   . PAF (paroxysmal atrial fibrillation) (HCC) 05/19/2013  . 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  . OBESITY 05/20/2010  . Hypertrophic cardiomyopathy (HCC) 05/20/2010  . ANEMIA 05/19/2010  . MURMUR 05/19/2010    Past Surgical History:  Procedure Laterality Date  . CARDIOVERSION N/A 08/20/2017   Procedure: CARDIOVERSION;  Surgeon: Quintella Reichert, MD;  Location: [facility] ENDOSCOPY;  Service: Cardiovascular;  Laterality: N/A;  . CARDIOVERSION N/A 08/24/2017   Procedure: CARDIOVERSION;  Surgeon: Jake Bathe, MD;  Location: [facility] ENDOSCOPY;  Service: Cardiovascular;  Laterality: N/A;  . Defibrillator implantation     Medtronic Protecta 314 DRG ICD implanted by [name] for primary prevention  . PPM GENERATOR CHANGEOUT N/A 11/29/2017   EVERA MRI XT DR ICD implanted by [name] for primary prevention  . TEE WITHOUT CARDIOVERSION N/A 08/20/2017   Procedure: TRANSESOPHAGEAL ECHOCARDIOGRAM (TEE);  Surgeon: Quintella Reichert, MD;  Location: [facility] ENDOSCOPY;  Service: Cardiovascular;  Laterality: N/A;       Family History  Problem Relation Age of Onset  . Cardiomyopathy Other        hypertensive obstructive cardiomyopathy    Social History   Tobacco Use  . Smoking status: Never Smoker  . Smokeless tobacco: Never Used  Vaping Use  . Vaping Use: Never used  Substance Use Topics  . Alcohol use: Yes    Comment: occasional   . Drug use: No  Home Medications Prior to Admission medications   Medication Sig Start Date End Date Taking? Authorizing Provider  acetaminophen (TYLENOL) 500 MG tablet Take 500-1,000 mg by mouth every 6 (six) hours as needed for mild pain or headache.   Yes [provider]  apixaban (ELIQUIS) 5 MG TABS tablet TAKE 1 TABLET BY MOUTH 2 (TWO) TIMES DAILY. PLEASE KEEP LAB APPT FOR FURTHER REFILLS. THANKS Patient taking differently: Take 5 mg by mouth 2 (two) times daily.  12/05/20  Yes Wendall Stade, MD  dronedarone (MULTAQ) 400 MG tablet Take 1 tablet (400 mg total) by mouth 2 (two) times daily with a meal. 04/25/21  Yes Duke Salvia, MD  hydrOXYzine (VISTARIL) 25 MG capsule Take 1-3 capsules (25-75 mg total) by mouth every 8 (eight) hours as needed for anxiety. 05/05/21  Yes Sharlene Dory, DO  LORazepam (ATIVAN) 0.5 MG tablet Take 0.5 tablets (0.25 mg total) by mouth daily as needed for anxiety. 04/25/21  Yes Duke Salvia, MD  metoprolol tartrate (LOPRESSOR) 50 MG tablet Take 50 mg by mouth 2 (two) times daily with a meal.   Yes [provider]  PARoxetine (PAXIL) 20 MG tablet Take 1 tablet (20 mg total) by mouth daily. Patient not taking: No sig reported 05/05/21   Sharlene Dory, DO    Allergies    Patient has no known allergies.  Review of Systems   Review of Systems  Constitutional: Negative for chills and fever.  HENT: Negative for congestion and facial swelling.   Eyes: Negative for discharge and visual disturbance.  Respiratory: Positive for cough, chest tightness and shortness of breath.   Cardiovascular: Negative for chest pain and palpitations.  Gastrointestinal: Negative for abdominal pain, diarrhea and vomiting.  Musculoskeletal: Negative for arthralgias and myalgias.  Skin: Negative for color change and rash.  Neurological: Negative for tremors, syncope and headaches.  Psychiatric/Behavioral: Negative for confusion and dysphoric mood.    Physical Exam Updated Vital Signs BP (!) 122/91 (BP Location: Right Arm)   Pulse 99   Temp 98.2 F (36.8 C) (Oral)   Resp 18   Ht 5\' 7"  (1.702 m)   Wt 103 kg   SpO2 93%   BMI 35.55 kg/m   Physical Exam Vitals and nursing note reviewed.  Constitutional:      Appearance: He is well-developed.  HENT:     Head: Normocephalic and atraumatic.  Eyes:     Pupils: Pupils are equal, round, and reactive to light.  Neck:     Vascular: No JVD.  Cardiovascular:     Rate  and Rhythm: Normal rate and regular rhythm.     Heart sounds: No murmur heard. No friction rub. No gallop.   Pulmonary:     Effort: No respiratory distress.     Breath sounds: No wheezing.     Comments: Diminished breath sounds in the bases with rales up to the clavicles. Abdominal:     General: There is no distension.     Tenderness: There is no abdominal tenderness. There is no guarding or rebound.  Musculoskeletal:        General: Normal range of motion.     Cervical back: Normal range of motion and neck supple.  Skin:    Coloration: Skin is not pale.     Findings: No rash.  Neurological:     Mental Status: He is alert and oriented to person, place, and time.  Psychiatric:        Behavior: Behavior  normal.     ED Results / Procedures / Treatments   Labs (all labs ordered are listed, but only abnormal results are displayed) Labs Reviewed  CBC WITH DIFFERENTIAL/PLATELET - Abnormal; Notable for the following components:      Result Value   WBC 13.1 (*)    Platelets 611 (*)    Neutro Abs 9.1 (*)    Abs Immature Granulocytes 0.10 (*)    All other components within normal limits  BASIC METABOLIC PANEL - Abnormal; Notable for the following components:   CO2 19 (*)    Glucose, Bld 295 (*)    BUN 22 (*)    Creatinine, Ser 1.80 (*)    Calcium 8.6 (*)    GFR, Estimated 46 (*)    All other components within normal limits  BRAIN NATRIURETIC PEPTIDE - Abnormal; Notable for the following components:   B Natriuretic Peptide 940.4 (*)    All other components within normal limits  TSH - Abnormal; Notable for the following components:   TSH 0.196 (*)    All other components within normal limits  HEMOGLOBIN A1C - Abnormal; Notable for the following components:   Hgb A1c MFr Bld 5.9 (*)    All other components within normal limits  BASIC METABOLIC PANEL - Abnormal; Notable for the following components:   Glucose, Bld 132 (*)    BUN 21 (*)    Creatinine, Ser 1.66 (*)    GFR,  Estimated 51 (*)    All other components within normal limits  CBC - Abnormal; Notable for the following components:   Platelets 441 (*)    All other components within normal limits  TROPONIN I (HIGH SENSITIVITY) - Abnormal; Notable for the following components:   Troponin I (High Sensitivity) 22 (*)    All other components within normal limits  TROPONIN I (HIGH SENSITIVITY) - Abnormal; Notable for the following components:   Troponin I (High Sensitivity) 29 (*)    All other components within normal limits  RESP PANEL BY RT-PCR (FLU A&B, COVID) ARPGX2  HIV ANTIBODY (ROUTINE TESTING W REFLEX)  CBG MONITORING, ED    EKG EKG Interpretation  Date/Time:  Tuesday May 06 2021 06:07:22 EDT Ventricular Rate:  87 PR Interval:  239 QRS Duration: 123 QT Interval:  404 QTC Calculation: 486 R Axis:   -2 Text Interpretation: Sinus rhythm Prolonged PR interval Consider left atrial enlargement Left bundle branch block with QRS widening Otherwise no significant change Confirmed by Melene Plan [phone]) on 05/06/2021 6:14:04 AM   Radiology DG Chest Port 1 View  Result Date: 05/06/2021 CLINICAL DATA:  Respiratory distress and shortness of breath EXAM: PORTABLE CHEST 1 VIEW COMPARISON:  01/01/2018 FINDINGS: Dual-chamber defibrillator leads from the right. Chronic cardiomegaly. Confluent airspace disease with septal thickening. No definite pleural effusion. IMPRESSION: CHF. Electronically Signed   By: Marnee Spring M.D.   On: 05/06/2021 06:23   ECHOCARDIOGRAM COMPLETE  Result Date: 05/06/2021    ECHOCARDIOGRAM REPORT   Patient Name:   SAVIAN MAZON Date of Exam: 05/06/2021 Medical Rec #:  [id number]       Height:       67.0 in Accession #:    [id number]      Weight:       237.0 lb Date of Birth:  08-21-1971        BSA:          2.174 m Patient Age:    49 years        BP:  102/78 mmHg Patient Gender: M               HR:           79 bpm. Exam Location:  Inpatient Procedure: 2D Echo, 3D Echo, Cardiac  Doppler, Color Doppler and Strain Analysis STAT ECHO Indications:    Dyspnea R06.00  History:        Patient has prior history of Echocardiogram examinations, most                 recent 09/06/2017. CHF, Defibrillator, Arrythmias:Atrial                 Fibrillation; Risk Factors:Sleep Apnea. HOCM.  Sonographer:    Leta Jungling RDCS Referring Phys: [phone] HAO MENG IMPRESSIONS  1. Left ventricular ejection fraction, by estimation, is 55 to 60%. The left ventricle has normal function. The left ventricle has no regional wall motion abnormalities. There is moderate asymmetric left ventricular hypertrophy of the basal-septal segment. Left ventricular diastolic parameters are consistent with Grade III diastolic dysfunction (restrictive). Elevated left atrial pressure.  2. Right ventricular systolic function is normal. The right ventricular size is normal. There is moderately elevated pulmonary artery systolic pressure.  3. Left atrial size was severely dilated.  4. Severe mitral valve regurgitation. No evidence of mitral stenosis. There is no evidence of systolic anterior motion of the mitral leaflets. There appears to be leaflet malcoaptation. There is no LV outflow obstruction. Effective regurgitant area is 0.6 cm, regurgitant volume 57 ml, regurgitant fraction 60%. The MR jet has a triangular, "v-wave cutoff" configuration. Pulmonary venous flow shows systolic flow reversal.  5. The aortic valve is tricuspid. Aortic valve regurgitation is not visualized. No aortic stenosis is present. FINDINGS  Left Ventricle: Left ventricular ejection fraction, by estimation, is 55 to 60%. The left ventricle has normal function. The left ventricle has no regional wall motion abnormalities. The left ventricular internal cavity size was normal in size. There is  moderate asymmetric left ventricular hypertrophy of the basal-septal segment. Left ventricular diastolic parameters are consistent with Grade III diastolic dysfunction  (restrictive). Elevated left atrial pressure. Right Ventricle: The right ventricular size is normal. No increase in right ventricular wall thickness. Right ventricular systolic function is normal. There is moderately elevated pulmonary artery systolic pressure. The tricuspid regurgitant velocity is 3.09 m/s, and with an assumed right atrial pressure of 15 mmHg, the estimated right ventricular systolic pressure is 53.2 mmHg. Left Atrium: Left atrial size was severely dilated. Right Atrium: Right atrial size was normal in size. Pericardium: There is no evidence of pericardial effusion. Mitral Valve: There is no evidence of systolic anterior motion of the mitral leaflets. There appears to be leaflet malcoaptation. There is no LV outflow obstruction. Effective regurgitant area is 0.6 cm, regurgitant volume 57 ml, regurgitant fraction 60%. The MR jet has a triangular, "v-wave cutoff" configuration. The mitral valve is abnormal. There is mild thickening of the mitral valve leaflet(s). Severe mitral valve regurgitation, with eccentric posteriorly directed jet. No evidence of mitral valve stenosis. Mitral valve area by planimetry is 8.04 cm, and by the pressure half-time method is calculated to be 6.02 cm. The mean transmitral gradient is 1.0 mmHg. MV peak gradient, 6.7 mmHg. The mean mitral valve gradient is 1.0 mmHg. Pulmonary venous flow shows systolic flow reversal. Tricuspid Valve: The tricuspid valve is normal in structure. Tricuspid valve regurgitation is trivial. Aortic Valve: The aortic valve is tricuspid. Aortic valve regurgitation is not visualized. No aortic stenosis is present.  Pulmonic Valve: The pulmonic valve was normal in structure. Pulmonic valve regurgitation is trivial. Aorta: The aortic root and ascending aorta are structurally normal, with no evidence of dilitation. IAS/Shunts: No atrial level shunt detected by color flow Doppler. Additional Comments: A device lead is visualized in the right  ventricle.  LEFT VENTRICLE PLAX 2D LVIDd:         4.20 cm LVIDs:         2.40 cm LV PW:         1.20 cm LV IVS:        1.90 cm LVOT diam:     2.00 cm LV SV:         40 LV SV Index:   18 LVOT Area:     3.14 cm  RIGHT VENTRICLE RV S prime:     13.70 cm/s TAPSE (M-mode): 1.4 cm LEFT ATRIUM              Index       RIGHT ATRIUM           Index LA diam:        6.80 cm  3.13 cm/m  RA Area:     18.60 cm LA Vol (A2C):   191.0 ml 87.88 ml/m RA Volume:   44.80 ml  20.61 ml/m LA Vol (A4C):   190.0 ml 87.42 ml/m LA Biplane Vol: 193.0 ml 88.80 ml/m  AORTIC VALVE LVOT Vmax:   88.65 cm/s LVOT Vmean:  59.950 cm/s LVOT VTI:    0.128 m  AORTA Ao Root diam: 2.95 cm MITRAL VALVE                 TRICUSPID VALVE MV Area (PHT):  6.02 cm     TR Peak grad:   38.2 mmHg MV Area (plan): 8.04 cm     TR Vmax:        309.00 cm/s MV Peak grad:   6.7 mmHg MV Mean grad:   1.0 mmHg     SHUNTS MV Vmax:        1.29 m/s     Systemic VTI:  0.13 m MV Vmean:       51.5 cm/s    Systemic Diam: 2.00 cm MV Decel Time:  126 msec MR Peak grad:    62.3 mmHg MR Mean grad:    33.5 mmHg MR Vmax:         394.50 cm/s MR Vmean:        269.0 cm/s MR PISA:         8.07 cm MR PISA Eff ROA: 60 mm MR PISA Radius:  1.2 cm MV E velocity: 127.33 cm/s Mihai Croitoru MD Electronically signed by Thurmon FairMihai Croitoru MD Signature Date/Time: 05/06/2021/2:34:34 PM    Final     Procedures Procedures   Medications Ordered in ED Medications  sodium chloride flush (NS) 0.9 % injection 3 mL (3 mLs Intravenous Given 05/07/21 0943)  sodium chloride flush (NS) 0.9 % injection 3 mL (has no administration in time range)  0.9 %  sodium chloride infusion (has no administration in time range)  acetaminophen (TYLENOL) tablet 650 mg (650 mg Oral Given 05/06/21 1541)  ondansetron (ZOFRAN) injection 4 mg (has no administration in time range)  metoprolol tartrate (LOPRESSOR) tablet 50 mg (50 mg Oral Given 05/07/21 0941)  LORazepam (ATIVAN) tablet 0.25 mg (has no administration in time  range)  hydrOXYzine (VISTARIL) capsule 25-75 mg (has no administration in time range)  furosemide (LASIX) injection 40 mg (  40 mg Intravenous Given 05/07/21 0941)  sodium chloride flush (NS) 0.9 % injection 3 mL (3 mLs Intravenous Given 05/07/21 0943)  PARoxetine (PAXIL) tablet 20 mg (20 mg Oral Given 05/07/21 0941)  apixaban (ELIQUIS) tablet 5 mg (has no administration in time range)  amiodarone (NEXTERONE) 1.8 mg/mL load via infusion 150 mg (150 mg Intravenous Bolus from Bag 05/07/21 1249)    Followed by  amiodarone (NEXTERONE PREMIX) 360-4.14 MG/200ML-% (1.8 mg/mL) IV infusion (60 mg/hr Intravenous New Bag/Given 05/07/21 1248)    Followed by  amiodarone (NEXTERONE PREMIX) 360-4.14 MG/200ML-% (1.8 mg/mL) IV infusion (has no administration in time range)  furosemide (LASIX) injection 20 mg (20 mg Intravenous Given 05/06/21 0633)  apixaban (ELIQUIS) tablet 5 mg (5 mg Oral Given 05/07/21 0941)    ED Course  I have reviewed the triage vital signs and the nursing notes.  Pertinent labs & imaging results that were available during my care of the patient were reviewed by me and considered in my medical decision making (see chart for details).    MDM Rules/Calculators/A&P                          50 yo M with a cc of sudden onset shortness of breath.  Woke him up this morning.  Patient presentation concerning for acute pulmonary edema.  Not significantly hypertensive though does have rales diffusely.  Has a history of hypertrophic cardiomyopathy with an implanted defibrillator.  Last echo was in 2018 with an EF of 50%.  Chest x-ray viewed by me with a pulmonary edema.  His blood pressure is already improved with 2 doses of nitro with EMS and BiPAP.  We will give a bolus dose of Lasix.  CRITICAL CARE Performed by: Rae Roam   Total critical care time: 35 minutes  Critical care time was exclusive of separately billable procedures and treating other patients.  Critical care was necessary to  treat or prevent imminent or life-threatening deterioration.  Critical care was time spent personally by me on the following activities: development of treatment plan with patient and/or surrogate as well as nursing, discussions with consultants, evaluation of patient's response to treatment, examination of patient, obtaining history from patient or surrogate, ordering and performing treatments and interventions, ordering and review of laboratory studies, ordering and review of radiographic studies, pulse oximetry and re-evaluation of patient's condition.  The patients results and plan were reviewed and discussed.   Any x-rays performed were independently reviewed by myself.   Differential diagnosis were considered with the presenting HPI.  Medications  sodium chloride flush (NS) 0.9 % injection 3 mL (3 mLs Intravenous Given 05/07/21 0943)  sodium chloride flush (NS) 0.9 % injection 3 mL (has no administration in time range)  0.9 %  sodium chloride infusion (has no administration in time range)  acetaminophen (TYLENOL) tablet 650 mg (650 mg Oral Given 05/06/21 1541)  ondansetron (ZOFRAN) injection 4 mg (has no administration in time range)  metoprolol tartrate (LOPRESSOR) tablet 50 mg (50 mg Oral Given 05/07/21 0941)  LORazepam (ATIVAN) tablet 0.25 mg (has no administration in time range)  hydrOXYzine (VISTARIL) capsule 25-75 mg (has no administration in time range)  furosemide (LASIX) injection 40 mg (40 mg Intravenous Given 05/07/21 0941)  sodium chloride flush (NS) 0.9 % injection 3 mL (3 mLs Intravenous Given 05/07/21 0943)  PARoxetine (PAXIL) tablet 20 mg (20 mg Oral Given 05/07/21 0941)  apixaban (ELIQUIS) tablet 5 mg (has no administration in  time range)  amiodarone (NEXTERONE) 1.8 mg/mL load via infusion 150 mg (150 mg Intravenous Bolus from Bag 05/07/21 1249)    Followed by  amiodarone (NEXTERONE PREMIX) 360-4.14 MG/200ML-% (1.8 mg/mL) IV infusion (60 mg/hr Intravenous New Bag/Given 05/07/21 1248)     Followed by  amiodarone (NEXTERONE PREMIX) 360-4.14 MG/200ML-% (1.8 mg/mL) IV infusion (has no administration in time range)  furosemide (LASIX) injection 20 mg (20 mg Intravenous Given 05/06/21 0633)  apixaban (ELIQUIS) tablet 5 mg (5 mg Oral Given 05/07/21 0941)    Vitals:   05/07/21 0520 05/07/21 0817 05/07/21 0941 05/07/21 1209  BP: 109/73 113/74  (!) 122/91  Pulse: 60 61 64 99  Resp:  13  18  Temp: 98.6 F (37 C) 98.6 F (37 C)  98.2 F (36.8 C)  TempSrc: Oral Oral  Oral  SpO2: 94% 95%  93%  Weight: 103 kg     Height:        Final diagnoses:  Flash pulmonary edema (HCC)  Acute congestive heart failure, unspecified heart failure type [facility])    Admission/ observation were discussed with the admitting physician, patient and/or family and they are comfortable with the plan.   Final Clinical Impression(s) / ED Diagnoses Final diagnoses:  Flash pulmonary edema (HCC)  Acute congestive heart failure, unspecified heart failure type [facility])    Rx / DC Orders ED Discharge Orders    None       Melene Plan, DO 05/07/21 1457

## 2021-05-06 NOTE — ED Notes (Signed)
Pt is asking for his home meds. Dr.Smith sent a secured chat for orders. Waiting for response.

## 2021-05-06 NOTE — ED Notes (Signed)
Spouse at bedside

## 2021-05-06 NOTE — H&P (Addendum)
History and Physical    Charles Wyatt QXI:[id number] DOB: 01/27/1971 DOA: 05/06/2021  Referring MD/NP/PA: Gwyneth Sprout, MD PCP: Sharlene Dory, DO  Consultants: Graciela Husbands, MD- cardiology Patient coming from: Home via EMS  Chief Complaint: Shortness of breath  I have personally briefly reviewed patient's old medical records in [facility]   HPI: Charles Wyatt is a 50 y.o. male with medical history significant of hypertrophic cardiomyopathy s/p AICD, paroxysmal atrial fibrillation on anticoagulation, OSA on CPAP, and obesity who presents with complaints of acute shortness of breath.  Patient reports that he was woken up out of his sleep this morning around 4 AM with difficulty breathing and was coughing uncontrollably.  Noted to be coughing up blood tinged sputum.  He could hear crackling and gurgling as he was breathing.  Over the last month he has had at least 2 episodes similar episodes with coughing lasting approximately 20 to 30 minutes, but noted to be less severe.   Patient had been found to be in atrial fibrillation and was cardioverted on 5/13 and started back on amiodarone.  However, patient reported feeling very bad after being placed on the medication and was switched to Multaq on 5/27 after his follow-up visit with Dr. Graciela Husbands.  Patient reports that he has not had any significant fever, chest pain, orthopnea, nausea, vomiting, or dysuria.  Other associated symptoms include diarrhea and weight loss of approximately 14 pounds over the last month with dietary changes.  He had been seen yesterday by his primary care provider due to his anxiety worsening and him being unable to sleep at night.   EMS found the patient have O2 saturations of 88% on room air and he was placed on nonrebreather and switched to CPAP.  He was given 2 sublingual nitroglycerin and was noted to be in atrial fibrillation.  ED Course: Upon admission into the emergency department patient was seen to be a  pulse 88-1 02, respiration 20-28, blood pressure 94/75-130 7/81, and O2 saturations as low as 88% with improvement on BiPAP 99-100%.  Labs significant for WBC 13.1, platelets 611, BUN 22, creatinine 1.8, glucose 295, BNP 940.4, and high-sensitivity troponin 22.  Chest x-ray was consistent with congestive heart failure.  COVID-19 and influenza screening were both negative.  Patient was given 20 mg of Lasix IV.  TRH called to admit.  Review of Systems  Constitutional: Positive for weight loss. Negative for fever.  HENT: Negative for hearing loss.   Eyes: Negative for discharge and redness.  Respiratory: Positive for cough, hemoptysis, sputum production and shortness of breath.   Cardiovascular: Positive for palpitations. Negative for chest pain, orthopnea and leg swelling.  Gastrointestinal: Positive for diarrhea. Negative for abdominal pain, nausea and vomiting.  Genitourinary: Negative for dysuria and hematuria.  Musculoskeletal: Negative for falls.  Neurological: Negative for focal weakness and loss of consciousness.  Psychiatric/Behavioral: Negative for substance abuse. The patient is nervous/anxious and has insomnia.     Past Medical History:  Diagnosis Date  . Biatrial enlargement   . HCM    06 19/2011 ?[facility] ?Tee to R/o subaortic membrane   . Implantable cardiac defibrillator Medtronic   . 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 Persistent atrial fibrillation (HCC)    Inappro shock  . Snoring     Past Surgical History:  Procedure Laterality Date  . CARDIOVERSION N/A 08/20/2017   Procedure: CARDIOVERSION;  Surgeon: Quintella Reichert, MD;  Location: [facility] ENDOSCOPY;  Service: Cardiovascular;  Laterality: N/A;  . CARDIOVERSION  N/A 08/24/2017   Procedure: CARDIOVERSION;  Surgeon: Jake BatheSkains, Mark C, MD;  [facility];  Service: Cardiovascular;  Laterality: N/A;  . Defibrillator implantation     Medtronic Protecta 314 DRG ICD implanted by Dr Graciela HusbandsKlein for primary prevention  . PPM  GENERATOR CHANGEOUT N/A 11/29/2017   EVERA MRI XT DR ICD implanted by Dr Graciela HusbandsKlein for primary prevention  . TEE WITHOUT CARDIOVERSION N/A 08/20/2017   Procedure: TRANSESOPHAGEAL ECHOCARDIOGRAM (TEE);  Surgeon: Quintella Reicherturner, Traci R, MD;  [facility];  Service: Cardiovascular;  Laterality: N/A;     reports that he has never smoked. He has never used smokeless tobacco. He reports current alcohol use. He reports that he does not use drugs.  No Known Allergies  Family History  Problem Relation Age of Onset  . Cardiomyopathy Other        hypertensive obstructive cardiomyopathy    Prior to Admission medications   Medication Sig Start Date End Date Taking? Authorizing Provider  acetaminophen (TYLENOL) 500 MG tablet Take 1,000 mg by mouth every 6 (six) hours as needed for mild pain or headache.    [provider]  apixaban (ELIQUIS) 5 MG TABS tablet TAKE 1 TABLET BY MOUTH 2 (TWO) TIMES DAILY. PLEASE KEEP LAB APPT FOR FURTHER REFILLS. THANKS 12/05/20   Wendall StadeNishan, Peter C, MD  dronedarone (MULTAQ) 400 MG tablet Take 1 tablet (400 mg total) by mouth 2 (two) times daily with a meal. 04/25/21   Duke SalviaKlein, Steven C, MD  hydrOXYzine (VISTARIL) 25 MG capsule Take 1-3 capsules (25-75 mg total) by mouth every 8 (eight) hours as needed for anxiety. 05/05/21   Sharlene DoryWendling, Nicholas Paul, DO  LORazepam (ATIVAN) 0.5 MG tablet Take 0.5 tablets (0.25 mg total) by mouth daily as needed for anxiety. 04/25/21   Duke SalviaKlein, Steven C, MD  metoprolol tartrate (LOPRESSOR) 100 MG tablet Take 1 tablet (100 mg total) by mouth 2 (two) times daily. 04/03/21   Sheilah PigeonUrsuy, Renee Lynn, PA-C  PARoxetine (PAXIL) 20 MG tablet Take 1 tablet (20 mg total) by mouth daily. 05/05/21   Sharlene DoryWendling, Nicholas Paul, DO    Physical Exam:  Constitutional: Middle-age male who appears to be in no acute distress Vitals:   05/06/21 0730 05/06/21 0745 05/06/21 0759 05/06/21 0828  BP: (!) 109/58 101/63 94/75   Pulse: 66 63 66   Resp: (!) 28 (!) 28 (!) 22   Temp:     97.8 F (36.6 C)  TempSrc:    Axillary  SpO2: 100% 100% 100%   Weight:      Height:       Eyes: PERRL, lids and conjunctivae normal ENMT: Mucous membranes are moist. Posterior pharynx clear of any exudate or lesions.  Neck: normal, supple, no masses, no thyromegaly Respiratory: Normal respiratory effort currently with some intermittent crackles appreciated in mild wheeze.  No significant rhonchi.  Patient able to talk in complete sentences and currently on 3 L nasal cannula oxygen. Cardiovascular: Irregular rhythm with positive murmur.. No extremity edema. 2+ pedal pulses. No carotid bruits.  Abdomen: no tenderness, no masses palpated. No hepatosplenomegaly. Bowel sounds positive.  Musculoskeletal: no clubbing / cyanosis. No joint deformity upper and lower extremities. Good ROM, no contractures. Normal muscle tone.  Skin: no rashes, lesions, ulcers. No induration Neurologic: CN 2-12 grossly intact. Sensation intact, DTR normal. Strength 5/5 in all 4.  Psychiatric: Normal judgment and insight. Alert and oriented x 3.  Anxious mood.     Labs on Admission: I have personally reviewed following labs and  imaging studies  CBC: Recent Labs  Lab 05/06/21 0607  WBC 13.1*  NEUTROABS 9.1*  HGB 15.6  HCT 48.3  MCV 99.6  PLT 611*   Basic Metabolic Panel: Recent Labs  Lab 05/06/21 0607  NA 136  K 4.2  CL 104  CO2 19*  GLUCOSE 295*  BUN 22*  CREATININE 1.80*  CALCIUM 8.6*   GFR: Estimated Creatinine Clearance: 58.7 mL/min (A) (by C-G formula based on SCr of 1.8 mg/dL (H)). Liver Function Tests: No results for input(s): AST, ALT, ALKPHOS, BILITOT, PROT, ALBUMIN in the last 168 hours. No results for input(s): LIPASE, AMYLASE in the last 168 hours. No results for input(s): AMMONIA in the last 168 hours. Coagulation Profile: No results for input(s): INR, PROTIME in the last 168 hours. Cardiac Enzymes: No results for input(s): CKTOTAL, CKMB, CKMBINDEX, TROPONINI in the last 168  hours. BNP (last 3 results) No results for input(s): PROBNP in the last 8760 hours. HbA1C: No results for input(s): HGBA1C in the last 72 hours. CBG: No results for input(s): GLUCAP in the last 168 hours. Lipid Profile: No results for input(s): CHOL, HDL, LDLCALC, TRIG, CHOLHDL, LDLDIRECT in the last 72 hours. Thyroid Function Tests: No results for input(s): TSH, T4TOTAL, FREET4, T3FREE, THYROIDAB in the last 72 hours. Anemia Panel: No results for input(s): VITAMINB12, FOLATE, FERRITIN, TIBC, IRON, RETICCTPCT in the last 72 hours. Urine analysis:    Component Value Date/Time   COLORURINE YELLOW 05/17/2010 2017   APPEARANCEUR CLEAR 05/17/2010 2017   LABSPEC 1.030 05/17/2010 2017   PHURINE 5.5 05/17/2010 2017   GLUCOSEU NEGATIVE 05/17/2010 2017   HGBUR NEGATIVE 05/17/2010 2017   BILIRUBINUR NEGATIVE 05/17/2010 2017   KETONESUR NEGATIVE 05/17/2010 2017   PROTEINUR NEGATIVE 05/17/2010 2017   UROBILINOGEN 0.2 05/17/2010 2017   NITRITE NEGATIVE 05/17/2010 2017   LEUKOCYTESUR  05/17/2010 2017    NEGATIVE MICROSCOPIC NOT DONE ON URINES WITH NEGATIVE PROTEIN, BLOOD, LEUKOCYTES, NITRITE, OR GLUCOSE <1000 mg/dL.   Sepsis Labs: Recent Results (from the past 240 hour(s))  Resp Panel by RT-PCR (Flu A&B, Covid) Nasopharyngeal Swab     Status: None   Collection Time: 05/06/21  6:24 AM   Specimen: Nasopharyngeal Swab; Nasopharyngeal(NP) swabs in vial transport medium  Result Value Ref Range Status   SARS Coronavirus 2 by RT PCR NEGATIVE NEGATIVE Final    Comment: (NOTE) SARS-CoV-2 target nucleic acids are NOT DETECTED.  The SARS-CoV-2 RNA is generally detectable in upper respiratory specimens during the acute phase of infection. The lowest concentration of SARS-CoV-2 viral copies this assay can detect is 138 copies/mL. A negative result does not preclude SARS-Cov-2 infection and should not be used as the sole basis for treatment or other patient management decisions. A negative result may  occur with  improper specimen collection/handling, submission of specimen other than nasopharyngeal swab, presence of viral mutation(s) within the areas targeted by this assay, and inadequate number of viral copies(<138 copies/mL). A negative result must be combined with clinical observations, patient history, and epidemiological information. The expected result is Negative.  Fact Sheet for Patients:  BloggerCourse.com  Fact Sheet for Healthcare Providers:  SeriousBroker.it  This test is no t yet approved or cleared by the [place] FDA and  has been authorized for detection and/or diagnosis of SARS-CoV-2 by FDA under an Emergency Use Authorization (EUA). This EUA will remain  in effect (meaning this test can be used) for the duration of the COVID-19 declaration under Section 564(b)(1) of the Act, 21 U.S.C.section 360bbb-3(b)(1), unless  the authorization is terminated  or revoked sooner.       Influenza A by PCR NEGATIVE NEGATIVE Final   Influenza B by PCR NEGATIVE NEGATIVE Final    Comment: (NOTE) The Xpert Xpress SARS-CoV-2/FLU/RSV plus assay is intended as an aid in the diagnosis of influenza from Nasopharyngeal swab specimens and should not be used as a sole basis for treatment. Nasal washings and aspirates are unacceptable for Xpert Xpress SARS-CoV-2/FLU/RSV testing.  Fact Sheet for Patients: BloggerCourse.com  Fact Sheet for Healthcare Providers: SeriousBroker.it  This test is not yet approved or cleared by the [place] FDA and has been authorized for detection and/or diagnosis of SARS-CoV-2 by FDA under an Emergency Use Authorization (EUA). This EUA will remain in effect (meaning this test can be used) for the duration of the COVID-19 declaration under Section 564(b)(1) of the Act, 21 U.S.C. section 360bbb-3(b)(1), unless the authorization is terminated  or revoked.  Performed at [facility], [address] [place], [place] [address]      Radiological Exams on Admission: DG Chest Port 1 View  Result Date: 05/06/2021 CLINICAL DATA:  Respiratory distress and shortness of breath EXAM: PORTABLE CHEST 1 VIEW COMPARISON:  01/01/2018 FINDINGS: Dual-chamber defibrillator leads from the right. Chronic cardiomegaly. Confluent airspace disease with septal thickening. No definite pleural effusion. IMPRESSION: CHF. Electronically Signed   By: Marnee Spring M.D.   On: 05/06/2021 06:23    EKG: Independently reviewed.  Sinus rhythm at 87 bpm with first-degree heart block, left atrial enlargement, and LBBB  Assessment/Plan Acute respiratory failure with hypoxia secondary to flash pulmonary edema/diastolic congestive heart failure exacerbation: Presents with acute onset of shortness of breath.  Noted to have O2 saturations around 88% on room air on EMS arrival.  Chest x-ray significant for CHF with pulmonary edema.  BNP was elevated at 940.  Patient had been initially switched to BiPAP in the emergency department with decrease in work of breathing, but was able to be weaned down to nasal cannula oxygen.  Last echocardiogram had revealed EF of 50 to 55% with moderate to severe mitral regurgitation. -Admit to a progressive bed -Heart failure order set utilized -Continuous pulse oximetry with oxygen as needed -Continue BiPAP and wean as tolerated -Strict I&O's and daily weight -Lasix 20 mg IV twice daily or as deemed appropriate by cardiology -Continue beta-blocker as tolerated -Check echocardiogram -Cardiology consulted,  will follow-up for further recommendation  Leukocytosis and thrombocytosis: Acute.  WBC elevated 13.1 and platelet count 611.  Patient denies any other infectious symptoms.  Suspect possibly reactive in nature. -Recheck CBC in a.m.  Hypertrophic cardiomyopathy s/p AICD/pacemaker   mitral regurgitation -Follow-up  echocardiogram -Interrogate AICD/pacemaker  Elevated troponin: Acute.  High-sensitivity troponin 22->29.  Suspect secondary to demand in the setting of pulmonary edema.  Patient denies any complaints of chest pain. -Continue to monitor  Acute kidney injury superimposed on chronic kidney disease stage II: Patient presents with creatinine elevated to 1.8 with BUN 22.  Baseline creatinine previously had been around 1.3. -Continue to monitor kidney function with diuresis  Paroxysmal atrial fibrillation on chronic anticoagulation: Patient is on Eliquis.  He had recently been attempted at cardioversion on 5/12-13.  CHA2DS2-VASc score =1. -Maintain at least goal potassium 4 and magnesium 2 -Check TSH -Discontinued Multaq per cardiology -Continued metoprolol as tolerated  Hyperglycemia: Acute.  On admission glucose elevated to 295.  Last hemoglobin A1c was 5.7 back in 2018.  Question if all secondary to acute stress response. -Check hemoglobin  A1c -Continue to monitor and we will place on sliding scale insulin if needed  Anxiety: Patient followed up recently with his primary care provider in regards to anxiety and was recommended to start on Paxil and hydroxyzine as needed.  Patient has not started on the medication as of yet. -Paxil and hydroxyzine as needed  Obesity: BMI 37.12 kg/m -Continue to encourage patient's weight loss through dietary modification  DVT prophylaxis: Lovenox Code Status: Full Family Communication: Significant other updated at bedside Disposition Plan: Hopefully discharge home once medically stable Consults called: Cardiology Admission status: Inpatient, require more than 2 midnight stay for IV diuresis  Clydie Braun MD [facility]   If 7PM-7AM, please contact night-coverage   05/06/2021, 8:34 AM

## 2021-05-06 NOTE — Consult Note (Addendum)
Cardiology Consultation:   Patient ID: Charles Wyatt MRN: [id number]; DOB: 03/29/1971  Admit date: 05/06/2021 Date of Consult: 05/06/2021  PCP:  Sharlene Dory, DO   [facility] Providers Cardiologist:  [name], MD  Electrophysiologist:  Sherryl Manges, MD       Patient Profile:   Charles Wyatt is a 50 y.o. male with a hx of HOCM, PAF, family history of sudden cardiac death s/p ICD for primary prevention, obstructive sleep apnea on CPAP therapy, and previous history of COVID-19 infection in November 2021 who is being seen 05/06/2021 for the evaluation of CHF at the request of Dr. Katrinka Blazing.  History of Present Illness:   Charles Wyatt is a 50 year old male with past medical history of HOCM, PAF, family history of sudden cardiac death s/p ICD for primary prevention, obstructive sleep apnea on CPAP therapy, and previous history of COVID-19 infection in November 2021. He had multiple inappropriate shock from atrial fibrillation with RVR and weight loss stimulant in December 2017.  Patient was admitted in September 2018 with recurrent atrial fibrillation with RVR and was started on amiodarone. Echo obtained on 09/06/2017 showed EF 50%, severe LVH, moderate to severely dilated LA, mild MR.  He was previously referred to Dr. Johney Frame for consideration of atrial fibrillation ablation, however due to low A. fib burden and the left atrial size, it was elected to continue medical therapy.  Patient had a virtual visit with Dr. Eden Emms in November 2021, given his young age and fear of amiodarone induced toxicity, amiodarone was discontinued at that time.  He was seen by Luster Landsberg of EP service on 04/03/2021, device interrogation at the time revealed increased paroxysmal atrial tachycardia but no atrial fibrillation.  Metoprolol tartrate was increased to 100 mg twice a day.  He presented to the emergency room on 04/10/2021 due to recurrence of atrial fibrillation.  He underwent cardioversion x3 in the emergency  room and was seen by Dr. Graciela Husbands who restarted him on higher dose of amiodarone.  He was last seen by Dr. Graciela Husbands on 04/25/2021, since starting on amiodarone at a higher dose, he has been feeling poorly, therefore amiodarone was discontinued and that he was switched to Multaq.  Echocardiogram was repeated and the patient was referred to Dr. Johney Frame to consider ablation procedure again.  Since last week, he has been having intermittent dyspnea during the day.  He is still compliant with CPAP therapy at night.  He says he describes the symptom as an inability to take a deep breath or a shallow expiratory phase.  He does have a cardia mobile device and a pulse oximeter at home.  O2 saturation has been in the low 90s.  He woke up last night to go to the bathroom, upon returning to the bedroom, he started having worsening shortness of breath.  He eventually sought medical attention at [facility], ED.  On arrival, he was tachypneic and hypoxemic.  O2 saturation 88% on room air, he was subsequently placed on BiPAP therapy.  BiPAP therapy was eventually weaned down to 4 L/min nasal cannula.  EKG showed a sinus rhythm with first-degree AV block.  He denies any recent chest pain or palpitation.  Chest x-ray is consistent with pulmonary edema.  BNP elevated at 940.4.  Creatinine has also increased to 1.8.  White blood cell count 13.1.  COVID-19 test negative.  Cardiology service was consulted for pulmonary edema.   Past Medical History:  Diagnosis Date  . Biatrial enlargement   . HCM  06 19/2011 ?[facility] ?Tee to R/o subaortic membrane   . Implantable cardiac defibrillator Medtronic   . 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 Persistent atrial fibrillation (HCC)    Inappro shock  . Snoring     Past Surgical History:  Procedure Laterality Date  . CARDIOVERSION N/A 08/20/2017   Procedure: CARDIOVERSION;  Surgeon: Quintella Reichert, MD;  Location: [facility] ENDOSCOPY;  Service: Cardiovascular;  Laterality: N/A;  .  CARDIOVERSION N/A 08/24/2017   Procedure: CARDIOVERSION;  Surgeon: Jake Bathe, MD;  Location: [facility] ENDOSCOPY;  Service: Cardiovascular;  Laterality: N/A;  . Defibrillator implantation     Medtronic Protecta 314 DRG ICD implanted by [name] for primary prevention  . PPM GENERATOR CHANGEOUT N/A 11/29/2017   EVERA MRI XT DR ICD implanted by [name] for primary prevention  . TEE WITHOUT CARDIOVERSION N/A 08/20/2017   Procedure: TRANSESOPHAGEAL ECHOCARDIOGRAM (TEE);  Surgeon: Quintella Reichert, MD;  Location: [facility] ENDOSCOPY;  Service: Cardiovascular;  Laterality: N/A;     Home Medications:  Prior to Admission medications   Medication Sig Start Date End Date Taking? Authorizing Provider  acetaminophen (TYLENOL) 500 MG tablet Take 500-1,000 mg by mouth every 6 (six) hours as needed for mild pain or headache.   Yes [provider]  apixaban (ELIQUIS) 5 MG TABS tablet TAKE 1 TABLET BY MOUTH 2 (TWO) TIMES DAILY. PLEASE KEEP LAB APPT FOR FURTHER REFILLS. THANKS Patient taking differently: Take 5 mg by mouth 2 (two) times daily. 12/05/20  Yes Wendall Stade, MD  dronedarone (MULTAQ) 400 MG tablet Take 1 tablet (400 mg total) by mouth 2 (two) times daily with a meal. 04/25/21  Yes Duke Salvia, MD  hydrOXYzine (VISTARIL) 25 MG capsule Take 1-3 capsules (25-75 mg total) by mouth every 8 (eight) hours as needed for anxiety. 05/05/21  Yes Sharlene Dory, DO  LORazepam (ATIVAN) 0.5 MG tablet Take 0.5 tablets (0.25 mg total) by mouth daily as needed for anxiety. 04/25/21  Yes Duke Salvia, MD  metoprolol tartrate (LOPRESSOR) 50 MG tablet Take 50 mg by mouth 2 (two) times daily with a meal.   Yes [provider]  PARoxetine (PAXIL) 20 MG tablet Take 1 tablet (20 mg total) by mouth daily. Patient not taking: No sig reported 05/05/21   Sharlene Dory, DO    Inpatient Medications: Scheduled Meds: . apixaban  5 mg Oral BID  . dronedarone  400 mg Oral BID WC  . furosemide  20 mg  Intravenous BID  . metoprolol tartrate  50 mg Oral BID WC  . sodium chloride flush  3 mL Intravenous Q12H   Continuous Infusions: . sodium chloride     PRN Meds: sodium chloride, acetaminophen, hydrOXYzine, LORazepam, ondansetron (ZOFRAN) IV, sodium chloride flush  Allergies:   No Known Allergies  Social History:   Social History   Socioeconomic History  . Marital status: Married    Spouse name: Not on file  . Number of children: Not on file  . Years of education: Not on file  . Highest education level: Not on file  Occupational History  . Not on file  Tobacco Use  . Smoking status: Never Smoker  . Smokeless tobacco: Never Used  Vaping Use  . Vaping Use: Never used  Substance and Sexual Activity  . Alcohol use: Yes    Comment: occasional   . Drug use: No  . Sexual activity: Not on file  Other Topics Concern  . Not on file  Social History Narrative   Denies tobacco , acohol or illicit drug use . He works as a Investment banker, corporateservice technican on machinery. He lives with his girlfriend   No children   Social Determinants of Corporate investment bankerHealth   Financial Resource Strain: Not on file  Food Insecurity: Not on file  Transportation Needs: Not on file  Physical Activity: Not on file  Stress: Not on file  Social Connections: Not on file  Intimate Partner Violence: Not on file    Family History:    Family History  Problem Relation Age of Onset  . Cardiomyopathy Other        hypertensive obstructive cardiomyopathy     ROS:  Please see the history of present illness.   All other ROS reviewed and negative.     Physical Exam/Data:   Vitals:   05/06/21 1200 05/06/21 1215 05/06/21 1230 05/06/21 1245  BP: 113/81 114/76 115/76 119/86  Pulse: 65 61 68 (!) 50  Resp: 19 20 20 16   Temp:      TempSrc:      SpO2: 96% 96% 98% 98%  Weight:      Height:        Intake/Output Summary (Last 24 hours) at 05/06/2021 1304 Last data filed at 05/06/2021 1017 Gross per 24 hour  Intake --  Output 1400 ml   Net -1400 ml   Last 3 Weights 05/06/2021 05/05/2021 04/25/2021  Weight (lbs) 237 lb 237 lb 2 oz 239 lb  Weight (kg) 107.502 kg 107.559 kg 108.41 kg     Body mass index is 37.12 kg/m.  General:  Well nourished, well developed, in no acute distress HEENT: normal Lymph: no adenopathy Neck: no JVD Endocrine:  No thryomegaly Vascular: No carotid bruits; FA pulses 2+ bilaterally without bruits  Cardiac:  normal S1, S2; RRR; no murmur  Lungs: diminished breath sound bilaterally  Abd: soft, nontender, no hepatomegaly  Ext: no edema Musculoskeletal:  No deformities, BUE and BLE strength normal and equal Skin: warm and dry  Neuro:  CNs 2-12 intact, no focal abnormalities noted Psych:  Normal affect   EKG:  The EKG was personally reviewed and demonstrates:  NSR with 1st degree AV block, no significant change when compare to last EKG on 04/25/2021 Telemetry:  Telemetry was personally reviewed and demonstrates:  NSR without significant ventricular ectopy  Relevant CV Studies:  Echo 09/06/2017 - Left ventricle: LVEF is approximately 50% with hypokinesis of the  base/mid inferior and inferoseptal walls. The cavity size was  normal. Wall thickness was increased in a pattern of severe LVH.  - Mitral valve: There was mild regurgitation.  - Left atrium: The atrium was moderately to severely dilated.  - Pericardium, extracardiac: A trivial pericardial effusion was  identified.   Laboratory Data:  High Sensitivity Troponin:   Recent Labs  Lab 05/06/21 0607 05/06/21 0807  TROPONINIHS 22* 29*     Chemistry Recent Labs  Lab 05/06/21 0607  NA 136  K 4.2  CL 104  CO2 19*  GLUCOSE 295*  BUN 22*  CREATININE 1.80*  CALCIUM 8.6*  GFRNONAA 46*  ANIONGAP 13    No results for input(s): PROT, ALBUMIN, AST, ALT, ALKPHOS, BILITOT in the last 168 hours. Hematology Recent Labs  Lab 05/06/21 0607  WBC 13.1*  RBC 4.85  HGB 15.6  HCT 48.3  MCV 99.6  MCH 32.2  MCHC 32.3  RDW 12.8   PLT 611*   BNP Recent Labs  Lab 05/06/21 0607  BNP 940.4*  DDimer No results for input(s): DDIMER in the last 168 hours.   Radiology/Studies:  DG Chest Port 1 View  Result Date: 05/06/2021 CLINICAL DATA:  Respiratory distress and shortness of breath EXAM: PORTABLE CHEST 1 VIEW COMPARISON:  01/01/2018 FINDINGS: Dual-chamber defibrillator leads from the right. Chronic cardiomegaly. Confluent airspace disease with septal thickening. No definite pleural effusion. IMPRESSION: CHF. Electronically Signed   By: Marnee Spring M.D.   On: 05/06/2021 06:23     Assessment and Plan:   1. Pulmonary edema  -He continues to have bibasilar rales on exam.  Continue IV diuresis for now.  -Unclear trigger for pulmonary edema, ICD interrogated, no afib and ventricular ectopy since May 27th. Device report does reveal fluid accumulation since 04/27/2021. Will stop multaq given contraindication with CHF. Per Dr. Eden Emms, will obtain stat echo today.   2. Recurrent paroxysmal atrial fibrillation:  - recently seen by Dr. Graciela Husbands, unable to tolerate amiodarone due to SE, switched to multaq. Pending outpatient echo and referral to Dr. Johney Frame to consider afib ablation again.   - will discuss with MD, potentially consider getting EP on board during this admission  - continue Eliquis. Obtain EP consult.   3. Hypertrophic cardiomyopathy: obtain repeat echocardiogram  4. Family history of sudden cardiac death s/p ICD  5. Obstructive sleep apnea on CPAP therapy  6. Acute on chronic renal insufficiency: baseline Cr 1.4, arrived with Cr 1.80. Patient says he has had significant weight loss and poor oral take recently due to anxiety issue.    Risk Assessment/Risk Scores:        New York Heart Association (NYHA) Functional Class NYHA Class II  CHA2DS2-VASc Score = 2  This indicates a 2.2% annual risk of stroke. The patient's score is based upon: CHF History: Yes HTN History: Yes Diabetes History:  No Stroke History: No Vascular Disease History: No Age Score: 0 Gender Score: 0         For questions or updates, please contact [facility] Please consult [email or web address] for contact info under    Ramond Dial, [place]  05/06/2021 1:04 PM

## 2021-05-06 NOTE — Consult Note (Addendum)
Cardiology Consultation:   Patient ID: Charles Wyatt MRN: [id number]; DOB: 05-28-1971  Admit date: 05/06/2021 Date of Consult: 05/06/2021  PCP:  Charles Dory, DO   [facility] Providers Cardiologist:  Charles Haws, MD  Electrophysiologist:  Charles Manges, MD  {   Patient Profile:   Charles Wyatt is a 50 y.o. male with a hx of HCM, ICD implanted originally 2011 (in setting of a family history of sudden death and significant septal thickness and hyperenhancing on cardiac MRI), Afib, VT  who is being seen 05/06/2021 for the evaluation of AAD options at the request of Charles Wyatt.   Device information MDT dual chamber ICD implanted 08/20/2010, gen change 11/29/2017 + appropriate therapies VT 2017 (in setting of weight loss aid/stimulant and off his BB) + inappropriate shock for AFib w/RVR  AAD  2018 Amiodarone for AFib > d/c Nov 2021 given youn gage and potential side effects resumed may 2022 with recurrent AFib Amiodarone Stopped may 22 with intolerance  > started on Multaq AF history Diagnosed 2014  History of Present Illness:   Charles Wyatt saw Charles Wyatt Nov 2021 via tele health, doing well, discussed and decided to stop amiodarone and follow for AF burden and re-refer to Charles Wyatt if recurrent for re-eval for ablation  He saw Charles Wyatt, last seen by him  02/03/21, was doing well, off amiodarone, mild volume OL, encouraged salt and fluid restriction.  He had an ER visit 02/22/21 with c/o palpitations, ICD interrogated with one NSVT 10beats only not felt to explain frequency of his palpitatins Discharged from the ER, EKG noting PACs  I saw him 04/03/21 with c/o an up-tick in his palpitations, no other symptoms, he had not had AFib though did have some AT episodes that were short with a burden of <1%, his lopressor was increased some with plans to discuss with Charles Wyatt  04/11/21 he was in the ER with AFib and cardoverted in the ER (requiring 3 shocks), mildly hypokalemic  and replaced, he was seen by Charles Wyatt with plans to resume amiodarone (loading PO) and planned for out patient echo and follow up  He saw Charles Wyatt 04/25/21 he reports feeling fatigue, weakness and anxiety since his last visit. He reported he had been feeling more anxious than his baseline. Also some sensation of mild shortness of breath that last for 30 minutes.  The patient denied chest pain, nocturnal dyspnea, orthopnea, or peripheral edema, no palpitations, lightheadedness or syncope. Felt he was not tolerating the amiodarone and stopped. Started on Multaq And discussed re-referring him to Charles Wyatt to reconsider ablation. Labs done and was still pending his echo  He came to the ER today with about a week of progressive SOB, described as the inability to get a good breath in, he was monitoring his O2 sats staying in the 90's, last night after getting up to use the BR once back in bed acutely more SOB. Once here he was tachypneic and hypoxemic.  O2 saturation 88% on room air, he was subsequently placed on BiPAP therapy.  BiPAP therapy was eventually weaned down to 4 L/min nasal cannula.  He was in SR, CXR c/w pulmonary edema, BNP 940 and AKI noted Started on IV diuretic and admitted. Cardiology consulted, device interrogation noted no arrhythmias., planned for on goping diuresis and echo, his multaq stopped  He was previously seen by Charles Wyatt back in 2019, noted biatrial enlargement, discussed ablation, the pt had done significant personal research as well, preferred to  continue medical management with low burden.  Charles Wyatt suggested Norpace as a potential alternative to amiodarone though deferred to Charles Wyatt for AAD management.  EP is asked to weigh in on AAD  He saw hie PMD yesterday to discuss his anxiety, was prescribed paxil but not yet started it  LABS K+ 4.2 BUN/Creat 22/1.80 (baseline about Creat 1.35) BNP 940 HS Trop 22, 29 WBC 13.1 H/H 15/48 Plts 611  He is feelimg  better, much less SOB, though occasionally can still hear som crackles when he is exhaling   Past Medical History:  Diagnosis Date  . Biatrial enlargement   . HCM    06 19/2011 ?[facility] ?Tee to R/o subaortic membrane   . Implantable cardiac defibrillator Medtronic   . 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 Persistent atrial fibrillation (HCC)    Inappro shock  . Snoring     Past Surgical History:  Procedure Laterality Date  . CARDIOVERSION N/A 08/20/2017   Procedure: CARDIOVERSION;  Surgeon: Quintella Reichert, MD;  Location[facility] ENDOSCOPY;  Service: Cardiovascular;  Laterality: N/A;  . CARDIOVERSION N/A 08/24/2017   Procedure: CARDIOVERSION;  Surgeon: Jake Bathe, MD;  Location: [facility] ENDOSCOPY;  Service: Cardiovascular;  Laterality: N/A;  . Defibrillator implantation     Medtronic Protecta 314 DRG ICD implanted by Dr Charles Wyatt for primary prevention  . PPM GENERATOR CHANGEOUT N/A 11/29/2017   EVERA MRI XT DR ICD implanted by Dr Charles Wyatt for primary prevention  . TEE WITHOUT CARDIOVERSION N/A 08/20/2017   Procedure: TRANSESOPHAGEAL ECHOCARDIOGRAM (TEE);  Surgeon: Quintella Reichert, MD;  Location: [facility] ENDOSCOPY;  Service: Cardiovascular;  Laterality: N/A;     Home Medications:  Prior to Admission medications   Medication Sig Start Date End Date Taking? Authorizing Provider  acetaminophen (TYLENOL) 500 MG tablet Take 500-1,000 mg by mouth every 6 (six) hours as needed for mild pain or headache.   Yes [provider]  apixaban (ELIQUIS) 5 MG TABS tablet TAKE 1 TABLET BY MOUTH 2 (TWO) TIMES DAILY. PLEASE KEEP LAB APPT FOR FURTHER REFILLS. THANKS Patient taking differently: Take 5 mg by mouth 2 (two) times daily. 12/05/20  Yes Wendall Stade, MD  dronedarone (MULTAQ) 400 MG tablet Take 1 tablet (400 mg total) by mouth 2 (two) times daily with a meal. 04/25/21  Yes Duke Salvia, MD  hydrOXYzine (VISTARIL) 25 MG capsule Take 1-3 capsules (25-75 mg total) by mouth every 8 (eight) hours as needed  for anxiety. 05/05/21  Yes Charles Dory, DO  LORazepam (ATIVAN) 0.5 MG tablet Take 0.5 tablets (0.25 mg total) by mouth daily as needed for anxiety. 04/25/21  Yes Duke Salvia, MD  metoprolol tartrate (LOPRESSOR) 50 MG tablet Take 50 mg by mouth 2 (two) times daily with a meal.   Yes [provider]  PARoxetine (PAXIL) 20 MG tablet Take 1 tablet (20 mg total) by mouth daily. Patient not taking: No sig reported 05/05/21   Charles Dory, DO    Inpatient Medications: Scheduled Meds: . apixaban  5 mg Oral BID  . furosemide  40 mg Intravenous BID  . metoprolol tartrate  50 mg Oral BID WC  . sodium chloride flush  3 mL Intravenous Q12H   Continuous Infusions: . sodium chloride     PRN Meds: sodium chloride, acetaminophen, hydrOXYzine, LORazepam, ondansetron (ZOFRAN) IV, sodium chloride flush  Allergies:   No Known Allergies  Social History:   Social History   Socioeconomic History  . Marital status:  Married    Spouse name: Not on file  . Number of children: Not on file  . Years of education: Not on file  . Highest education level: Not on file  Occupational History  . Not on file  Tobacco Use  . Smoking status: Never Smoker  . Smokeless tobacco: Never Used  Vaping Use  . Vaping Use: Never used  Substance and Sexual Activity  . Alcohol use: Yes    Comment: occasional   . Drug use: No  . Sexual activity: Not on file  Other Topics Concern  . Not on file  Social History Narrative   Denies tobacco , acohol or illicit drug use . He works as a Investment banker, corporateservice technican on machinery. He lives with his girlfriend   No children   Social Determinants of Corporate investment bankerHealth   Financial Resource Strain: Not on file  Food Insecurity: Not on file  Transportation Needs: Not on file  Physical Activity: Not on file  Stress: Not on file  Social Connections: Not on file  Intimate Partner Violence: Not on file    Family History:   Family History  Problem Relation Age of Onset   . Cardiomyopathy Other        hypertensive obstructive cardiomyopathy     ROS:  Please see the history of present illness.  All other ROS reviewed and negative.     Physical Exam/Data:   Vitals:   05/06/21 1300 05/06/21 1315 05/06/21 1330 05/06/21 1345  BP: 122/86 118/78 105/70 102/78  Pulse: 64 64 80 79  Resp: 18 19 19  (!) 23  Temp:      TempSrc:      SpO2: 95% 96% 98% 95%  Weight:      Height:        Intake/Output Summary (Last 24 hours) at 05/06/2021 1348 Last data filed at 05/06/2021 1017 Gross per 24 hour  Intake --  Output 1400 ml  Net -1400 ml   Last 3 Weights 05/06/2021 05/05/2021 04/25/2021  Weight (lbs) 237 lb 237 lb 2 oz 239 lb  Weight (kg) 107.502 kg 107.559 kg 108.41 kg     Body mass index is 37.12 kg/m.  General:  Well nourished, well developed, in no acute distress HEENT: normal Lymph: no adenopathy Neck: no JVD Endocrine:  No thryomegaly Vascular: No carotid bruits Cardiac:  RRR; 1/6SM, no gallops or rubs Lungs: fine crackles at the bases, moving air well, no wheezing, rhonchi or rales  Abd: soft, nontender Ext: no edema Musculoskeletal:  No deformities Skin: warm and dry  Neuro:   No gross focal abnormalities noted Psych:  Normal affect   EKG:  The EKG was personally reviewed and demonstrates:   AR87bpm, 1st degree AVblock 239ms, QTC 486ms  Telemetry:  Telemetry was personally reviewed and demonstrates:   SR, occ PACs, generally 60's-70s  Relevant CV Studies:  09/06/2017: TTE Study Conclusions  - Left ventricle: LVEF is approximately 50% with hypokinesis of the  base/mid inferior and inferoseptal walls. The cavity size was  normal. Wall thickness was increased in a pattern of severe LVH.  - Mitral valve: There was mild regurgitation.  - Left atrium: The atrium was moderately to severely dilated.  - Pericardium, extracardiac: A trivial pericardial effusion was  identified.   08/20/2017: TEE Study Conclusions  - Left ventricle: There  was severe asymmetric hypertrophy of the  septum. Systolic function was normal. The estimated ejection  fraction was in the range of 50% to 55%. Wall motion was normal;  there were no regional wall motion abnormalities.  - Aortic valve: Structurally normal valve. Trileaflet; normal  thickness leaflets.  - Mitral valve: There was moderate to severe regurgitation directed  eccentrically likely from annular dilatation. No evidence of SAM.  The flow wraps around the LA with intermittent flow in the  pulmonary vein.  - Left atrium: The atrium was massively dilated. No evidence of  thrombus in the atrial cavity or appendage. There was  mildintermittent spontaneous echo contrast (&quot;smoke&quot;) in the  cavity. The appendage was morphologically a left appendage,  multilobulated, and of normal size. Emptying velocity was mildly  reduced.  - Right atrium: No evidence of thrombus in the atrial cavity or  appendage.  - Atrial septum: The septum bowed from left to right, consistent  with increased left atrial pressure.  - Pulmonic valve: No evidence of vegetation. There was trivial  regurgitation.    Laboratory Data:  High Sensitivity Troponin:   Recent Labs  Lab 05/06/21 0607 05/06/21 0807  TROPONINIHS 22* 29*     Chemistry Recent Labs  Lab 05/06/21 0607  NA 136  K 4.2  CL 104  CO2 19*  GLUCOSE 295*  BUN 22*  CREATININE 1.80*  CALCIUM 8.6*  GFRNONAA 46*  ANIONGAP 13    No results for input(s): PROT, ALBUMIN, AST, ALT, ALKPHOS, BILITOT in the last 168 hours. Hematology Recent Labs  Lab 05/06/21 0607  WBC 13.1*  RBC 4.85  HGB 15.6  HCT 48.3  MCV 99.6  MCH 32.2  MCHC 32.3  RDW 12.8  PLT 611*   BNP Recent Labs  Lab 05/06/21 0607  BNP 940.4*    DDimer No results for input(s): DDIMER in the last 168 hours.   Radiology/Studies:  DG Chest Port 1 View  Result Date: 05/06/2021 CLINICAL DATA:  Respiratory distress and shortness of  breath EXAM: PORTABLE CHEST 1 VIEW COMPARISON:  01/01/2018 FINDINGS: Dual-chamber defibrillator leads from the right. Chronic cardiomegaly. Confluent airspace disease with septal thickening. No definite pleural effusion. IMPRESSION: CHF. Electronically Signed   By: Marnee Spring M.D.   On: 05/06/2021 06:23     Assessment and Plan:   1. HCM (not known to have obstruction)     The patient thinks though h 2. Admitted with HF exacerbation     Echo is pending     Attending cardiology is managing diuretics/HF   3. Paroxysmal AFIb     CHA2DS2Vasc is one > 2 with CHF     Historically on amiodarone with good arrhythmia suppression     recently resumed though unable to tolerate     Multaq tried, now with HF not an option.     In brief discussion with Charles Wyatt, as he had previously suggested might consider Norpace     Perhaps amio again at lower dose  Dre. Carlinda Ohlson will see him later today       Risk Assessment/Risk Scores:  { For questions or updates, please contact [facility] Please consult [email or web address] for contact info under    Signed, Sheilah Pigeon, PA-C  05/06/2021 1:48 PM    I have seen, examined the patient, and reviewed the above assessment and plan.  Changes to above are made where necessary.  On exam, RRR.   Would stop multaq at this time.  General cardiology to continue fluid management.  Would consider Norpace as next AAD option.  Given severe MR and severe LA enlargement, I am not optimistic about long term success with ablation.  I  agree with Dr Charles Wyatt that TEE and further workup of MR is prudent.  I also agree that ultimately MV repair/ MAZE may be his best option.    Co Sign: Hillis Range, MD 05/06/2021 5:10 PM

## 2021-05-06 NOTE — Progress Notes (Signed)
  Echocardiogram 2D Echocardiogram with 3D and strain has been performed.  Charles Wyatt 05/06/2021, 2:12 PM

## 2021-05-06 NOTE — Progress Notes (Signed)
Pt placed on bipap on arrival per MD. PT tolerating well at this time.

## 2021-05-06 NOTE — Progress Notes (Signed)
Pt has home CPAP. Sterile water given to pt for his CPAP.

## 2021-05-06 NOTE — ED Triage Notes (Signed)
Pt EMS arrival from home respiratory distress, per EMS pt 88% RA upon fire arrival nonrebreather applied, EMS applied CPAP, pt diaphoretic, pale bilateral coarse rales assessed. EMS administered 2 sublingual nitro, pacemaker hx afib, obstructive cardiac myopathy

## 2021-05-06 NOTE — Progress Notes (Signed)
RT removed pt from BiPAP and placed pt on 3LNC. Pt tolerated well with SVS. RT notified RN. RT will continue to monitor pt as needed.

## 2021-05-06 NOTE — Progress Notes (Signed)
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 He has had two TEE;s before and had ?  Aspiration/ pneumonia after one of them   Risks of both procedures discussed at length with wife And patient Willing to proceed  He also understands that if MR does not improve with medical Rx He may be looking at MV repair/Maze type procedure that Would likely be done at hospital other than [facility]  Time spent reviewing echo and discussing Rx /diagnositic Issues with patient and wife 30 minutes   [name] MD [facility]

## 2021-05-07 DIAGNOSIS — I422 Other hypertrophic cardiomyopathy: Secondary | ICD-10-CM

## 2021-05-07 LAB — CBC
HCT: 46 % (ref 39.0–52.0)
Hemoglobin: 15.2 g/dL (ref 13.0–17.0)
MCH: 32 pg (ref 26.0–34.0)
MCHC: 33 g/dL (ref 30.0–36.0)
MCV: 96.8 fL (ref 80.0–100.0)
Platelets: 441 10*3/uL — ABNORMAL HIGH (ref 150–400)
RBC: 4.75 MIL/uL (ref 4.22–5.81)
RDW: 12.8 % (ref 11.5–15.5)
WBC: 8.3 10*3/uL (ref 4.0–10.5)
nRBC: 0 % (ref 0.0–0.2)

## 2021-05-07 LAB — BASIC METABOLIC PANEL
Anion gap: 9 (ref 5–15)
BUN: 21 mg/dL — ABNORMAL HIGH (ref 6–20)
CO2: 30 mmol/L (ref 22–32)
Calcium: 9.3 mg/dL (ref 8.9–10.3)
Chloride: 99 mmol/L (ref 98–111)
Creatinine, Ser: 1.66 mg/dL — ABNORMAL HIGH (ref 0.61–1.24)
GFR, Estimated: 51 mL/min — ABNORMAL LOW (ref >60)
Glucose, Bld: 132 mg/dL — ABNORMAL HIGH (ref 70–99)
Potassium: 3.9 mmol/L (ref 3.5–5.1)
Sodium: 138 mmol/L (ref 135–145)

## 2021-05-07 LAB — HEMOGLOBIN A1C
Hgb A1c MFr Bld: 5.9 % — ABNORMAL HIGH (ref 4.8–5.6)
Mean Plasma Glucose: 123 mg/dL

## 2021-05-07 MED ORDER — AMIODARONE HCL IN DEXTROSE 360-4.14 MG/200ML-% IV SOLN
60.0000 mg/h | INTRAVENOUS | Status: DC
  Administered 2021-05-07 (×2): 60 mg/h via INTRAVENOUS
  Filled 2021-05-07 (×2): qty 200

## 2021-05-07 MED ORDER — APIXABAN 5 MG PO TABS
5.0000 mg | ORAL_TABLET | Freq: Once | ORAL | Status: AC
  Administered 2021-05-07: 5 mg via ORAL
  Filled 2021-05-07: qty 1

## 2021-05-07 MED ORDER — HYDROXYZINE HCL 25 MG PO TABS
25.0000 mg | ORAL_TABLET | Freq: Three times a day (TID) | ORAL | Status: DC | PRN
  Administered 2021-05-07 – 2021-05-08 (×2): 25 mg via ORAL
  Filled 2021-05-07: qty 1
  Filled 2021-05-07: qty 3

## 2021-05-07 MED ORDER — AMIODARONE HCL IN DEXTROSE 360-4.14 MG/200ML-% IV SOLN
30.0000 mg/h | INTRAVENOUS | Status: DC
  Administered 2021-05-08 – 2021-05-09 (×3): 30 mg/h via INTRAVENOUS
  Filled 2021-05-07 (×3): qty 200

## 2021-05-07 MED ORDER — AMIODARONE LOAD VIA INFUSION
150.0000 mg | Freq: Once | INTRAVENOUS | Status: AC
  Administered 2021-05-07: 150 mg via INTRAVENOUS
  Filled 2021-05-07: qty 83.34

## 2021-05-07 MED ORDER — SODIUM CHLORIDE 0.9 % IV SOLN: INTRAVENOUS | Status: DC

## 2021-05-07 MED ORDER — APIXABAN 5 MG PO TABS
5.0000 mg | ORAL_TABLET | Freq: Two times a day (BID) | ORAL | Status: DC
  Administered 2021-05-07 – 2021-05-09 (×4): 5 mg via ORAL
  Filled 2021-05-07 (×4): qty 1

## 2021-05-07 NOTE — Progress Notes (Signed)
Mobility Specialist - Progress Note   05/07/21 1140  Mobility  Activity Refused mobility   Pt has been moving independently in his room and does not wish to ambulate in the hallway, will f/u as able.   Mamie Levers Mobility Specialist Mobility Specialist Phone: [phone]

## 2021-05-07 NOTE — Progress Notes (Signed)
Heart Failure Navigator Progress Note  Assessed for Heart & Vascular TOC clinic readiness.  Unfortunately at this time the patient does not meet criteria due to PMH of HOCM - LVEF 55-60%.   Navigator available for reassessment of patient.   Sharen Hones, PharmD, BCPS Heart Failure Stewardship Pharmacist Phone [phone]

## 2021-05-07 NOTE — Progress Notes (Signed)
Patient converted to atrial fibrillation; rates 100s-120s. Notified Dr. Eden Emms via phone. Patient feels tired, but denies chest pain or shortness of breath.

## 2021-05-07 NOTE — Progress Notes (Signed)
Pt has home CPAP, no assistance needed.  

## 2021-05-07 NOTE — Progress Notes (Signed)
PROGRESS NOTE    Charles DialsMarshall Wyatt  ZOX:[id number] DOB: 01/11/1971 DOA: 05/06/2021 PCP: Sharlene DoryWendling, Nicholas Paul, DO    Brief Narrative:  Charles DialsMarshall Zilka is a 50 year old male with past medical history significant for hypertrophic cardiomyopathy s/p AICD, paroxysmal atrial fibrillation on anticoagulation, OSA on CPAP, obesity who [facility] ED on 6/7 with acute shortness of breath.  Patient reports awoken from sleep around 4 AM with difficulty breathing with associated cough with blood-tinged sputum.  Patient also endorses weight loss of approximately 14 pounds over the last month with dietary changes.  Patient denies any fever, no chest pain, no orthopnea, no nausea/vomiting/diarrhea.  Patient was seen by his PCP yesterday due to his progressive anxiety and being unable to sleep at night.  Patient was found to be in atrial fibrillation and was cardioverted on 5/13 and started back on amiodarone.  However patient reported feeling very bad after placed on this medication and was switched to Multaq on 5/27 after his follow-up visit with Dr. Graciela HusbandsKlein.  Upon EMS arrival, patient was noted to have O2 saturations of 88% on room air was placed on nonrebreather and subsequently switched to CPAP.  He was given 2 sublingual nitroglycerin tablets and was noted to be in atrial fibrillation.  In the ED, temperature 97.8 F, HR 88, RR 28, BP 137/81, SPO2 88% on room air, with improvement on BiPAP 99-100%.  Labs significant for WBC 13.1, platelets 611, BUN 22, creatinine 1.8, glucose 295, BNP 940.4, and high-sensitivity troponin 22.  Chest x-ray was consistent with congestive heart failure.  COVID-19 and influenza screening were both negative.  Patient was given 20 mg of Lasix IV.    Cardiology was consulted.  TRH called to admit.   Assessment & Plan:   Principal Problem:   Acute respiratory failure with hypoxia (HCC) Active Problems:   OBESITY   Hypertrophic cardiomyopathy (HCC)   Implantable  cardioverter-defibrillator (ICD) in situ   PAF (paroxysmal atrial fibrillation) (HCC)   Acute kidney injury superimposed on CKD (HCC)   Thrombocytosis   Acute systolic CHF (congestive heart failure) (HCC)   Leukocytosis   Elevated troponin   Acute hypoxic respiratory failure, POA Flash pulmonary edema Acute on chronic diastolic congestive heart failure Hypertrophic cardiomyopathy s/p AICD Severe mitral regurgitation Patient presenting with acute onset shortness of breath, noted to have O2 saturations 88% on room air.  Chest x-ray significant for pulmonary edema.  BNP elevated 940.  Patient was initially placed on BiPAP in the ED with decreased work of breathing and now weaned down to nasal cannula.  TTE with LVEF 55-60%, grade 3 diastolic dysfunction, LA severely dilated, severe MR, MR leaflets malcoaptation.  --Cardiology following, appreciate assistance --net negative 2.4L past 24 hours --Furosemide 40 mg IV q12h --Metoprolol tartrate 50 mg p.o. twice daily --Strict I's and O's and daily weights --TEE and R/L heart catheterization planned for 05/08/21 -- N.p.o. after midnight, holding Eliquis --Follow BMP daily -- Continue monitor on telemetry  Elevated troponin High-sensitivity troponin 22>29.  Etiology likely secondary to type II demand ischemia in the setting of pulmonary edema/volume overload. 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  Patient presenting with a creatinine elevated to 1.8. --Cr 1.8>1.66 --Continues on IV diuresis as above --Avoid nephrotoxins, renally dose all medications --BMP daily  Paroxysmal atrial fibrillation Recently attempted cardioversion on 5/13.  CHA2DS2-VASc score = 1.  --Cardiology starting amiodarone drip --Multaq discontinued by cardiology; considering adding Norpace this admission --Continue  Eliquis, currently on hold tonight for planned TEE and right/left heart cath  tomorrow  Anxiety Patiently recently been seen by PCP in regards to anxiety and was recommended started on Paxil and hydroxyzine as needed.  These medications have not been started yet outpatient. --Paroxetine 20 mg p.o. daily --Hydroxyzine every 8 hours as needed anxiety --Ativan 0.25 mg p.o. daily anxiety not relieved with hydroxyzine  OSA --Continue nocturnal CPAP  Obesity Body mass index is 35.55 kg/m.  Discussed with patient needs for aggressive lifestyle changes/weight loss as this complicates all facets of care.  Reports recent 14 pound weight loss after dietary changes.  Outpatient follow-up with PCP.     DVT prophylaxis:  apixaban (ELIQUIS) tablet 5 mg    Code Status: Full Code Family Communication: Updated family present at bedside this morning.  Disposition Plan:  Level of care: Progressive Status is: Inpatient  Remains inpatient appropriate because:Ongoing diagnostic testing needed not appropriate for outpatient work up, Unsafe d/c plan, IV treatments appropriate due to intensity of illness or inability to take PO and Inpatient level of care appropriate due to severity of illness   Dispo: The patient is from: Home              Anticipated d/c is to: Home              Patient currently is not medically stable to d/c.   Difficult to place patient No  Consultants:   Cardiology/electrophysiology  Procedures:   TTE  TEE/right/left heart catheterization: Pending  Antimicrobials:   None   Subjective: Patient seen examined bedside, resting comfortably.  Reports poor sleep overnight.  Was placed on oxygen overnight despite being on CPAP with continued desaturations while sleeping.  Plan for TEE and right/left heart catheterization tomorrow.  No other complaints or concerns at this time.  Denies headache, no fever/chills/night sweats, no dizziness, no chest pain, no palpitations, no nausea/vomiting/diarrhea, no abdominal pain, no weakness, no fatigue, no  paresthesias.  No acute events overnight per nursing staff.  Objective: Vitals:   05/06/21 2315 05/07/21 0520 05/07/21 0817 05/07/21 0941  BP: 106/83 109/73 113/74   Pulse: 76 60 61 64  Resp:   13   Temp: 98.2 F (36.8 C) 98.6 F (37 C) 98.6 F (37 C)   TempSrc: Oral Oral Oral   SpO2: 99% 94% 95%   Weight:  103 kg    Height:        Intake/Output Summary (Last 24 hours) at 05/07/2021 1137 Last data filed at 05/07/2021 0047 Gross per 24 hour  Intake 643 ml  Output 1680 ml  Net -1037 ml   Filed Weights   05/06/21 0607 05/06/21 1800 05/07/21 0520  Weight: 107.5 kg 104 kg 103 kg    Examination:  General exam: Appears calm and comfortable  Respiratory system: Breath sounds slightly decreased bilateral bases with mild crackles, no wheezes, normal [place]paire effort, on 2 L nasal cannula. Cardiovascular system: S1 & S2 heard, RRR. No JVD, murmurs, rubs, gallops or clicks. No pedal edema. Gastrointestinal system: Abdomen is nondistended, soft and nontender. No organomegaly or masses felt. Normal bowel sounds heard. Central nervous system: Alert and oriented. No focal neurological deficits. Extremities: Symmetric 5 x 5 power. Skin: No rashes, lesions or ulcers Psychiatry: Judgement and insight appear normal. Mood & affect appropriate.     Data Reviewed: I have personally reviewed following labs and imaging studies  CBC: Recent Labs  Lab 05/06/21 0607 05/07/21 0313  WBC 13.1* 8.3  NEUTROABS  9.1*  --   HGB 15.6 15.2  HCT 48.3 46.0  MCV 99.6 96.8  PLT 611* 441*   Basic Metabolic Panel: Recent Labs  Lab 05/06/21 0607 05/07/21 0313  NA 136 138  K 4.2 3.9  CL 104 99  CO2 19* 30  GLUCOSE 295* 132*  BUN 22* 21*  CREATININE 1.80* 1.66*  CALCIUM 8.6* 9.3   GFR: Estimated Creatinine Clearance: 62.3 mL/min (A) (by C-G formula based on SCr of 1.66 mg/dL (H)). Liver Function Tests: No results for input(s): AST, ALT, ALKPHOS, BILITOT, PROT, ALBUMIN in the last 168 hours. No  results for input(s): LIPASE, AMYLASE in the last 168 hours. No results for input(s): AMMONIA in the last 168 hours. Coagulation Profile: No results for input(s): INR, PROTIME in the last 168 hours. Cardiac Enzymes: No results for input(s): CKTOTAL, CKMB, CKMBINDEX, TROPONINI in the last 168 hours. BNP (last 3 results) No results for input(s): PROBNP in the last 8760 hours. HbA1C: Recent Labs    05/06/21 1807  HGBA1C 5.9*   CBG: Recent Labs  Lab 05/06/21 1141  GLUCAP 96   Lipid Profile: No results for input(s): CHOL, HDL, LDLCALC, TRIG, CHOLHDL, LDLDIRECT in the last 72 hours. Thyroid Function Tests: Recent Labs    05/06/21 1807  TSH 0.196*   Anemia Panel: No results for input(s): VITAMINB12, FOLATE, FERRITIN, TIBC, IRON, RETICCTPCT in the last 72 hours. Sepsis Labs: No results for input(s): PROCALCITON, LATICACIDVEN in the last 168 hours.  Recent Results (from the past 240 hour(s))  Resp Panel by RT-PCR (Flu A&B, Covid) Nasopharyngeal Swab     Status: None   Collection Time: 05/06/21  6:24 AM   Specimen: Nasopharyngeal Swab; Nasopharyngeal(NP) swabs in vial transport medium  Result Value Ref Range Status   SARS Coronavirus 2 by RT PCR NEGATIVE NEGATIVE Final    Comment: (NOTE) SARS-CoV-2 target nucleic acids are NOT DETECTED.  The SARS-CoV-2 RNA is generally detectable in upper respiratory specimens during the acute phase of infection. The lowest concentration of SARS-CoV-2 viral copies this assay can detect is 138 copies/mL. A negative result does not preclude SARS-Cov-2 infection and should not be used as the sole basis for treatment or other patient management decisions. A negative result may occur with  improper specimen collection/handling, submission of specimen other than nasopharyngeal swab, presence of viral mutation(s) within the areas targeted by this assay, and inadequate number of viral copies(<138 copies/mL). A negative result must be combined  with clinical observations, patient history, and epidemiological information. The expected result is Negative.  Fact Sheet for Patients:  BloggerCourse.com  Fact Sheet for Healthcare Providers:  SeriousBroker.it  This test is no t yet approved or cleared by the [place] FDA and  has been authorized for detection and/or diagnosis of SARS-CoV-2 by FDA under an Emergency Use Authorization (EUA). This EUA will remain  in effect (meaning this test can be used) for the duration of the COVID-19 declaration under Section 564(b)(1) of the Act, 21 U.S.C.section 360bbb-3(b)(1), unless the authorization is terminated  or revoked sooner.       Influenza A by PCR NEGATIVE NEGATIVE Final   Influenza B by PCR NEGATIVE NEGATIVE Final    Comment: (NOTE) The Xpert Xpress SARS-CoV-2/FLU/RSV plus assay is intended as an aid in the diagnosis of influenza from Nasopharyngeal swab specimens and should not be used as a sole basis for treatment. Nasal washings and aspirates are unacceptable for Xpert Xpress SARS-CoV-2/FLU/RSV testing.  Fact Sheet for Patients: BloggerCourse.com  Fact Sheet  for Healthcare Providers: SeriousBroker.it  This test is not yet approved or cleared by the [place] and has been authorized for detection and/or diagnosis of SARS-CoV-2 by FDA under an Emergency Use Authorization (EUA). This EUA will remain in effect (meaning this test can be used) for the duration of the COVID-19 declaration under Section 564(b)(1) of the Act, 21 U.S.C. section 360bbb-3(b)(1), unless the authorization is terminated or revoked.  Performed at [facility], [address] [place], [place] [address]          Radiology Studies: DG Chest Port 1 View  Result Date: 05/06/2021 CLINICAL DATA:  Respiratory distress and shortness of breath EXAM: PORTABLE CHEST 1 VIEW COMPARISON:   01/01/2018 FINDINGS: Dual-chamber defibrillator leads from the right. Chronic cardiomegaly. Confluent airspace disease with septal thickening. No definite pleural effusion. IMPRESSION: CHF. Electronically Signed   By: Marnee Spring M.D.   On: 05/06/2021 06:23   ECHOCARDIOGRAM COMPLETE  Result Date: 05/06/2021    ECHOCARDIOGRAM REPORT   Patient Name:   Charles Wyatt Date of Exam: 05/06/2021 Medical Rec #:  [id number]       Height:       67.0 in Accession #:    [id number]      Weight:       237.0 lb Date of Birth:  May 16, 1971        BSA:          2.174 m Patient Age:    49 years        BP:           102/78 mmHg Patient Gender: M               HR:           79 bpm. Exam Location:  Inpatient Procedure: 2D Echo, 3D Echo, Cardiac Doppler, Color Doppler and Strain Analysis STAT ECHO Indications:    Dyspnea R06.00  History:        Patient has prior history of Echocardiogram examinations, most                 recent 09/06/2017. CHF, Defibrillator, Arrythmias:Atrial                 Fibrillation; Risk Factors:Sleep Apnea. HOCM.  Sonographer:    Leta Jungling RDCS Referring Phys: [phone] HAO MENG IMPRESSIONS  1. Left ventricular ejection fraction, by estimation, is 55 to 60%. The left ventricle has normal function. The left ventricle has no regional wall motion abnormalities. There is moderate asymmetric left ventricular hypertrophy of the basal-septal segment. Left ventricular diastolic parameters are consistent with Grade III diastolic dysfunction (restrictive). Elevated left atrial pressure.  2. Right ventricular systolic function is normal. The right ventricular size is normal. There is moderately elevated pulmonary artery systolic pressure.  3. Left atrial size was severely dilated.  4. Severe mitral valve regurgitation. No evidence of mitral stenosis. There is no evidence of systolic anterior motion of the mitral leaflets. There appears to be leaflet malcoaptation. There is no LV outflow obstruction. Effective  regurgitant area is 0.6 cm, regurgitant volume 57 ml, regurgitant fraction 60%. The MR jet has a triangular, "v-wave cutoff" configuration. Pulmonary venous flow shows systolic flow reversal.  5. The aortic valve is tricuspid. Aortic valve regurgitation is not visualized. No aortic stenosis is present. FINDINGS  Left Ventricle: Left ventricular ejection fraction, by estimation, is 55 to 60%. The left ventricle has normal function. The left ventricle has no regional wall motion abnormalities. The left  ventricular internal cavity size was normal in size. There is  moderate asymmetric left ventricular hypertrophy of the basal-septal segment. Left ventricular diastolic parameters are consistent with Grade III diastolic dysfunction (restrictive). Elevated left atrial pressure. Right Ventricle: The right ventricular size is normal. No increase in right ventricular wall thickness. Right ventricular systolic function is normal. There is moderately elevated pulmonary artery systolic pressure. The tricuspid regurgitant velocity is 3.09 m/s, and with an assumed right atrial pressure of 15 mmHg, the estimated right ventricular systolic pressure is 53.2 mmHg. Left Atrium: Left atrial size was severely dilated. Right Atrium: Right atrial size was normal in size. Pericardium: There is no evidence of pericardial effusion. Mitral Valve: There is no evidence of systolic anterior motion of the mitral leaflets. There appears to be leaflet malcoaptation. There is no LV outflow obstruction. Effective regurgitant area is 0.6 cm, regurgitant volume 57 ml, regurgitant fraction 60%. The MR jet has a triangular, "v-wave cutoff" configuration. The mitral valve is abnormal. There is mild thickening of the mitral valve leaflet(s). Severe mitral valve regurgitation, with eccentric posteriorly directed jet. No evidence of mitral valve stenosis. Mitral valve area by planimetry is 8.04 cm, and by the pressure half-time method is calculated to be  6.02 cm. The mean transmitral gradient is 1.0 mmHg. MV peak gradient, 6.7 mmHg. The mean mitral valve gradient is 1.0 mmHg. Pulmonary venous flow shows systolic flow reversal. Tricuspid Valve: The tricuspid valve is normal in structure. Tricuspid valve regurgitation is trivial. Aortic Valve: The aortic valve is tricuspid. Aortic valve regurgitation is not visualized. No aortic stenosis is present. Pulmonic Valve: The pulmonic valve was normal in structure. Pulmonic valve regurgitation is trivial. Aorta: The aortic root and ascending aorta are structurally normal, with no evidence of dilitation. IAS/Shunts: No atrial level shunt detected by color flow Doppler. Additional Comments: A device lead is visualized in the right ventricle.  LEFT VENTRICLE PLAX 2D LVIDd:         4.20 cm LVIDs:         2.40 cm LV PW:         1.20 cm LV IVS:        1.90 cm LVOT diam:     2.00 cm LV SV:         40 LV SV Index:   18 LVOT Area:     3.14 cm  RIGHT VENTRICLE RV S prime:     13.70 cm/s TAPSE (M-mode): 1.4 cm LEFT ATRIUM              Index       RIGHT ATRIUM           Index LA diam:        6.80 cm  3.13 cm/m  RA Area:     18.60 cm LA Vol (A2C):   191.0 ml 87.88 ml/m RA Volume:   44.80 ml  20.61 ml/m LA Vol (A4C):   190.0 ml 87.42 ml/m LA Biplane Vol: 193.0 ml 88.80 ml/m  AORTIC VALVE LVOT Vmax:   88.65 cm/s LVOT Vmean:  59.950 cm/s LVOT VTI:    0.128 m  AORTA Ao Root diam: 2.95 cm MITRAL VALVE                 TRICUSPID VALVE MV Area (PHT):  6.02 cm     TR Peak grad:   38.2 mmHg MV Area (plan): 8.04 cm     TR Vmax:        309.00 cm/s MV  Peak grad:   6.7 mmHg MV Mean grad:   1.0 mmHg     SHUNTS MV Vmax:        1.29 m/s     Systemic VTI:  0.13 m MV Vmean:       51.5 cm/s    Systemic Diam: 2.00 cm MV Decel Time:  126 msec MR Peak grad:    62.3 mmHg MR Mean grad:    33.5 mmHg MR Vmax:         394.50 cm/s MR Vmean:        269.0 cm/s MR PISA:         8.07 cm MR PISA Eff ROA: 60 mm MR PISA Radius:  1.2 cm MV E velocity: 127.33  cm/s Mihai Croitoru MD Electronically signed by Thurmon Fair MD Signature Date/Time: 05/06/2021/2:34:34 PM    Final         Scheduled Meds: . furosemide  40 mg Intravenous BID  . metoprolol tartrate  50 mg Oral BID WC  . PARoxetine  20 mg Oral Daily  . sodium chloride flush  3 mL Intravenous Q12H  . sodium chloride flush  3 mL Intravenous Q12H   Continuous Infusions: . sodium chloride       LOS: 1 day    Time spent: 39 minutes spent on chart review, discussion with nursing staff, consultants, updating family and interview/physical exam; more than 50% of that time was spent in counseling and/or coordination of care.    Alvira Philips [place], DO [facility] Available via Epic secure chat 7am-7pm After these hours, please refer to coverage provider listed on [email or web address] 05/07/2021, 11:37 AM

## 2021-05-07 NOTE — Progress Notes (Signed)
Subjective:  Denies SSCP, palpitations or Dyspnea Slept well with 2 L's  Objective:  Vitals:   05/06/21 1800 05/06/21 1927 05/06/21 2315 05/07/21 0520  BP: 129/87 124/82 106/83 109/73  Pulse: 62 66 76 60  Resp: 16     Temp: 98.5 F (36.9 C) 98.5 F (36.9 C) 98.2 F (36.8 C) 98.6 F (37 C)  TempSrc: Oral Oral Oral Oral  SpO2: 97% 90% 99% 94%  Weight: 104 kg   103 kg  Height: 5\' 7"  (1.702 m)       Intake/Output from previous day:  Intake/Output Summary (Last 24 hours) at 05/07/2021 0744 Last data filed at 05/07/2021 0047 Gross per 24 hour  Intake 643 ml  Output 3080 ml  Net -2437 ml    Physical Exam: Affect appropriate Healthy:  appears stated age HEENT: normal Neck supple with no adenopathy JVP normal no bruits no thyromegaly Lungs basilar crackles  Heart:  S1/S2 no murmur, no rub, gallop or click AICD under right clavicle  Abdomen: benighn, BS positve, no tenderness, no AAA no bruit.  No HSM or HJR Distal pulses intact with no bruits No edema Neuro non-focal Skin warm and dry No muscular weakness   Lab Results: Basic Metabolic Panel: Recent Labs    05/06/21 0607 05/07/21 0313  NA 136 138  K 4.2 3.9  CL 104 99  CO2 19* 30  GLUCOSE 295* 132*  BUN 22* 21*  CREATININE 1.80* 1.66*  CALCIUM 8.6* 9.3   Liver Function Tests: No results for input(s): AST, ALT, ALKPHOS, BILITOT, PROT, ALBUMIN in the last 72 hours. No results for input(s): LIPASE, AMYLASE in the last 72 hours. CBC: Recent Labs    05/06/21 0607 05/07/21 0313  WBC 13.1* 8.3  NEUTROABS 9.1*  --   HGB 15.6 15.2  HCT 48.3 46.0  MCV 99.6 96.8  PLT 611* 441*   Cardiac Enzymes: No results for input(s): CKTOTAL, CKMB, CKMBINDEX, TROPONINI in the last 72 hours. BNP: Invalid input(s): POCBNP D-Dimer: No results for input(s): DDIMER in the last 72 hours. Hemoglobin A1C: Recent Labs    05/06/21 1807  HGBA1C 5.9*   Fasting Lipid Panel: No results for input(s): CHOL, HDL, LDLCALC,  TRIG, CHOLHDL, LDLDIRECT in the last 72 hours. Thyroid Function Tests: Recent Labs    05/06/21 1807  TSH 0.196*   Anemia Panel: No results for input(s): VITAMINB12, FOLATE, FERRITIN, TIBC, IRON, RETICCTPCT in the last 72 hours.  Imaging: DG Chest Port 1 View  Result Date: 05/06/2021 CLINICAL DATA:  Respiratory distress and shortness of breath EXAM: PORTABLE CHEST 1 VIEW COMPARISON:  01/01/2018 FINDINGS: Dual-chamber defibrillator leads from the right. Chronic cardiomegaly. Confluent airspace disease with septal thickening. No definite pleural effusion. IMPRESSION: CHF. Electronically Signed   By: 03/01/2018 M.D.   On: 05/06/2021 06:23   ECHOCARDIOGRAM COMPLETE  Result Date: 05/06/2021    ECHOCARDIOGRAM REPORT   Patient Name:   Charles Wyatt Date of Exam: 05/06/2021 Medical Rec #:  07/06/[id number]       Height:       67.0 in Accession #:    [id number]      Weight:       237.0 lb Date of Birth:  July 16, 1971        BSA:          2.174 m Patient Age:    50 years        BP:           102/78 mmHg Patient Gender:  M               HR:           79 bpm. Exam Location:  Inpatient Procedure: 2D Echo, 3D Echo, Cardiac Doppler, Color Doppler and Strain Analysis STAT ECHO Indications:    Dyspnea R06.00  History:        Patient has prior history of Echocardiogram examinations, most                 recent 09/06/2017. CHF, Defibrillator, Arrythmias:Atrial                 Fibrillation; Risk Factors:Sleep Apnea. HOCM.  Sonographer:    Leta Jungling RDCS Referring Phys: [phone] HAO MENG IMPRESSIONS  1. Left ventricular ejection fraction, by estimation, is 55 to 60%. The left ventricle has normal function. The left ventricle has no regional wall motion abnormalities. There is moderate asymmetric left ventricular hypertrophy of the basal-septal segment. Left ventricular diastolic parameters are consistent with Grade III diastolic dysfunction (restrictive). Elevated left atrial pressure.  2. Right ventricular systolic function  is normal. The right ventricular size is normal. There is moderately elevated pulmonary artery systolic pressure.  3. Left atrial size was severely dilated.  4. Severe mitral valve regurgitation. No evidence of mitral stenosis. There is no evidence of systolic anterior motion of the mitral leaflets. There appears to be leaflet malcoaptation. There is no LV outflow obstruction. Effective regurgitant area is 0.6 cm, regurgitant volume 57 ml, regurgitant fraction 60%. The MR jet has a triangular, "v-wave cutoff" configuration. Pulmonary venous flow shows systolic flow reversal.  5. The aortic valve is tricuspid. Aortic valve regurgitation is not visualized. No aortic stenosis is present. FINDINGS  Left Ventricle: Left ventricular ejection fraction, by estimation, is 55 to 60%. The left ventricle has normal function. The left ventricle has no regional wall motion abnormalities. The left ventricular internal cavity size was normal in size. There is  moderate asymmetric left ventricular hypertrophy of the basal-septal segment. Left ventricular diastolic parameters are consistent with Grade III diastolic dysfunction (restrictive). Elevated left atrial pressure. Right Ventricle: The right ventricular size is normal. No increase in right ventricular wall thickness. Right ventricular systolic function is normal. There is moderately elevated pulmonary artery systolic pressure. The tricuspid regurgitant velocity is 3.09 m/s, and with an assumed right atrial pressure of 15 mmHg, the estimated right ventricular systolic pressure is 53.2 mmHg. Left Atrium: Left atrial size was severely dilated. Right Atrium: Right atrial size was normal in size. Pericardium: There is no evidence of pericardial effusion. Mitral Valve: There is no evidence of systolic anterior motion of the mitral leaflets. There appears to be leaflet malcoaptation. There is no LV outflow obstruction. Effective regurgitant area is 0.6 cm, regurgitant volume 57 ml,  regurgitant fraction 60%. The MR jet has a triangular, "v-wave cutoff" configuration. The mitral valve is abnormal. There is mild thickening of the mitral valve leaflet(s). Severe mitral valve regurgitation, with eccentric posteriorly directed jet. No evidence of mitral valve stenosis. Mitral valve area by planimetry is 8.04 cm, and by the pressure half-time method is calculated to be 6.02 cm. The mean transmitral gradient is 1.0 mmHg. MV peak gradient, 6.7 mmHg. The mean mitral valve gradient is 1.0 mmHg. Pulmonary venous flow shows systolic flow reversal. Tricuspid Valve: The tricuspid valve is normal in structure. Tricuspid valve regurgitation is trivial. Aortic Valve: The aortic valve is tricuspid. Aortic valve regurgitation is not visualized. No aortic stenosis is present. Pulmonic Valve: The pulmonic  valve was normal in structure. Pulmonic valve regurgitation is trivial. Aorta: The aortic root and ascending aorta are structurally normal, with no evidence of dilitation. IAS/Shunts: No atrial level shunt detected by color flow Doppler. Additional Comments: A device lead is visualized in the right ventricle.  LEFT VENTRICLE PLAX 2D LVIDd:         4.20 cm LVIDs:         2.40 cm LV PW:         1.20 cm LV IVS:        1.90 cm LVOT diam:     2.00 cm LV SV:         40 LV SV Index:   18 LVOT Area:     3.14 cm  RIGHT VENTRICLE RV S prime:     13.70 cm/s TAPSE (M-mode): 1.4 cm LEFT ATRIUM              Index       RIGHT ATRIUM           Index LA diam:        6.80 cm  3.13 cm/m  RA Area:     18.60 cm LA Vol (A2C):   191.0 ml 87.88 ml/m RA Volume:   44.80 ml  20.61 ml/m LA Vol (A4C):   190.0 ml 87.42 ml/m LA Biplane Vol: 193.0 ml 88.80 ml/m  AORTIC VALVE LVOT Vmax:   88.65 cm/s LVOT Vmean:  59.950 cm/s LVOT VTI:    0.128 m  AORTA Ao Root diam: 2.95 cm MITRAL VALVE                 TRICUSPID VALVE MV Area (PHT):  6.02 cm     TR Peak grad:   38.2 mmHg MV Area (plan): 8.04 cm     TR Vmax:        309.00 cm/s MV Peak  grad:   6.7 mmHg MV Mean grad:   1.0 mmHg     SHUNTS MV Vmax:        1.29 m/s     Systemic VTI:  0.13 m MV Vmean:       51.5 cm/s    Systemic Diam: 2.00 cm MV Decel Time:  126 msec MR Peak grad:    62.3 mmHg MR Mean grad:    33.5 mmHg MR Vmax:         394.50 cm/s MR Vmean:        269.0 cm/s MR PISA:         8.07 cm MR PISA Eff ROA: 60 mm MR PISA Radius:  1.2 cm MV E velocity: 127.33 cm/s Mihai Croitoru MD Electronically signed by Thurmon Fair MD Signature Date/Time: 05/06/2021/2:34:34 PM    Final     Cardiac Studies:  ECG: SR p synch pacing intermittently    Telemetry: NSR   Echo: EF 55-60% septal thickness 19 mm severe MR  Medications:   . apixaban  5 mg Oral BID  . furosemide  40 mg Intravenous BID  . metoprolol tartrate  50 mg Oral BID WC  . PARoxetine  20 mg Oral Daily  . sodium chloride flush  3 mL Intravenous Q12H  . sodium chloride flush  3 mL Intravenous Q12H     . sodium chloride      Assessment/Plan:   1. Flash Pulmonary edema:  Related to diastolic dysfunction of HOCM and severe atrial-FMR.  Continue bid lasix K/Cr stable Plan on TEE and right/left heart cath tomorrow will  hold eliquis tonight and in am  2. PAF:  Multaq d/c discussed with Dr Johney FrameAllred will consider adding Norpace latter in admission ? 200 mg bid CR dosing  3. HOCM:  Family history of AICD in place no SAM/LVOT gradient  4. Anxiety:  PRN valium started on Paroxetine   Charlton Hawseter Kyli Sorter 05/07/2021, 7:44 AM

## 2021-05-08 ENCOUNTER — Encounter (HOSPITAL_COMMUNITY)
Admission: EM | Disposition: A | Discharge status: Home / Self Care | Payer: Self-pay | Source: Home / Self Care | Attending: Internal Medicine

## 2021-05-08 ENCOUNTER — Inpatient Hospital Stay (HOSPITAL_COMMUNITY): Payer: Managed Care, Other (non HMO)

## 2021-05-08 ENCOUNTER — Encounter (HOSPITAL_COMMUNITY): Payer: Self-pay | Admitting: Internal Medicine

## 2021-05-08 ENCOUNTER — Inpatient Hospital Stay (HOSPITAL_COMMUNITY): Payer: Managed Care, Other (non HMO) | Admitting: Certified Registered Nurse Anesthetist

## 2021-05-08 DIAGNOSIS — I340 Nonrheumatic mitral (valve) insufficiency: Secondary | ICD-10-CM

## 2021-05-08 HISTORY — PX: CARDIOVERSION: SHX1299

## 2021-05-08 HISTORY — PX: BUBBLE STUDY: SHX6837

## 2021-05-08 HISTORY — PX: TEE WITHOUT CARDIOVERSION: SHX5443

## 2021-05-08 LAB — BASIC METABOLIC PANEL
Anion gap: 13 (ref 5–15)
BUN: 21 mg/dL — ABNORMAL HIGH (ref 6–20)
CO2: 26 mmol/L (ref 22–32)
Calcium: 8.8 mg/dL — ABNORMAL LOW (ref 8.9–10.3)
Chloride: 97 mmol/L — ABNORMAL LOW (ref 98–111)
Creatinine, Ser: 1.75 mg/dL — ABNORMAL HIGH (ref 0.61–1.24)
GFR, Estimated: 47 mL/min — ABNORMAL LOW (ref >60)
Glucose, Bld: 132 mg/dL — ABNORMAL HIGH (ref 70–99)
Potassium: 3.4 mmol/L — ABNORMAL LOW (ref 3.5–5.1)
Sodium: 136 mmol/L (ref 135–145)

## 2021-05-08 SURGERY — RIGHT/LEFT HEART CATH AND CORONARY ANGIOGRAPHY
Anesthesia: LOCAL

## 2021-05-08 SURGERY — ECHOCARDIOGRAM, TRANSESOPHAGEAL
Anesthesia: General

## 2021-05-08 MED ORDER — PROPOFOL 10 MG/ML IV BOLUS
INTRAVENOUS | Status: DC | PRN
  Administered 2021-05-08 (×2): 30 mg via INTRAVENOUS
  Administered 2021-05-08: 20 mg via INTRAVENOUS

## 2021-05-08 MED ORDER — PHENYLEPHRINE 40 MCG/ML (10ML) SYRINGE FOR IV PUSH (FOR BLOOD PRESSURE SUPPORT)
PREFILLED_SYRINGE | INTRAVENOUS | Status: DC | PRN
  Administered 2021-05-08: 80 ug via INTRAVENOUS
  Administered 2021-05-08 (×2): 120 ug via INTRAVENOUS
  Administered 2021-05-08: 80 ug via INTRAVENOUS

## 2021-05-08 MED ORDER — POTASSIUM CHLORIDE CRYS ER 20 MEQ PO TBCR
40.0000 meq | EXTENDED_RELEASE_TABLET | Freq: Once | ORAL | Status: AC
  Administered 2021-05-08: 40 meq via ORAL
  Filled 2021-05-08: qty 2

## 2021-05-08 MED ORDER — LIDOCAINE 2% (20 MG/ML) 5 ML SYRINGE: INTRAMUSCULAR | Status: DC | PRN

## 2021-05-08 MED ORDER — LIDOCAINE HCL (PF) 2 % IJ SOLN
INTRAMUSCULAR | Status: DC | PRN
  Administered 2021-05-08: 100 mg via INTRADERMAL

## 2021-05-08 MED ORDER — PROPOFOL 500 MG/50ML IV EMUL
INTRAVENOUS | Status: DC | PRN
  Administered 2021-05-08 (×2): 100 ug/kg/min via INTRAVENOUS

## 2021-05-08 NOTE — Anesthesia Preprocedure Evaluation (Addendum)
Anesthesia Evaluation  Patient identified by MRN, date of birth, ID band Patient awake    Reviewed: Allergy & Precautions, NPO status , Patient's Chart, lab work & pertinent test results, reviewed documented beta blocker date and time   Airway Mallampati: III  TM Distance: >3 FB Neck ROM: Full    Dental  (+) Teeth Intact, Dental Advisory Given   Pulmonary shortness of breath and with exertion, sleep apnea and Continuous Positive Airway Pressure Ventilation ,  Acute hypoxic respiratory failure, POA Flash pulmonary edema 88% on RA upon EMS arrival- now net negative 2.4L w/ lasix this admission    Pulmonary exam normal breath sounds clear to auscultation       Cardiovascular pulmonary hypertension (mod pHTN)+CHF (grade 3 diastolic dysfunction, normal LVEF)  Normal cardiovascular exam+ dysrhythmias Atrial Fibrillation + Cardiac Defibrillator (hypertrophic cardiomyopathy s/p AICD) + Valvular Problems/Murmurs (severe MR, ) MR  Rhythm:Regular Rate:Normal  TTE 05/06/21: 1. Left ventricular ejection fraction, by estimation, is 55 to 60%. The  left ventricle has normal function. The left ventricle has no regional  wall motion abnormalities. There is moderate asymmetric left ventricular  hypertrophy of the basal-septal  segment. Left ventricular diastolic parameters are consistent with Grade  III diastolic dysfunction (restrictive). Elevated left atrial pressure.  2. Right ventricular systolic function is normal. The right ventricular  size is normal. There is moderately elevated pulmonary artery systolic  pressure.  3. Left atrial size was severely dilated.  4. Severe mitral valve regurgitation. No evidence of mitral stenosis.  There is no evidence of systolic anterior motion of the mitral leaflets.  There appears to be leaflet malcoaptation. There is no LV outflow  obstruction. Effective regurgitant area is  0.6 cm, regurgitant volume  57 ml, regurgitant fraction 60%. The MR jet  has a triangular, "v-wave cutoff" configuration. Pulmonary venous flow shows systolic flow reversal.  5. The aortic valve is tricuspid. Aortic valve regurgitation is not visualized. No aortic stenosis is present.    Neuro/Psych negative neurological ROS  negative psych ROS   GI/Hepatic negative GI ROS, Neg liver ROS,   Endo/Other  negative endocrine ROSObesity BMI 36  Renal/GU Renal InsufficiencyRenal diseaseCr 1.75  negative genitourinary   Musculoskeletal negative musculoskeletal ROS (+)   Abdominal (+) + obese,   Peds  Hematology negative hematology ROS (+)   Anesthesia Other Findings cardioverted on 5/13 and started back on amiodarone.  However patient reported feeling very bad after placed on this medication and was switched to Multaq on 5/27 after his follow-up visit with Dr. Graciela Husbands.    Reproductive/Obstetrics negative OB ROS                            Anesthesia Physical Anesthesia Plan  ASA: IV  Anesthesia Plan: General   Post-op Pain Management:    Induction: Intravenous  PONV Risk Score and Plan: TIVA and Treatment may vary due to age or medical condition  Airway Management Planned: Natural Airway and Mask  Additional Equipment: None  Intra-op Plan:   Post-operative Plan:   Informed Consent: I have reviewed the patients History and Physical, chart, labs and discussed the procedure including the risks, benefits and alternatives for the proposed anesthesia with the patient or authorized representative who has indicated his/her understanding and acceptance.     Dental advisory given  Plan Discussed with: CRNA  Anesthesia Plan Comments:        Anesthesia Quick Evaluation

## 2021-05-08 NOTE — Plan of Care (Signed)

## 2021-05-08 NOTE — H&P (View-Only) (Signed)
Subjective:  Better breathing Went into afib yesterday on iv amiodarone   Objective:  Vitals:   05/07/21 1642 05/07/21 2004 05/08/21 0008 05/08/21 0559  BP: 120/85 105/88 101/76 125/79  Pulse: (!) 107 93 (!) 55 (!) 121  Resp: 18   18  Temp: 98.3 F (36.8 C) 98.6 F (37 C) 97.6 F (36.4 C) 98.1 F (36.7 C)  TempSrc: Oral Oral Oral Oral  SpO2: 96% 92% 96% 94%  Weight:    101.2 kg  Height:        Intake/Output from previous day:  Intake/Output Summary (Last 24 hours) at 05/08/2021 0740 Last data filed at 05/08/2021 7209 Gross per 24 hour  Intake 150.68 ml  Output 1850 ml  Net -1699.32 ml    Physical Exam: Affect appropriate Healthy:  appears stated age HEENT: normal Neck supple with no adenopathy JVP normal no bruits no thyromegaly Lungs basilar crackles  Heart:  S1/S2 no murmur, no rub, gallop or click AICD under right clavicle  Abdomen: benighn, BS positve, no tenderness, no AAA no bruit.  No HSM or HJR Distal pulses intact with no bruits No edema Neuro non-focal Skin warm and dry No muscular weakness   Lab Results: Basic Metabolic Panel: Recent Labs    05/07/21 0313 05/08/21 0348  NA 138 136  K 3.9 3.4*  CL 99 97*  CO2 30 26  GLUCOSE 132* 132*  BUN 21* 21*  CREATININE 1.66* 1.75*  CALCIUM 9.3 8.8*   Liver Function Tests: No results for input(s): AST, ALT, ALKPHOS, BILITOT, PROT, ALBUMIN in the last 72 hours. No results for input(s): LIPASE, AMYLASE in the last 72 hours. CBC: Recent Labs    05/06/21 0607 05/07/21 0313  WBC 13.1* 8.3  NEUTROABS 9.1*  --   HGB 15.6 15.2  HCT 48.3 46.0  MCV 99.6 96.8  PLT 611* 441*   Cardiac Enzymes: No results for input(s): CKTOTAL, CKMB, CKMBINDEX, TROPONINI in the last 72 hours. BNP: Invalid input(s): POCBNP D-Dimer: No results for input(s): DDIMER in the last 72 hours. Hemoglobin A1C: Recent Labs    05/06/21 1807  HGBA1C 5.9*   Fasting Lipid Panel: No results for input(s): CHOL, HDL,  LDLCALC, TRIG, CHOLHDL, LDLDIRECT in the last 72 hours. Thyroid Function Tests: Recent Labs    05/06/21 1807  TSH 0.196*   Anemia Panel: No results for input(s): VITAMINB12, FOLATE, FERRITIN, TIBC, IRON, RETICCTPCT in the last 72 hours.  Imaging: ECHOCARDIOGRAM COMPLETE  Result Date: 05/06/2021    ECHOCARDIOGRAM REPORT   Patient Name:   KAMARRION STFORT Date of Exam: 05/06/2021 Medical Rec #:  [id number]       Height:       67.0 in Accession #:    [id number]      Weight:       237.0 lb Date of Birth:  Dec 03, 1970        BSA:          2.174 m Patient Age:    49 years        BP:           102/78 mmHg Patient Gender: M               HR:           79 bpm. Exam Location:  Inpatient Procedure: 2D Echo, 3D Echo, Cardiac Doppler, Color Doppler and Strain Analysis STAT ECHO Indications:    Dyspnea R06.00  History:        Patient has prior  history of Echocardiogram examinations, most                 recent 09/06/2017. CHF, Defibrillator, Arrythmias:Atrial                 Fibrillation; Risk Factors:Sleep Apnea. HOCM.  Sonographer:    Leta Jungling RDCS Referring Phys: [phone] HAO MENG IMPRESSIONS  1. Left ventricular ejection fraction, by estimation, is 55 to 60%. The left ventricle has normal function. The left ventricle has no regional wall motion abnormalities. There is moderate asymmetric left ventricular hypertrophy of the basal-septal segment. Left ventricular diastolic parameters are consistent with Grade III diastolic dysfunction (restrictive). Elevated left atrial pressure.  2. Right ventricular systolic function is normal. The right ventricular size is normal. There is moderately elevated pulmonary artery systolic pressure.  3. Left atrial size was severely dilated.  4. Severe mitral valve regurgitation. No evidence of mitral stenosis. There is no evidence of systolic anterior motion of the mitral leaflets. There appears to be leaflet malcoaptation. There is no LV outflow obstruction. Effective regurgitant area  is 0.6 cm, regurgitant volume 57 ml, regurgitant fraction 60%. The MR jet has a triangular, "v-wave cutoff" configuration. Pulmonary venous flow shows systolic flow reversal.  5. The aortic valve is tricuspid. Aortic valve regurgitation is not visualized. No aortic stenosis is present. FINDINGS  Left Ventricle: Left ventricular ejection fraction, by estimation, is 55 to 60%. The left ventricle has normal function. The left ventricle has no regional wall motion abnormalities. The left ventricular internal cavity size was normal in size. There is  moderate asymmetric left ventricular hypertrophy of the basal-septal segment. Left ventricular diastolic parameters are consistent with Grade III diastolic dysfunction (restrictive). Elevated left atrial pressure. Right Ventricle: The right ventricular size is normal. No increase in right ventricular wall thickness. Right ventricular systolic function is normal. There is moderately elevated pulmonary artery systolic pressure. The tricuspid regurgitant velocity is 3.09 m/s, and with an assumed right atrial pressure of 15 mmHg, the estimated right ventricular systolic pressure is 53.2 mmHg. Left Atrium: Left atrial size was severely dilated. Right Atrium: Right atrial size was normal in size. Pericardium: There is no evidence of pericardial effusion. Mitral Valve: There is no evidence of systolic anterior motion of the mitral leaflets. There appears to be leaflet malcoaptation. There is no LV outflow obstruction. Effective regurgitant area is 0.6 cm, regurgitant volume 57 ml, regurgitant fraction 60%. The MR jet has a triangular, "v-wave cutoff" configuration. The mitral valve is abnormal. There is mild thickening of the mitral valve leaflet(s). Severe mitral valve regurgitation, with eccentric posteriorly directed jet. No evidence of mitral valve stenosis. Mitral valve area by planimetry is 8.04 cm, and by the pressure half-time method is calculated to be 6.02 cm. The  mean transmitral gradient is 1.0 mmHg. MV peak gradient, 6.7 mmHg. The mean mitral valve gradient is 1.0 mmHg. Pulmonary venous flow shows systolic flow reversal. Tricuspid Valve: The tricuspid valve is normal in structure. Tricuspid valve regurgitation is trivial. Aortic Valve: The aortic valve is tricuspid. Aortic valve regurgitation is not visualized. No aortic stenosis is present. Pulmonic Valve: The pulmonic valve was normal in structure. Pulmonic valve regurgitation is trivial. Aorta: The aortic root and ascending aorta are structurally normal, with no evidence of dilitation. IAS/Shunts: No atrial level shunt detected by color flow Doppler. Additional Comments: A device lead is visualized in the right ventricle.  LEFT VENTRICLE PLAX 2D LVIDd:         4.20 cm LVIDs:  2.40 cm LV PW:         1.20 cm LV IVS:        1.90 cm LVOT diam:     2.00 cm LV SV:         40 LV SV Index:   18 LVOT Area:     3.14 cm  RIGHT VENTRICLE RV S prime:     13.70 cm/s TAPSE (M-mode): 1.4 cm LEFT ATRIUM              Index       RIGHT ATRIUM           Index LA diam:        6.80 cm  3.13 cm/m  RA Area:     18.60 cm LA Vol (A2C):   191.0 ml 87.88 ml/m RA Volume:   44.80 ml  20.61 ml/m LA Vol (A4C):   190.0 ml 87.42 ml/m LA Biplane Vol: 193.0 ml 88.80 ml/m  AORTIC VALVE LVOT Vmax:   88.65 cm/s LVOT Vmean:  59.950 cm/s LVOT VTI:    0.128 m  AORTA Ao Root diam: 2.95 cm MITRAL VALVE                 TRICUSPID VALVE MV Area (PHT):  6.02 cm     TR Peak grad:   38.2 mmHg MV Area (plan): 8.04 cm     TR Vmax:        309.00 cm/s MV Peak grad:   6.7 mmHg MV Mean grad:   1.0 mmHg     SHUNTS MV Vmax:        1.29 m/s     Systemic VTI:  0.13 m MV Vmean:       51.5 cm/s    Systemic Diam: 2.00 cm MV Decel Time:  126 msec MR Peak grad:    62.3 mmHg MR Mean grad:    33.5 mmHg MR Vmax:         394.50 cm/s MR Vmean:        269.0 cm/s MR PISA:         8.07 cm MR PISA Eff ROA: 60 mm MR PISA Radius:  1.2 cm MV E velocity: 127.33 cm/s Mihai  Croitoru MD Electronically signed by Thurmon Fair MD Signature Date/Time: 05/06/2021/2:34:34 PM    Final      Cardiac Studies:  ECG: afib nonspecific ST changes    Telemetry:Afib rates 100-120  Echo: EF 55-60% septal thickness 19 mm severe MR  Medications:    apixaban  5 mg Oral BID   furosemide  40 mg Intravenous BID   metoprolol tartrate  50 mg Oral BID WC   PARoxetine  20 mg Oral Daily   potassium chloride  40 mEq Oral Once   sodium chloride flush  3 mL Intravenous Q12H   sodium chloride flush  3 mL Intravenous Q12H      sodium chloride     sodium chloride     amiodarone 30 mg/hr (05/08/21 0111)    Assessment/Plan:   1. Flash Pulmonary edema:  Related to diastolic dysfunction of HOCM and severe  2. PAF:  Multaq d/c in setting of CHF/PE.  EP following Dr Johney Frame had suggested possibly using Norpace but patient went into afib yesterday and started on iv amiodarone. Issues with Norpace would include adjustment for Cr, availability of CR formulation, not starting in setting of CHF He has not missed any doses of eliquis and will have [facility] with TEE today  3. HOCM:  Family history of AICD in place no SAM/LVOT gradient  4. Anxiety:  PRN valium started on Paroxetine   Due to need for continuous anticoagulation and afib cath cancelled and [facility] Can consider looking at cors with CTA when back in NSR    Note he has had two TEE;s before and one complicated by aspiration pneumonia  Charlton Hawseter Han Vejar 05/08/2021, 7:40 AM   Patient ID: Dixie DialsMarshall Mcchesney, male   DOB: 04/28/1971, 50 y.o.   MRN: [id number]

## 2021-05-08 NOTE — Progress Notes (Signed)
  Echocardiogram Echocardiogram Transesophageal has been performed.  Roosvelt Maser F 05/08/2021, 12:14 PM

## 2021-05-08 NOTE — Progress Notes (Signed)
RT note. Patient will place self on home cpap when ready for bed. RT will continue to monitor. 

## 2021-05-08 NOTE — Progress Notes (Signed)
Subjective:  Better breathing Went into afib yesterday on iv amiodarone   Objective:  Vitals:   05/07/21 1642 05/07/21 2004 05/08/21 0008 05/08/21 0559  BP: 120/85 105/88 101/76 125/79  Pulse: (!) 107 93 (!) 55 (!) 121  Resp: 18   18  Temp: 98.3 F (36.8 C) 98.6 F (37 C) 97.6 F (36.4 C) 98.1 F (36.7 C)  TempSrc: Oral Oral Oral Oral  SpO2: 96% 92% 96% 94%  Weight:    101.2 kg  Height:        Intake/Output from previous day:  Intake/Output Summary (Last 24 hours) at 05/08/2021 0740 Last data filed at 05/08/2021 7209 Gross per 24 hour  Intake 150.68 ml  Output 1850 ml  Net -1699.32 ml    Physical Exam: Affect appropriate Healthy:  appears stated age HEENT: normal Neck supple with no adenopathy JVP normal no bruits no thyromegaly Lungs basilar crackles  Heart:  S1/S2 no murmur, no rub, gallop or click AICD under right clavicle  Abdomen: benighn, BS positve, no tenderness, no AAA no bruit.  No HSM or HJR Distal pulses intact with no bruits No edema Neuro non-focal Skin warm and dry No muscular weakness   Lab Results: Basic Metabolic Panel: Recent Labs    05/07/21 0313 05/08/21 0348  NA 138 136  K 3.9 3.4*  CL 99 97*  CO2 30 26  GLUCOSE 132* 132*  BUN 21* 21*  CREATININE 1.66* 1.75*  CALCIUM 9.3 8.8*   Liver Function Tests: No results for input(s): AST, ALT, ALKPHOS, BILITOT, PROT, ALBUMIN in the last 72 hours. No results for input(s): LIPASE, AMYLASE in the last 72 hours. CBC: Recent Labs    05/06/21 0607 05/07/21 0313  WBC 13.1* 8.3  NEUTROABS 9.1*  --   HGB 15.6 15.2  HCT 48.3 46.0  MCV 99.6 96.8  PLT 611* 441*   Cardiac Enzymes: No results for input(s): CKTOTAL, CKMB, CKMBINDEX, TROPONINI in the last 72 hours. BNP: Invalid input(s): POCBNP D-Dimer: No results for input(s): DDIMER in the last 72 hours. Hemoglobin A1C: Recent Labs    05/06/21 1807  HGBA1C 5.9*   Fasting Lipid Panel: No results for input(s): CHOL, HDL,  LDLCALC, TRIG, CHOLHDL, LDLDIRECT in the last 72 hours. Thyroid Function Tests: Recent Labs    05/06/21 1807  TSH 0.196*   Anemia Panel: No results for input(s): VITAMINB12, FOLATE, FERRITIN, TIBC, IRON, RETICCTPCT in the last 72 hours.  Imaging: ECHOCARDIOGRAM COMPLETE  Result Date: 05/06/2021    ECHOCARDIOGRAM REPORT   Patient Name:   KAMARRION STFORT Date of Exam: 05/06/2021 Medical Rec #:  [id number]       Height:       67.0 in Accession #:    [id number]      Weight:       237.0 lb Date of Birth:  Dec 03, 1970        BSA:          2.174 m Patient Age:    49 years        BP:           102/78 mmHg Patient Gender: M               HR:           79 bpm. Exam Location:  Inpatient Procedure: 2D Echo, 3D Echo, Cardiac Doppler, Color Doppler and Strain Analysis STAT ECHO Indications:    Dyspnea R06.00  History:        Patient has prior  history of Echocardiogram examinations, most                 recent 09/06/2017. CHF, Defibrillator, Arrythmias:Atrial                 Fibrillation; Risk Factors:Sleep Apnea. HOCM.  Sonographer:    Leta Jungling RDCS Referring Phys: [phone] HAO MENG IMPRESSIONS  1. Left ventricular ejection fraction, by estimation, is 55 to 60%. The left ventricle has normal function. The left ventricle has no regional wall motion abnormalities. There is moderate asymmetric left ventricular hypertrophy of the basal-septal segment. Left ventricular diastolic parameters are consistent with Grade III diastolic dysfunction (restrictive). Elevated left atrial pressure.  2. Right ventricular systolic function is normal. The right ventricular size is normal. There is moderately elevated pulmonary artery systolic pressure.  3. Left atrial size was severely dilated.  4. Severe mitral valve regurgitation. No evidence of mitral stenosis. There is no evidence of systolic anterior motion of the mitral leaflets. There appears to be leaflet malcoaptation. There is no LV outflow obstruction. Effective regurgitant area  is 0.6 cm, regurgitant volume 57 ml, regurgitant fraction 60%. The MR jet has a triangular, "v-wave cutoff" configuration. Pulmonary venous flow shows systolic flow reversal.  5. The aortic valve is tricuspid. Aortic valve regurgitation is not visualized. No aortic stenosis is present. FINDINGS  Left Ventricle: Left ventricular ejection fraction, by estimation, is 55 to 60%. The left ventricle has normal function. The left ventricle has no regional wall motion abnormalities. The left ventricular internal cavity size was normal in size. There is  moderate asymmetric left ventricular hypertrophy of the basal-septal segment. Left ventricular diastolic parameters are consistent with Grade III diastolic dysfunction (restrictive). Elevated left atrial pressure. Right Ventricle: The right ventricular size is normal. No increase in right ventricular wall thickness. Right ventricular systolic function is normal. There is moderately elevated pulmonary artery systolic pressure. The tricuspid regurgitant velocity is 3.09 m/s, and with an assumed right atrial pressure of 15 mmHg, the estimated right ventricular systolic pressure is 53.2 mmHg. Left Atrium: Left atrial size was severely dilated. Right Atrium: Right atrial size was normal in size. Pericardium: There is no evidence of pericardial effusion. Mitral Valve: There is no evidence of systolic anterior motion of the mitral leaflets. There appears to be leaflet malcoaptation. There is no LV outflow obstruction. Effective regurgitant area is 0.6 cm, regurgitant volume 57 ml, regurgitant fraction 60%. The MR jet has a triangular, "v-wave cutoff" configuration. The mitral valve is abnormal. There is mild thickening of the mitral valve leaflet(s). Severe mitral valve regurgitation, with eccentric posteriorly directed jet. No evidence of mitral valve stenosis. Mitral valve area by planimetry is 8.04 cm, and by the pressure half-time method is calculated to be 6.02 cm. The  mean transmitral gradient is 1.0 mmHg. MV peak gradient, 6.7 mmHg. The mean mitral valve gradient is 1.0 mmHg. Pulmonary venous flow shows systolic flow reversal. Tricuspid Valve: The tricuspid valve is normal in structure. Tricuspid valve regurgitation is trivial. Aortic Valve: The aortic valve is tricuspid. Aortic valve regurgitation is not visualized. No aortic stenosis is present. Pulmonic Valve: The pulmonic valve was normal in structure. Pulmonic valve regurgitation is trivial. Aorta: The aortic root and ascending aorta are structurally normal, with no evidence of dilitation. IAS/Shunts: No atrial level shunt detected by color flow Doppler. Additional Comments: A device lead is visualized in the right ventricle.  LEFT VENTRICLE PLAX 2D LVIDd:         4.20 cm LVIDs:  2.40 cm LV PW:         1.20 cm LV IVS:        1.90 cm LVOT diam:     2.00 cm LV SV:         40 LV SV Index:   18 LVOT Area:     3.14 cm  RIGHT VENTRICLE RV S prime:     13.70 cm/s TAPSE (M-mode): 1.4 cm LEFT ATRIUM              Index       RIGHT ATRIUM           Index LA diam:        6.80 cm  3.13 cm/m  RA Area:     18.60 cm LA Vol (A2C):   191.0 ml 87.88 ml/m RA Volume:   44.80 ml  20.61 ml/m LA Vol (A4C):   190.0 ml 87.42 ml/m LA Biplane Vol: 193.0 ml 88.80 ml/m  AORTIC VALVE LVOT Vmax:   88.65 cm/s LVOT Vmean:  59.950 cm/s LVOT VTI:    0.128 m  AORTA Ao Root diam: 2.95 cm MITRAL VALVE                 TRICUSPID VALVE MV Area (PHT):  6.02 cm     TR Peak grad:   38.2 mmHg MV Area (plan): 8.04 cm     TR Vmax:        309.00 cm/s MV Peak grad:   6.7 mmHg MV Mean grad:   1.0 mmHg     SHUNTS MV Vmax:        1.29 m/s     Systemic VTI:  0.13 m MV Vmean:       51.5 cm/s    Systemic Diam: 2.00 cm MV Decel Time:  126 msec MR Peak grad:    62.3 mmHg MR Mean grad:    33.5 mmHg MR Vmax:         394.50 cm/s MR Vmean:        269.0 cm/s MR PISA:         8.07 cm MR PISA Eff ROA: 60 mm MR PISA Radius:  1.2 cm MV E velocity: 127.33 cm/s Mihai  Croitoru MD Electronically signed by Thurmon Fair MD Signature Date/Time: 05/06/2021/2:34:34 PM    Final      Cardiac Studies:  ECG: afib nonspecific ST changes    Telemetry:Afib rates 100-120  Echo: EF 55-60% septal thickness 19 mm severe MR  Medications:    apixaban  5 mg Oral BID   furosemide  40 mg Intravenous BID   metoprolol tartrate  50 mg Oral BID WC   PARoxetine  20 mg Oral Daily   potassium chloride  40 mEq Oral Once   sodium chloride flush  3 mL Intravenous Q12H   sodium chloride flush  3 mL Intravenous Q12H      sodium chloride     sodium chloride     amiodarone 30 mg/hr (05/08/21 0111)    Assessment/Plan:   1. Flash Pulmonary edema:  Related to diastolic dysfunction of HOCM and severe  2. PAF:  Multaq d/c in setting of CHF/PE.  EP following Dr Johney Frame had suggested possibly using Norpace but patient went into afib yesterday and started on iv amiodarone. Issues with Norpace would include adjustment for Cr, availability of CR formulation, not starting in setting of CHF He has not missed any doses of eliquis and will have [facility] with TEE today  3. HOCM:  Family history of AICD in place no SAM/LVOT gradient  4. Anxiety:  PRN valium started on Paroxetine   Due to need for continuous anticoagulation and afib cath cancelled and will have TEE / DCC today Can consider looking at cors with CTA when back in NSR    Note he has had two TEE;s before and one complicated by aspiration pneumonia  Jeannetta Cerutti 05/08/2021, 7:40 AM   Patient ID: Charles Wyatt, male   DOB: 09/11/1971, 49 y.o.   MRN: [id number]  

## 2021-05-08 NOTE — Interval H&P Note (Signed)
History and Physical Interval Note:  05/08/2021 8:52 AM  Charles Wyatt  has presented today for surgery, with the diagnosis of MITRAL VALVE REGURGITATION.  The various methods of treatment have been discussed with the patient and family. After consideration of risks, benefits and other options for treatment, the patient has consented to  Procedure(s): TRANSESOPHAGEAL ECHOCARDIOGRAM (TEE) (N/A) CARDIOVERSION (N/A) as a surgical intervention.  The patient's history has been reviewed, patient examined, no change in status, stable for surgery.  I have reviewed the patient's chart and labs.  Questions were answered to the patient's satisfaction.     Parke Poisson

## 2021-05-08 NOTE — Transfer of Care (Signed)
Immediate Anesthesia Transfer of Care Note  Patient: Charles Wyatt  Procedure(s) Performed: TRANSESOPHAGEAL ECHOCARDIOGRAM (TEE) CARDIOVERSION BUBBLE STUDY  Patient Location: PACU  Anesthesia Type:MAC  Level of Consciousness: awake, alert  and oriented  Airway & Oxygen Therapy: Patient Spontanous Breathing  Post-op Assessment: Report given to RN and Post -op Vital signs reviewed and stable  Post vital signs: Reviewed and stable  Last Vitals:  Vitals Value Taken Time  BP 101/67   Temp    Pulse 62   Resp 23 05/08/21 1008  SpO2 94%   Vitals shown include unvalidated device data.  Last Pain:  Vitals:   05/08/21 0833  TempSrc: Oral  PainSc: 0-No pain      Patients Stated Pain Goal: 0 (66/06/30 1601)  Complications: No notable events documented.

## 2021-05-08 NOTE — Progress Notes (Addendum)
PROGRESS NOTE    Charles Wyatt  JJK:[id number] DOB: [birth year] DOA: 05/06/2021 PCP: Sharlene Dory, DO    Brief Narrative:  Charles Wyatt is a 50 year old male with past medical history significant for hypertrophic cardiomyopathy s/p AICD, paroxysmal atrial fibrillation on anticoagulation, OSA on CPAP, obesity who presents to [facility], ED on 6/7 with acute shortness of breath.  Patient reports awoken from sleep around 4 AM with difficulty breathing with associated cough with blood-tinged sputum.  Patient also endorses weight loss of approximately 14 pounds over the last month with dietary changes.  Patient denies any fever, no chest pain, no orthopnea, no nausea/vomiting/diarrhea.  Patient was seen by his PCP yesterday due to his progressive anxiety and being unable to sleep at night.  Patient was found to be in atrial fibrillation and was cardioverted on 5/13 and started back on amiodarone.  However patient reported feeling very bad after placed on this medication and was switched to Multaq on 5/27 after his follow-up visit with Dr. Graciela Husbands.  Upon EMS arrival, patient was noted to have O2 saturations of 88% on room air was placed on nonrebreather and subsequently switched to CPAP.  He was given 2 sublingual nitroglycerin tablets and was noted to be in atrial fibrillation.  In the ED, temperature 97.8 F, HR 88, RR 28, BP 137/81, SPO2 88% on room air, with improvement on BiPAP 99-100%.  Labs significant for WBC 13.1, platelets 611, BUN 22, creatinine 1.8, glucose 295, BNP 940.4, and high-sensitivity troponin 22.  Chest x-ray was consistent with congestive heart failure.  COVID-19 and influenza screening were both negative.  Patient was given 20 mg of Lasix IV.    Cardiology was consulted.  TRH called to admit.   Assessment & Plan:   Principal Problem:   Acute respiratory failure with hypoxia (HCC) Active Problems:   OBESITY   Hypertrophic cardiomyopathy (HCC)   Implantable  cardioverter-defibrillator (ICD) in situ   PAF (paroxysmal atrial fibrillation) (HCC)   Acute kidney injury superimposed on CKD (HCC)   Thrombocytosis   Acute systolic CHF (congestive heart failure) (HCC)   Leukocytosis   Elevated troponin   Acute hypoxic respiratory failure, POA Flash pulmonary edema Acute on chronic diastolic congestive heart failure Hypertrophic cardiomyopathy s/p AICD Severe mitral regurgitation Patient presenting with acute onset shortness of breath, noted to have O2 saturations 88% on room air.  Chest x-ray significant for pulmonary edema.  BNP elevated 940.  Patient was initially placed on BiPAP in the ED with decreased work of breathing and now weaned down to nasal cannula.  TTE with LVEF 55-60%, grade 3 diastolic dysfunction, LA severely dilated, severe MR, MR leaflets malcoaptation.  --Cardiology following, appreciate assistance --net negative 1.7L past 24 hours and net negative 4.1L since admission --Furosemide 40 mg IV q12h --Metoprolol tartrate 50 mg p.o. twice daily --Strict I's and O's and daily weights --Follow BMP daily --Continue monitor on telemetry --Cardiology considering cardiac CTA, possibly outpatient --EP to consider MVR/MAZE at some point  Elevated troponin High-sensitivity troponin 22>29.  Etiology likely secondary to type II demand ischemia in the setting of pulmonary edema/volume overload. 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  Patient presenting with a creatinine elevated to 1.8. --Cr 1.8>1.66>1.75 --Continues on IV diuresis as above --Avoid nephrotoxins, renally dose all medications --BMP daily  Paroxysmal atrial fibrillation Recently attempted cardioversion on 5/13.  CHA2DS2-VASc score = 1.  Underwent TEE with [facility] on 05/08/2021 that was successful. --Continues  on amiodarone drip --Eliquis --Continue monitor on telemetry  Anxiety Patiently recently been seen by PCP  in regards to anxiety and was recommended started on Paxil and hydroxyzine as needed.  These medications have not been started yet outpatient. --Paroxetine 20 mg p.o. daily --Hydroxyzine every 8 hours as needed anxiety --Ativan 0.25 mg p.o. daily anxiety not relieved with hydroxyzine  OSA --Continue nocturnal CPAP  Obesity Body mass index is 34.96 kg/m.  Discussed with patient needs for aggressive lifestyle changes/weight loss as this complicates all facets of care.  Reports recent 14 pound weight loss after dietary changes.  Outpatient follow-up with PCP.     DVT prophylaxis:  apixaban (ELIQUIS) tablet 5 mg    Code Status: Full Code Family Communication: Updated family present at bedside this morning.  Disposition Plan:  Level of care: Progressive Status is: Inpatient  Remains inpatient appropriate because:Ongoing diagnostic testing needed not appropriate for outpatient work up, Unsafe d/c plan, IV treatments appropriate due to intensity of illness or inability to take PO and Inpatient level of care appropriate due to severity of illness   Dispo: The patient is from: Home              Anticipated d/c is to: Home              Patient currently is not medically stable to d/c.   Difficult to place patient No  Consultants:  Cardiology/electrophysiology  Procedures:  TTE TEE/right/left heart catheterization: Pending  Antimicrobials:  None   Subjective: Patient seen examined bedside, sitting on edge of bed.  Just returned back from TEE and cardioversion.  Reports sleep better overnight.  Continues on amiodarone drip.  No other complaints or concerns at this time.  Updated family present at bedside. Denies headache, no fever/chills/night sweats, no dizziness, no chest pain, no palpitations, no nausea/vomiting/diarrhea, no abdominal pain, no weakness, no fatigue, no paresthesias.  No acute events overnight per nursing staff.  Objective: Vitals:   05/08/21 1007 05/08/21 1015  05/08/21 1020 05/08/21 1040  BP: 101/67 118/83 129/79 111/80  Pulse:  60 64 64  Resp: Temp: 98.7 F (37.1 C)   98.3 F (36.8 C)  TempSrc: Oral   Oral  SpO2: 94% 93% 99% 97%  Weight:      Height:        Intake/Output Summary (Last 24 hours) at 05/08/2021 1207 Last data filed at 05/08/2021 1009 Gross per 24 hour  Intake 1186.76 ml  Output 900 ml  Net 286.76 ml   Filed Weights   05/06/21 1800 05/07/21 0520 05/08/21 0559  Weight: 104 kg 103 kg 101.2 kg    Examination:  General exam: Appears calm and comfortable  Respiratory system: Breath sounds slightly decreased bilateral bases with mild crackles, no wheezes, normal respiratory effort, on room air at rest Cardiovascular system: S1 & S2 heard, RRR. No JVD, murmurs, rubs, gallops or clicks. No pedal edema. Gastrointestinal system: Abdomen is nondistended, soft and nontender. No organomegaly or masses felt. Normal bowel sounds heard. Central nervous system: Alert and oriented. No focal neurological deficits. Extremities: Symmetric 5 x 5 power. Skin: No rashes, lesions or ulcers Psychiatry: Judgement and insight appear normal. Mood & affect appropriate.     Data Reviewed: I have personally reviewed following labs and imaging studies  CBC: Recent Labs  Lab 05/06/21 0607 05/07/21 0313  WBC 13.1* 8.3  NEUTROABS 9.1*  --   HGB 15.6 15.2  HCT 48.3 46.0  MCV 99.6 96.8  PLT 611* 441*   Basic Metabolic Panel: Recent Labs  Lab 05/06/21 0607 05/07/21 0313 05/08/21 0348  NA 136 138 136  K 4.2 3.9 3.4*  CL 104 99 97*  CO2 19* 30 26  GLUCOSE 295* 132* 132*  BUN 22* 21* 21*  CREATININE 1.80* 1.66* 1.75*  CALCIUM 8.6* 9.3 8.8*   GFR: Estimated Creatinine Clearance: 58.5 mL/min (A) (by C-G formula based on SCr of 1.75 mg/dL (H)). Liver Function Tests: No results for input(s): AST, ALT, ALKPHOS, BILITOT, PROT, ALBUMIN in the last 168 hours. No results for input(s): LIPASE, AMYLASE in the last 168 hours. No  results for input(s): AMMONIA in the last 168 hours. Coagulation Profile: No results for input(s): INR, PROTIME in the last 168 hours. Cardiac Enzymes: No results for input(s): CKTOTAL, CKMB, CKMBINDEX, TROPONINI in the last 168 hours. BNP (last 3 results) No results for input(s): PROBNP in the last 8760 hours. HbA1C: Recent Labs    05/06/21 1807  HGBA1C 5.9*   CBG: Recent Labs  Lab 05/06/21 1141  GLUCAP 96   Lipid Profile: No results for input(s): CHOL, HDL, LDLCALC, TRIG, CHOLHDL, LDLDIRECT in the last 72 hours. Thyroid Function Tests: Recent Labs    05/06/21 1807  TSH 0.196*   Anemia Panel: No results for input(s): VITAMINB12, FOLATE, FERRITIN, TIBC, IRON, RETICCTPCT in the last 72 hours. Sepsis Labs: No results for input(s): PROCALCITON, LATICACIDVEN in the last 168 hours.  Recent Results (from the past 240 hour(s))  Resp Panel by RT-PCR (Flu A&B, Covid) Nasopharyngeal Swab     Status: None   Collection Time: 05/06/21  6:24 AM   Specimen: Nasopharyngeal Swab; Nasopharyngeal(NP) swabs in vial transport medium  Result Value Ref Range Status   SARS Coronavirus 2 by RT PCR NEGATIVE NEGATIVE Final    Comment: (NOTE) SARS-CoV-2 target nucleic acids are NOT DETECTED.  The SARS-CoV-2 RNA is generally detectable in upper respiratory specimens during the acute phase of infection. The lowest concentration of SARS-CoV-2 viral copies this assay can detect is 138 copies/mL. A negative result does not preclude SARS-Cov-2 infection and should not be used as the sole basis for treatment or other patient management decisions. A negative result may occur with  improper specimen collection/handling, submission of specimen other than nasopharyngeal swab, presence of viral mutation(s) within the areas targeted by this assay, and inadequate number of viral copies(<138 copies/mL). A negative result must be combined with clinical observations, patient history, and  epidemiological information. The expected result is Negative.  Fact Sheet for Patients:  BloggerCourse.com  Fact Sheet for Healthcare Providers:  SeriousBroker.it  This test is no t yet approved or cleared by the [place] FDA and  has been authorized for detection and/or diagnosis of SARS-CoV-2 by FDA under an Emergency Use Authorization (EUA). This EUA will remain  in effect (meaning this test can be used) for the duration of the COVID-19 declaration under Section 564(b)(1) of the Act, 21 U.S.C.section 360bbb-3(b)(1), unless the authorization is terminated  or revoked sooner.       Influenza A by PCR NEGATIVE NEGATIVE Final   Influenza B by PCR NEGATIVE NEGATIVE Final    Comment: (NOTE) The Xpert Xpress SARS-CoV-2/FLU/RSV plus assay is intended as an aid in the diagnosis of influenza from Nasopharyngeal swab specimens and should not be used as a sole basis for treatment. Nasal washings and aspirates are unacceptable for Xpert Xpress SARS-CoV-2/FLU/RSV testing.  Fact Sheet for Patients: BloggerCourse.com  Fact Sheet for Healthcare Providers: SeriousBroker.it  This test  is not yet approved or cleared by the [place] and has been authorized for detection and/or diagnosis of SARS-CoV-2 by FDA under an Emergency Use Authorization (EUA). This EUA will remain in effect (meaning this test can be used) for the duration of the COVID-19 declaration under Section 564(b)(1) of the Act, 21 U.S.C. section 360bbb-3(b)(1), unless the authorization is terminated or revoked.  Performed at [facility], [address], [place], [place] [address]          Radiology Studies: ECHOCARDIOGRAM COMPLETE  Result Date: 05/06/2021    ECHOCARDIOGRAM REPORT   Patient Name:   Charles Wyatt Date of Exam: 05/06/2021 Medical Rec #:  [id number]       Height:       67.0 in Accession #:     [id number]      Weight:       237.0 lb Date of Birth:  Dec 13, 1970        BSA:          2.174 m Patient Age:    49 years        BP:           102/78 mmHg Patient Gender: M               HR:           79 bpm. Exam Location:  Inpatient Procedure: 2D Echo, 3D Echo, Cardiac Doppler, Color Doppler and Strain Analysis STAT ECHO Indications:    Dyspnea R06.00  History:        Patient has prior history of Echocardiogram examinations, most                 recent 09/06/2017. CHF, Defibrillator, Arrythmias:Atrial                 Fibrillation; Risk Factors:Sleep Apnea. HOCM.  Sonographer:    Leta Jungling RDCS Referring Phys: [phone] HAO MENG IMPRESSIONS  1. Left ventricular ejection fraction, by estimation, is 55 to 60%. The left ventricle has normal function. The left ventricle has no regional wall motion abnormalities. There is moderate asymmetric left ventricular hypertrophy of the basal-septal segment. Left ventricular diastolic parameters are consistent with Grade III diastolic dysfunction (restrictive). Elevated left atrial pressure.  2. Right ventricular systolic function is normal. The right ventricular size is normal. There is moderately elevated pulmonary artery systolic pressure.  3. Left atrial size was severely dilated.  4. Severe mitral valve regurgitation. No evidence of mitral stenosis. There is no evidence of systolic anterior motion of the mitral leaflets. There appears to be leaflet malcoaptation. There is no LV outflow obstruction. Effective regurgitant area is 0.6 cm, regurgitant volume 57 ml, regurgitant fraction 60%. The MR jet has a triangular, "v-wave cutoff" configuration. Pulmonary venous flow shows systolic flow reversal.  5. The aortic valve is tricuspid. Aortic valve regurgitation is not visualized. No aortic stenosis is present. FINDINGS  Left Ventricle: Left ventricular ejection fraction, by estimation, is 55 to 60%. The left ventricle has normal function. The left ventricle has no regional  wall motion abnormalities. The left ventricular internal cavity size was normal in size. There is  moderate asymmetric left ventricular hypertrophy of the basal-septal segment. Left ventricular diastolic parameters are consistent with Grade III diastolic dysfunction (restrictive). Elevated left atrial pressure. Right Ventricle: The right ventricular size is normal. No increase in right ventricular wall thickness. Right ventricular systolic function is normal. There is moderately elevated pulmonary artery systolic pressure. The tricuspid regurgitant velocity is  3.09 m/s, and with an assumed right atrial pressure of 15 mmHg, the estimated right ventricular systolic pressure is 53.2 mmHg. Left Atrium: Left atrial size was severely dilated. Right Atrium: Right atrial size was normal in size. Pericardium: There is no evidence of pericardial effusion. Mitral Valve: There is no evidence of systolic anterior motion of the mitral leaflets. There appears to be leaflet malcoaptation. There is no LV outflow obstruction. Effective regurgitant area is 0.6 cm, regurgitant volume 57 ml, regurgitant fraction 60%. The MR jet has a triangular, "v-wave cutoff" configuration. The mitral valve is abnormal. There is mild thickening of the mitral valve leaflet(s). Severe mitral valve regurgitation, with eccentric posteriorly directed jet. No evidence of mitral valve stenosis. Mitral valve area by planimetry is 8.04 cm, and by the pressure half-time method is calculated to be 6.02 cm. The mean transmitral gradient is 1.0 mmHg. MV peak gradient, 6.7 mmHg. The mean mitral valve gradient is 1.0 mmHg. Pulmonary venous flow shows systolic flow reversal. Tricuspid Valve: The tricuspid valve is normal in structure. Tricuspid valve regurgitation is trivial. Aortic Valve: The aortic valve is tricuspid. Aortic valve regurgitation is not visualized. No aortic stenosis is present. Pulmonic Valve: The pulmonic valve was normal in structure. Pulmonic  valve regurgitation is trivial. Aorta: The aortic root and ascending aorta are structurally normal, with no evidence of dilitation. IAS/Shunts: No atrial level shunt detected by color flow Doppler. Additional Comments: A device lead is visualized in the right ventricle.  LEFT VENTRICLE PLAX 2D LVIDd:         4.20 cm LVIDs:         2.40 cm LV PW:         1.20 cm LV IVS:        1.90 cm LVOT diam:     2.00 cm LV SV:         40 LV SV Index:   18 LVOT Area:     3.14 cm  RIGHT VENTRICLE RV S prime:     13.70 cm/s TAPSE (M-mode): 1.4 cm LEFT ATRIUM              Index       RIGHT ATRIUM           Index LA diam:        6.80 cm  3.13 cm/m  RA Area:     18.60 cm LA Vol (A2C):   191.0 ml 87.88 ml/m RA Volume:   44.80 ml  20.61 ml/m LA Vol (A4C):   190.0 ml 87.42 ml/m LA Biplane Vol: 193.0 ml 88.80 ml/m  AORTIC VALVE LVOT Vmax:   88.65 cm/s LVOT Vmean:  59.950 cm/s LVOT VTI:    0.128 m  AORTA Ao Root diam: 2.95 cm MITRAL VALVE                 TRICUSPID VALVE MV Area (PHT):  6.02 cm     TR Peak grad:   38.2 mmHg MV Area (plan): 8.04 cm     TR Vmax:        309.00 cm/s MV Peak grad:   6.7 mmHg MV Mean grad:   1.0 mmHg     SHUNTS MV Vmax:        1.29 m/s     Systemic VTI:  0.13 m MV Vmean:       51.5 cm/s    Systemic Diam: 2.00 cm MV Decel Time:  126 msec MR Peak grad:    62.3 mmHg MR  Mean grad:    33.5 mmHg MR Vmax:         394.50 cm/s MR Vmean:        269.0 cm/s MR PISA:         8.07 cm MR PISA Eff ROA: 60 mm MR PISA Radius:  1.2 cm MV E velocity: 127.33 cm/s Mihai Croitoru MD Electronically signed by Thurmon FairMihai Croitoru MD Signature Date/Time: 05/06/2021/2:34:34 PM    Final          Scheduled Meds:  apixaban  5 mg Oral BID   furosemide  40 mg Intravenous BID   metoprolol tartrate  50 mg Oral BID WC   PARoxetine  20 mg Oral Daily   sodium chloride flush  3 mL Intravenous Q12H   sodium chloride flush  3 mL Intravenous Q12H   Continuous Infusions:  sodium chloride     amiodarone 30 mg/hr (05/08/21 1001)      LOS: 2 days    Time spent: 39 minutes spent on chart review, discussion with nursing staff, consultants, updating family and interview/physical exam; more than 50% of that time was spent in counseling and/or coordination of care.    Alvira PhilipsEric J [place], DO [facility] Available via Epic secure chat 7am-7pm After these hours, please refer to coverage provider listed on [email or web address] 05/08/2021, 12:07 PM

## 2021-05-08 NOTE — Progress Notes (Addendum)
sd  Progress Note  Patient Name: Charles Wyatt Date of Encounter: 05/08/2021  [facility] Cardiologist: [name], MD  EP: Dr. Graciela Husbands  Subjective   I'm OK, breathing better.  He is heading down for his TEE/DCCV  Inpatient Medications    Scheduled Meds:  apixaban  5 mg Oral BID   furosemide  40 mg Intravenous BID   metoprolol tartrate  50 mg Oral BID WC   PARoxetine  20 mg Oral Daily   potassium chloride  40 mEq Oral Once   sodium chloride flush  3 mL Intravenous Q12H   sodium chloride flush  3 mL Intravenous Q12H   Continuous Infusions:  sodium chloride     sodium chloride     amiodarone 30 mg/hr (05/08/21 0111)   PRN Meds: sodium chloride, acetaminophen, hydrOXYzine, LORazepam, ondansetron (ZOFRAN) IV, sodium chloride flush   Vital Signs    Vitals:   05/07/21 1642 05/07/21 2004 05/08/21 0008 05/08/21 0559  BP: 120/85 105/88 101/76 125/79  Pulse: (!) 107 93 (!) 55 (!) 121  Resp: 18   18  Temp: 98.3 F (36.8 C) 98.6 F (37 C) 97.6 F (36.4 C) 98.1 F (36.7 C)  TempSrc: Oral Oral Oral Oral  SpO2: 96% 92% 96% 94%  Weight:    101.2 kg  Height:        Intake/Output Summary (Last 24 hours) at 05/08/2021 0747 Last data filed at 05/08/2021 0603 Gross per 24 hour  Intake 150.68 ml  Output 1850 ml  Net -1699.32 ml   Last 3 Weights 05/08/2021 05/07/2021 05/06/2021  Weight (lbs) 223 lb 3.2 oz 227 lb 229 lb 3.2 oz  Weight (kg) 101.243 kg 102.967 kg 103.964 kg      Telemetry    AFib/flutter 90's-110s - Personally Reviewed  ECG    Yesterday with coarse AFib vs atypical flutter, 112bpm,  - Personally Reviewed  Physical Exam   GEN: No acute distress.   Neck: No JVD Cardiac: irreg-irreg, 2/6 SM, rubs, or gallops.  Respiratory: CTA b/l GI: Soft, nontender, non-distended  MS: No edema; No deformity. Neuro:  Nonfocal  Psych: Normal affect   Labs    High Sensitivity Troponin:   Recent Labs  Lab 05/06/21 0607 05/06/21 0807  TROPONINIHS 22* 29*       Chemistry Recent Labs  Lab 05/06/21 0607 05/07/21 0313 05/08/21 0348  NA 136 138 136  K 4.2 3.9 3.4*  CL 104 99 97*  CO2 19* 30 26  GLUCOSE 295* 132* 132*  BUN 22* 21* 21*  CREATININE 1.80* 1.66* 1.75*  CALCIUM 8.6* 9.3 8.8*  GFRNONAA 46* 51* 47*  ANIONGAP 13 9 13      Hematology Recent Labs  Lab 05/06/21 0607 05/07/21 0313  WBC 13.1* 8.3  RBC 4.85 4.75  HGB 15.6 15.2  HCT 48.3 46.0  MCV 99.6 96.8  MCH 32.2 32.0  MCHC 32.3 33.0  RDW 12.8 12.8  PLT 611* 441*    BNP Recent Labs  Lab 05/06/21 0607  BNP 940.4*     DDimer No results for input(s): DDIMER in the last 168 hours.   Radiology       Cardiac Studies    05/06/21: TTE IMPRESSIONS   1. Left ventricular ejection fraction, by estimation, is 55 to 60%. The  left ventricle has normal function. The left ventricle has no regional  wall motion abnormalities. There is moderate asymmetric left ventricular  hypertrophy of the basal-septal  segment. Left ventricular diastolic parameters are consistent with Grade  III diastolic dysfunction (restrictive). Elevated left atrial pressure.   2. Right ventricular systolic function is normal. The right ventricular  size is normal. There is moderately elevated pulmonary artery systolic  pressure.   3. Left atrial size was severely dilated.   4. Severe mitral valve regurgitation. No evidence of mitral stenosis.  There is no evidence of systolic anterior motion of the mitral leaflets.  There appears to be leaflet malcoaptation. There is no LV outflow  obstruction. Effective regurgitant area is  0.6 cm, regurgitant volume 57 ml, regurgitant fraction 60%. The MR jet  has a triangular, "v-wave cutoff" configuration. Pulmonary venous flow  shows systolic flow reversal.   5. The aortic valve is tricuspid. Aortic valve regurgitation is not  visualized. No aortic stenosis is present.    09/06/2017: TTE Study Conclusions  - Left ventricle: LVEF is approximately 50% with  hypokinesis of the    base/mid inferior and inferoseptal walls. The cavity size was    normal. Wall thickness was increased in a pattern of severe LVH.  - Mitral valve: There was mild regurgitation.  - Left atrium: The atrium was moderately to severely dilated.  - Pericardium, extracardiac: A trivial pericardial effusion was    identified.    08/20/2017: TEE Study Conclusions  - Left ventricle: There was severe asymmetric hypertrophy of the    septum. Systolic function was normal. The estimated ejection    fraction was in the range of 50% to 55%. Wall motion was normal;    there were no regional wall motion abnormalities.  - Aortic valve: Structurally normal valve. Trileaflet; normal    thickness leaflets.  - Mitral valve: There was moderate to severe regurgitation directed    eccentrically likely from annular dilatation. No evidence of SAM.    The flow wraps around the LA with intermittent flow in the    pulmonary vein.  - Left atrium: The atrium was massively dilated. No evidence of    thrombus in the atrial cavity or appendage. There was    mildintermittent spontaneous echo contrast (&quot;smoke&quot;) in the    cavity. The appendage was morphologically a left appendage,    multilobulated, and of normal size. Emptying velocity was mildly    reduced.  - Right atrium: No evidence of thrombus in the atrial cavity or    appendage.  - Atrial septum: The septum bowed from left to right, consistent    with increased left atrial pressure.  - Pulmonic valve: No evidence of vegetation. There was trivial    regurgitation.   Patient Profile     50 y.o. male with a hx of HCM, ICD implanted originally 2011 (in setting of a family history of sudden death and significant septal thickness and hyperenhancing on cardiac MRI), Afib, VT    Device information MDT dual chamber ICD implanted 08/20/2010, gen change 11/29/2017 + appropriate therapies VT 2017 (in setting of weight loss aid/stimulant  and off his BB) + inappropriate shock for AFib w/RVR   AAD 2018 Amiodarone for AFib > d/c Nov 2021 given youn gage and potential side effects  resumed may 2022 with recurrent AFib Amiodarone Stopped may 22 with intolerance  > started on Multaq AF history Diagnosed 2014  Admitted with flash p.edema, new HF exacerbation  Assessment & Plan    1. 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 Admitted with HF exacerbation 3.  preserved LVEF, severe MR , severe LA  dilation, 33mm, on echo this admission, no obstruction     Cumulative neg     Weight down 6kg     Creat wax/wane, up from baseline still   3. Paroxysmal AFIb     CHA2DS2Vasc is one > 2 with CHF     Rates somewhat improved on amio         Planned for TEE.DCCV today      D/w Dr. Johney Frame yesterday, plan for amiodarone as bridge until MVR (+MAZE) can be done   For questions or updates, please contact [facility] Please consult [email or web address] for contact info under        Signed, Sheilah Pigeon, PA-C  05/08/2021, 7:47 AM      I have seen, examined the patient, and reviewed the above assessment and plan.  Changes to above are made where necessary.  On exam, iRRR.  Back in afib.  Planned for TEE guided cardioversion this am amiodarone thereafter.  Given massive LA enlargement and young age, I would advise MAZE and MV repair consideration.  Not an AF ablation candidate.  Co Sign: Hillis Range, MD

## 2021-05-08 NOTE — CV Procedure (Signed)
INDICATIONS: Afib, MR  PROCEDURE:   Informed consent was obtained prior to the procedure. The risks, benefits and alternatives for the procedure were discussed and the patient comprehended these risks.  Risks include, but are not limited to, cough, sore throat, vomiting, nausea, somnolence, esophageal and stomach trauma or perforation, bleeding, low blood pressure, aspiration, pneumonia, infection, trauma to the teeth and death.    After a procedural time-out, the oropharynx was anesthetized with 20% benzocaine spray.   During this procedure the patient was administered propofol per anesthesia.  The patient's heart rate, blood pressure, and oxygen saturation were monitored continuously during the procedure. The period of conscious sedation was 40 minutes, of which I was present face-to-face 100% of this time.  The transesophageal probe was inserted in the esophagus and stomach without difficulty and multiple views were obtained.  The patient was kept under observation until the patient left the procedure room.  The patient left the procedure room in stable condition.   Agitated microbubble saline contrast was administered.  COMPLICATIONS:    There were no immediate complications.  FINDINGS:   FORMAL ECHOCARDIOGRAM REPORT PENDING No LA appendage thrombus. Mild-moderate MR with no SAM. No Pvein reversals. Borderline MV bowing without definite prolapse.  LVEF 55-60% Severe basal septal hypertrophy without color flow acceleration to suggest LVOT obstruction.  Mild TR Trivial AI.    RECOMMENDATIONS:   Proceed to cardioversion.  Procedure: Electrical Cardioversion Indications:  Atrial Fibrillation  Procedure Details:  Consent: Risks of procedure as well as the alternatives and risks of each were explained to the (patient/caregiver).  Consent for procedure obtained.  Time Out: Verified patient identification, verified procedure, site/side was marked, verified correct patient  position, special equipment/implants available, medications/allergies/relevent history reviewed, required imaging and test results available. PERFORMED.  Patient placed on cardiac monitor, pulse oximetry, supplemental oxygen as necessary.  Sedation given:  propofol per anesthesia Pacer pads placed anterior and posterior chest.  Cardioverted 1 time(s).  Cardioversion with synchronized biphasic 120J shock.  Evaluation: Findings: Post procedure EKG shows: NSR Complications: None Patient did tolerate procedure well.  Time Spent Directly with the Patient:  75  minutes   Jadesola Poynter A Jacques Navy 05/08/2021, 10:07 AM

## 2021-05-09 ENCOUNTER — Encounter (HOSPITAL_COMMUNITY): Payer: Self-pay | Admitting: Internal Medicine

## 2021-05-09 LAB — BASIC METABOLIC PANEL
Anion gap: 12 (ref 5–15)
BUN: 27 mg/dL — ABNORMAL HIGH (ref 6–20)
CO2: 27 mmol/L (ref 22–32)
Calcium: 9 mg/dL (ref 8.9–10.3)
Chloride: 97 mmol/L — ABNORMAL LOW (ref 98–111)
Creatinine, Ser: 1.97 mg/dL — ABNORMAL HIGH (ref 0.61–1.24)
GFR, Estimated: 41 mL/min — ABNORMAL LOW (ref >60)
Glucose, Bld: 117 mg/dL — ABNORMAL HIGH (ref 70–99)
Potassium: 3.5 mmol/L (ref 3.5–5.1)
Sodium: 136 mmol/L (ref 135–145)

## 2021-05-09 LAB — MAGNESIUM: Magnesium: 2.2 mg/dL (ref 1.7–2.4)

## 2021-05-09 MED ORDER — AMIODARONE HCL 200 MG PO TABS: 200.0000 mg | ORAL_TABLET | Freq: Two times a day (BID) | ORAL | 0 refills | Status: DC

## 2021-05-09 MED ORDER — POTASSIUM CHLORIDE CRYS ER 20 MEQ PO TBCR
40.0000 meq | EXTENDED_RELEASE_TABLET | Freq: Once | ORAL | Status: AC
  Administered 2021-05-09: 40 meq via ORAL
  Filled 2021-05-09: qty 2

## 2021-05-09 MED ORDER — FUROSEMIDE 40 MG PO TABS: 40.0000 mg | ORAL_TABLET | Freq: Every day | ORAL | Status: DC

## 2021-05-09 MED ORDER — FUROSEMIDE 40 MG PO TABS: 40.0000 mg | ORAL_TABLET | Freq: Every day | ORAL | 0 refills | Status: AC

## 2021-05-09 MED ORDER — AMIODARONE HCL 200 MG PO TABS
200.0000 mg | ORAL_TABLET | Freq: Two times a day (BID) | ORAL | Status: DC
  Administered 2021-05-09: 200 mg via ORAL
  Filled 2021-05-09: qty 1

## 2021-05-09 NOTE — Anesthesia Postprocedure Evaluation (Signed)
Anesthesia Post Note  Patient: Charles Wyatt  Procedure(s) Performed: TRANSESOPHAGEAL ECHOCARDIOGRAM (TEE) CARDIOVERSION BUBBLE STUDY     Patient location during evaluation: PACU Anesthesia Type: MAC Level of consciousness: awake and alert Pain management: pain level controlled Vital Signs Assessment: post-procedure vital signs reviewed and stable Respiratory status: spontaneous breathing, nonlabored ventilation, respiratory function stable and patient connected to nasal cannula oxygen Cardiovascular status: stable and blood pressure returned to baseline Postop Assessment: no apparent nausea or vomiting Anesthetic complications: no   No notable events documented.  Last Vitals:  Vitals:   05/09/21 0436 05/09/21 0758  BP: 121/80 127/89  Pulse: (!) 59 82  Resp: 16 18  Temp: 36.7 C 36.8 C  SpO2: 95% 93%    Last Pain:  Vitals:   05/09/21 0758  TempSrc: Oral  PainSc:                  [name]

## 2021-05-09 NOTE — Discharge Instructions (Addendum)
Heart Failure  SPECIAL INSTRUCTIONS AVOID STRAINING STOP ANY ACTIVITY THAT CAUSES CHEST PAIN, SHORTNESS OF BREATH, DIZZINESS, SWEATING, OR EXCESSIVE WEAKNESS.  SPECIAL INSTRUCTIONS FOR PATIENTS WITH HEART FAILURE: Continue to follow the instructions in your Heart Failure Patient Education information that you received during your hospital stay. Record daily weight on same scale at same time of day. If your doctor did not discuss your diet or activity in the information above, please follow a Heart Healthy Low Sodium diet and increase your activity as you feel able. Call your doctor: (Anytime you feel any of the following symptoms) 3-4 pound weight gain in 1-2 days or 2 pounds overnight Shortness of breath, with or without a dry hacking cough Swelling in the hands, feet or stomach If you have to sleep on extra pillows at night in order to breathe   

## 2021-05-09 NOTE — Progress Notes (Signed)
Subjective:  CPAP on slept well  Objective:  Vitals:   05/08/21 2023 05/09/21 0013 05/09/21 0436 05/09/21 0758  BP: 118/84 118/85 121/80 127/89  Pulse: 61 60 (!) 59 82  Resp: 16 17 16 18   Temp: 98.3 F (36.8 C) 97.7 F (36.5 C) 98 F (36.7 C) 98.3 F (36.8 C)  TempSrc: Oral Axillary Oral Oral  SpO2: 97% 98% 95% 93%  Weight:   100.8 kg   Height:        Intake/Output from previous day:  Intake/Output Summary (Last 24 hours) at 05/09/2021 0848 Last data filed at 05/08/2021 2300 Gross per 24 hour  Intake 1719.6 ml  Output 1675 ml  Net 44.6 ml    Physical Exam: Affect appropriate Healthy:  appears stated age HEENT: normal Neck supple with no adenopathy JVP normal no bruits no thyromegaly Lungs basilar crackles  Heart:  S1/S2 no murmur, no rub, gallop or click AICD under right clavicle  Abdomen: benighn, BS positve, no tenderness, no AAA no bruit.  No HSM or HJR Distal pulses intact with no bruits No edema Neuro non-focal Skin warm and dry No muscular weakness   Lab Results: Basic Metabolic Panel: Recent Labs    05/08/21 0348 05/09/21 0409  NA 136 136  K 3.4* 3.5  CL 97* 97*  CO2 26 27  GLUCOSE 132* 117*  BUN 21* 27*  CREATININE 1.75* 1.97*  CALCIUM 8.8* 9.0  MG  --  2.2   Liver Function Tests: No results for input(s): AST, ALT, ALKPHOS, BILITOT, PROT, ALBUMIN in the last 72 hours. No results for input(s): LIPASE, AMYLASE in the last 72 hours. CBC: Recent Labs    05/07/21 0313  WBC 8.3  HGB 15.2  HCT 46.0  MCV 96.8  PLT 441*   Cardiac Enzymes: No results for input(s): CKTOTAL, CKMB, CKMBINDEX, TROPONINI in the last 72 hours. BNP: Invalid input(s): POCBNP D-Dimer: No results for input(s): DDIMER in the last 72 hours. Hemoglobin A1C: Recent Labs    05/06/21 1807  HGBA1C 5.9*   Fasting Lipid Panel: No results for input(s): CHOL, HDL, LDLCALC, TRIG, CHOLHDL, LDLDIRECT in the last 72 hours. Thyroid Function Tests: Recent Labs     05/06/21 1807  TSH 0.196*   Anemia Panel: No results for input(s): VITAMINB12, FOLATE, FERRITIN, TIBC, IRON, RETICCTPCT in the last 72 hours.  Imaging: No results found.   Cardiac Studies:  ECG: P synch pacing    Telemetry:p synch pacing   Echo: EF 55-60% septal thickness 19 mm severe MR  Medications:    amiodarone  200 mg Oral BID   apixaban  5 mg Oral BID   furosemide  40 mg Intravenous BID   metoprolol tartrate  50 mg Oral BID WC   PARoxetine  20 mg Oral Daily   potassium chloride  40 mEq Oral Once   sodium chloride flush  3 mL Intravenous Q12H   sodium chloride flush  3 mL Intravenous Q12H      sodium chloride      Assessment/Plan:   1. Flash Pulmonary edema:  Related to diastolic dysfunction of HOCM and severe MR Dynamic changes in MR severity d/c with lasix 40 mg daily continue beta blocker  will repeat echo in 3 months  2. PAF:  NSR with p synch pacing post TEE/DCC 05/08/21 Multaq d/c in setting of CHF d/c with amiodarone 200 bid Has outpatient f/u with Dr 07/08/21 to consider ablation  3. HOCM:  Family history of AICD in place  no SAM/LVOT gradient  4. Anxiety:  PRN valium started on Paroxetine   Lasix added to meds. Multaq d/c amiodarone 200 bid started  D/c home today   [name] 05/09/2021, 8:48 AM

## 2021-05-09 NOTE — Discharge Summary (Addendum)
Physician Discharge Summary  Charles Wyatt ZOX:[id number] DOB: Dec 22, 1970 DOA: 05/06/2021  PCP: Sharlene Dory, DO  Admit date: 05/06/2021 Discharge date: 05/09/2021  Admitted From: Home Disposition: Home  Recommendations for Outpatient Follow-up:  Follow up with PCP in 1-2 weeks Follow-up with electrophysiology, Dr. Graciela Husbands scheduled on 05/19/2021 at 1115am. Follow-up with cardiology, Dr. Eden Emms as scheduled Discontinued Multaq Started on amiodarone 200 mg p.o. twice daily and furosemide 40 mg p.o. daily Please obtain BMP in one week to assess renal function Needs repeat TTE in 3 months Continue to encourage weight loss Consider outpatient nephrology referral for renal insufficiency  Home Health: No Equipment/Devices: None  Discharge Condition: Stable CODE STATUS: Full code Diet recommendation: Heart healthy diet  History of present illness:  Charles Wyatt is a 50 year old male with past medical history significant for hypertrophic cardiomyopathy s/p AICD, paroxysmal atrial fibrillation on anticoagulation, OSA on CPAP, obesity who presents to [facility], ED on 6/7 with acute shortness of breath.  Patient reports awoken from sleep around 4 AM with difficulty breathing with associated cough with blood-tinged sputum.  Patient also endorses weight loss of approximately 14 pounds over the last month with dietary changes.  Patient denies any fever, no chest pain, no orthopnea, no nausea/vomiting/diarrhea.  Patient was seen by his PCP yesterday due to his progressive anxiety and being unable to sleep at night.   Patient was found to be in atrial fibrillation and was cardioverted on 5/13 and started back on amiodarone.  However patient reported feeling very bad after placed on this medication and was switched to Multaq on 5/27 after his follow-up visit with Dr. Graciela Husbands.   Upon EMS arrival, patient was noted to have O2 saturations of 88% on room air was placed on nonrebreather and  subsequently switched to CPAP.  He was given 2 sublingual nitroglycerin tablets and was noted to be in atrial fibrillation.   In the ED, temperature 97.8 F, HR 88, RR 28, BP 137/81, SPO2 88% on room air, with improvement on BiPAP 99-100%.  Labs significant for WBC 13.1, platelets 611, BUN 22, creatinine 1.8, glucose 295, BNP 940.4, and high-sensitivity troponin 22.  Chest x-ray was consistent with congestive heart failure.  COVID-19 and influenza screening were both negative.  Patient was given 20 mg of Lasix IV.    Cardiology was consulted.  TRH called to admit.  Hospital course:  Acute hypoxic respiratory failure, POA Flash pulmonary edema Acute on chronic diastolic congestive heart failure Hypertrophic cardiomyopathy s/p AICD Severe mitral regurgitation Patient presenting with acute onset shortness of breath, noted to have O2 saturations 88% on room air.  Chest x-ray significant for pulmonary edema.  BNP elevated 940.  Patient was initially placed on BiPAP in the ED with decreased work of breathing and now weaned down to nasal cannula.  TTE with LVEF 55-60%, grade 3 diastolic dysfunction, LA severely dilated, severe MR, MR leaflets malcoaptation.  Cardiology and electrophysiology were consulted and followed during hospital course.  Patient was started on IV furosemide 40 mg IV every 12 hours with good diuresis.  Continued on metoprolol tartrate 50 mg p.o. twice daily.  Continue furosemide 40 mg p.o. daily on discharge.  Outpatient follow-up with cardiology and electrophysiology.  Consideration of MVR/maze procedure at some point.  Repeat TTE in 3 months.   Elevated troponin High-sensitivity troponin 22>29.  Etiology likely secondary to type II demand ischemia in the setting of pulmonary edema/volume overload.   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  Patient presenting with a creatinine elevated to 1.8.  Patient was started on IV diuresis with creatinine 1.97 at  discharge.  Per cardiology, will decrease furosemide to 40 mg p.o. daily.  Needs repeat BMP in 1-2 weeks.  Outpatient follow-up with PCP.  Consider outpatient referral to nephrology.   Paroxysmal atrial fibrillation Recently attempted cardioversion on 5/13.  CHA2DS2-VASc score = 1.  Underwent TEE with [facility] on 05/08/2021 that was successful.  Multaq was discontinued.  Patient was started on amiodarone drip which was transitioned to 200 mg p.o. twice daily on discharge.  Continue Eliquis.  Has outpatient follow-up with electrophysiology on 05/19/2021.   Anxiety Patiently recently been seen by PCP in regards to anxiety and was recommended started on Paxil and hydroxyzine as needed.  These medications have not been started yet outpatient. Tinea paroxetine 20 mg p.o. daily, Hydroxyzine every 8 hours as needed anxiety, Ativan 0.25 mg p.o. daily anxiety not relieved with hydroxyzine.  Outpatient follow-up with PCP.   OSA Continue nocturnal CPAP   Obesity Body mass index is 34.96 kg/m.  Discussed with patient needs for aggressive lifestyle changes/weight loss as this complicates all facets of care.  Reports recent 14 pound weight loss after dietary changes.  Outpatient follow-up with PCP.  Discharge Diagnoses:  Active Problems:   OBESITY   Hypertrophic cardiomyopathy (HCC)   Implantable cardioverter-defibrillator (ICD) in situ   PAF (paroxysmal atrial fibrillation) (HCC)   Acute kidney injury superimposed on CKD (HCC)   Thrombocytosis   Acute systolic CHF (congestive heart failure) (HCC)   Elevated troponin    Discharge Instructions  Discharge Instructions     (HEART FAILURE PATIENTS) Call MD:  Anytime you have any of the following symptoms: 1) 3 pound weight gain in 24 hours or 5 pounds in 1 week 2) shortness of breath, with or without a dry hacking cough 3) swelling in the hands, feet or stomach 4) if you have to sleep on extra pillows at night in order to breathe.   Complete by: As  directed    Call MD for:  difficulty breathing, headache or visual disturbances   Complete by: As directed    Call MD for:  extreme fatigue   Complete by: As directed    Call MD for:  persistant dizziness or light-headedness   Complete by: As directed    Call MD for:  persistant nausea and vomiting   Complete by: As directed    Call MD for:  severe uncontrolled pain   Complete by: As directed    Call MD for:  temperature >100.4   Complete by: As directed    Diet - low sodium heart healthy   Complete by: As directed    Increase activity slowly   Complete by: As directed       Allergies as of 05/09/2021   No Known Allergies      Medication List     STOP taking these medications    Multaq 400 MG tablet Generic drug: dronedarone       TAKE these medications    acetaminophen 500 MG tablet Commonly known as: TYLENOL Take 500-1,000 mg by mouth every 6 (six) hours as needed for mild pain or headache.   amiodarone 200 MG tablet Commonly known as: PACERONE Take 1 tablet (200 mg total) by mouth 2 (two) times daily.   furosemide 40 MG tablet Commonly known as: LASIX Take 1 tablet (40 mg total) by mouth daily. Start taking on: May 10, 2021  hydrOXYzine 25 MG capsule Commonly known as: VISTARIL Take 1-3 capsules (25-75 mg total) by mouth every 8 (eight) hours as needed for anxiety.   LORazepam 0.5 MG tablet Commonly known as: ATIVAN Take 0.5 tablets (0.25 mg total) by mouth daily as needed for anxiety.   metoprolol tartrate 50 MG tablet Commonly known as: LOPRESSOR Take 50 mg by mouth 2 (two) times daily with a meal.       ASK your doctor about these medications    apixaban 5 MG Tabs tablet Commonly known as: Eliquis TAKE 1 TABLET BY MOUTH 2 (TWO) TIMES DAILY. PLEASE KEEP LAB APPT FOR FURTHER REFILLS. THANKS   PARoxetine 20 MG tablet Commonly known as: Paxil Take 1 tablet (20 mg total) by mouth daily.        Follow-up Information     Duke Salvia, MD Follow up.   Specialty: Cardiology Why: 05/19/21 @ 11:15AM Contact information: [address] [place] [address] [phone]         Sharlene Dory, DO. Schedule an appointment as soon as possible for a visit in 1 week(s).   Specialty: Family Medicine Contact information: [address] [place] [address] [phone]         Wendall Stade, MD. Schedule an appointment as soon as possible for a visit.   Specialty: Cardiology Contact information: [address] [place] [address] [phone]                No Known Allergies  Consultations: Cardiology Electrophysiology   Procedures/Studies: DG Chest Port 1 View  Result Date: 05/06/2021 CLINICAL DATA:  Respiratory distress and shortness of breath EXAM: PORTABLE CHEST 1 VIEW COMPARISON:  01/01/2018 FINDINGS: Dual-chamber defibrillator leads from the right. Chronic cardiomegaly. Confluent airspace disease with septal thickening. No definite pleural effusion. IMPRESSION: CHF. Electronically Signed   By: Marnee Spring M.D.   On: 05/06/2021 06:23   ECHOCARDIOGRAM COMPLETE  Result Date: 05/06/2021    ECHOCARDIOGRAM REPORT   Patient Name:   Charles Wyatt Date of Exam: 05/06/2021 Medical Rec #:  [id number]       Height:       67.0 in Accession #:    [id number]      Weight:       237.0 lb Date of Birth:  1971/05/30        BSA:          2.174 m Patient Age:    49 years        BP:           102/78 mmHg Patient Gender: M               HR:           79 bpm. Exam Location:  Inpatient Procedure: 2D Echo, 3D Echo, Cardiac Doppler, Color Doppler and Strain Analysis STAT ECHO Indications:    Dyspnea R06.00  History:        Patient has prior history of Echocardiogram examinations, most                 recent 09/06/2017. CHF, Defibrillator, Arrythmias:Atrial                 Fibrillation; Risk Factors:Sleep Apnea. HOCM.  Sonographer:    Leta Jungling RDCS Referring  Phys: [phone] HAO MENG IMPRESSIONS  1. Left ventricular ejection fraction, by estimation, is 55 to 60%. The left ventricle has normal function. The  left ventricle has no regional wall motion abnormalities. There is moderate asymmetric left ventricular hypertrophy of the basal-septal segment. Left ventricular diastolic parameters are consistent with Grade III diastolic dysfunction (restrictive). Elevated left atrial pressure.  2. Right ventricular systolic function is normal. The right ventricular size is normal. There is moderately elevated pulmonary artery systolic pressure.  3. Left atrial size was severely dilated.  4. Severe mitral valve regurgitation. No evidence of mitral stenosis. There is no evidence of systolic anterior motion of the mitral leaflets. There appears to be leaflet malcoaptation. There is no LV outflow obstruction. Effective regurgitant area is 0.6 cm, regurgitant volume 57 ml, regurgitant fraction 60%. The MR jet has a triangular, "v-wave cutoff" configuration. Pulmonary venous flow shows systolic flow reversal.  5. The aortic valve is tricuspid. Aortic valve regurgitation is not visualized. No aortic stenosis is present. FINDINGS  Left Ventricle: Left ventricular ejection fraction, by estimation, is 55 to 60%. The left ventricle has normal function. The left ventricle has no regional wall motion abnormalities. The left ventricular internal cavity size was normal in size. There is  moderate asymmetric left ventricular hypertrophy of the basal-septal segment. Left ventricular diastolic parameters are consistent with Grade III diastolic dysfunction (restrictive). Elevated left atrial pressure. Right Ventricle: The right ventricular size is normal. No increase in right ventricular wall thickness. Right ventricular systolic function is normal. There is moderately elevated pulmonary artery systolic pressure. The tricuspid regurgitant velocity is 3.09 m/s, and with an assumed right atrial pressure  of 15 mmHg, the estimated right ventricular systolic pressure is 53.2 mmHg. Left Atrium: Left atrial size was severely dilated. Right Atrium: Right atrial size was normal in size. Pericardium: There is no evidence of pericardial effusion. Mitral Valve: There is no evidence of systolic anterior motion of the mitral leaflets. There appears to be leaflet malcoaptation. There is no LV outflow obstruction. Effective regurgitant area is 0.6 cm, regurgitant volume 57 ml, regurgitant fraction 60%. The MR jet has a triangular, "v-wave cutoff" configuration. The mitral valve is abnormal. There is mild thickening of the mitral valve leaflet(s). Severe mitral valve regurgitation, with eccentric posteriorly directed jet. No evidence of mitral valve stenosis. Mitral valve area by planimetry is 8.04 cm, and by the pressure half-time method is calculated to be 6.02 cm. The mean transmitral gradient is 1.0 mmHg. MV peak gradient, 6.7 mmHg. The mean mitral valve gradient is 1.0 mmHg. Pulmonary venous flow shows systolic flow reversal. Tricuspid Valve: The tricuspid valve is normal in structure. Tricuspid valve regurgitation is trivial. Aortic Valve: The aortic valve is tricuspid. Aortic valve regurgitation is not visualized. No aortic stenosis is present. Pulmonic Valve: The pulmonic valve was normal in structure. Pulmonic valve regurgitation is trivial. Aorta: The aortic root and ascending aorta are structurally normal, with no evidence of dilitation. IAS/Shunts: No atrial level shunt detected by color flow Doppler. Additional Comments: A device lead is visualized in the right ventricle.  LEFT VENTRICLE PLAX 2D LVIDd:         4.20 cm LVIDs:         2.40 cm LV PW:         1.20 cm LV IVS:        1.90 cm LVOT diam:     2.00 cm LV SV:         40 LV SV Index:   18 LVOT Area:     3.14 cm  RIGHT VENTRICLE RV S prime:     13.70 cm/s TAPSE (M-mode): 1.4 cm  LEFT ATRIUM              Index       RIGHT ATRIUM           Index LA diam:         6.80 cm  3.13 cm/m  RA Area:     18.60 cm LA Vol (A2C):   191.0 ml 87.88 ml/m RA Volume:   44.80 ml  20.61 ml/m LA Vol (A4C):   190.0 ml 87.42 ml/m LA Biplane Vol: 193.0 ml 88.80 ml/m  AORTIC VALVE LVOT Vmax:   88.65 cm/s LVOT Vmean:  59.950 cm/s LVOT VTI:    0.128 m  AORTA Ao Root diam: 2.95 cm MITRAL VALVE                 TRICUSPID VALVE MV Area (PHT):  6.02 cm     TR Peak grad:   38.2 mmHg MV Area (plan): 8.04 cm     TR Vmax:        309.00 cm/s MV Peak grad:   6.7 mmHg MV Mean grad:   1.0 mmHg     SHUNTS MV Vmax:        1.29 m/s     Systemic VTI:  0.13 m MV Vmean:       51.5 cm/s    Systemic Diam: 2.00 cm MV Decel Time:  126 msec MR Peak grad:    62.3 mmHg MR Mean grad:    33.5 mmHg MR Vmax:         394.50 cm/s MR Vmean:        269.0 cm/s MR PISA:         8.07 cm MR PISA Eff ROA: 60 mm MR PISA Radius:  1.2 cm MV E velocity: 127.33 cm/s Rachelle Hora Croitoru MD Electronically signed by Thurmon Fair MD Signature Date/Time: 05/06/2021/2:34:34 PM    Final      Subjective: Patient seen examined bedside, resting comfortably.  Spouse present.  Okay for discharge per EP and cardiology with outpatient follow-up.  No other questions or concerns at this time.  Denies headache, no dizziness, no chest pain, no palpitations, no shortness of breath, no abdominal pain.  No acute events overnight per nursing staff.  Discharge Exam: Vitals:   05/09/21 0436 05/09/21 0758  BP: 121/80 127/89  Pulse: (!) 59 82  Resp: 16 18  Temp: 98 F (36.7 C) 98.3 F (36.8 C)  SpO2: 95% 93%   Vitals:   05/08/21 2023 05/09/21 0013 05/09/21 0436 05/09/21 0758  BP: 118/84 118/85 121/80 127/89  Pulse: 61 60 (!) 59 82  Resp: Temp: 98.3 F (36.8 C) 97.7 F (36.5 C) 98 F (36.7 C) 98.3 F (36.8 C)  TempSrc: Oral Axillary Oral Oral  SpO2: 97% 98% 95% 93%  Weight:   100.8 kg   Height:        General: Pt is alert, awake, not in acute distress Cardiovascular: RRR, S1/S2 +, 2/6 SEM, no rubs, no  gallops Respiratory: CTA bilaterally, no wheezing, no rhonchi, on room air Abdominal: Soft, NT, ND, bowel sounds + Extremities: no edema, no cyanosis    The results of significant diagnostics from this hospitalization (including imaging, microbiology, ancillary and laboratory) are listed below for reference.     Microbiology: Recent Results (from the past 240 hour(s))  Resp Panel by RT-PCR (Flu A&B, Covid) Nasopharyngeal Swab     Status: None   Collection Time: 05/06/21  6:24 AM  Specimen: Nasopharyngeal Swab; Nasopharyngeal(NP) swabs in vial transport medium  Result Value Ref Range Status   SARS Coronavirus 2 by RT PCR NEGATIVE NEGATIVE Final    Comment: (NOTE) SARS-CoV-2 target nucleic acids are NOT DETECTED.  The SARS-CoV-2 RNA is generally detectable in upper respiratory specimens during the acute phase of infection. The lowest concentration of SARS-CoV-2 viral copies this assay can detect is 138 copies/mL. A negative result does not preclude SARS-Cov-2 infection and should not be used as the sole basis for treatment or other patient management decisions. A negative result may occur with  improper specimen collection/handling, submission of specimen other than nasopharyngeal swab, presence of viral mutation(s) within the areas targeted by this assay, and inadequate number of viral copies(<138 copies/mL). A negative result must be combined with clinical observations, patient history, and epidemiological information. The expected result is Negative.  Fact Sheet for Patients:  BloggerCourse.com  Fact Sheet for Healthcare Providers:  SeriousBroker.it  This test is no t yet approved or cleared by the [place] FDA and  has been authorized for detection and/or diagnosis of SARS-CoV-2 by FDA under an Emergency Use Authorization (EUA). This EUA will remain  in effect (meaning this test can be used) for the duration of  the COVID-19 declaration under Section 564(b)(1) of the Act, 21 U.S.C.section 360bbb-3(b)(1), unless the authorization is terminated  or revoked sooner.       Influenza A by PCR NEGATIVE NEGATIVE Final   Influenza B by PCR NEGATIVE NEGATIVE Final    Comment: (NOTE) The Xpert Xpress SARS-CoV-2/FLU/RSV plus assay is intended as an aid in the diagnosis of influenza from Nasopharyngeal swab specimens and should not be used as a sole basis for treatment. Nasal washings and aspirates are unacceptable for Xpert Xpress SARS-CoV-2/FLU/RSV testing.  Fact Sheet for Patients: BloggerCourse.com  Fact Sheet for Healthcare Providers: SeriousBroker.it  This test is not yet approved or cleared by the [place] FDA and has been authorized for detection and/or diagnosis of SARS-CoV-2 by FDA under an Emergency Use Authorization (EUA). This EUA will remain in effect (meaning this test can be used) for the duration of the COVID-19 declaration under Section 564(b)(1) of the Act, 21 U.S.C. section 360bbb-3(b)(1), unless the authorization is terminated or revoked.  Performed at [facility], [address], [place], [place] [address]      Labs: BNP (last 3 results) Recent Labs    05/06/21 0607  BNP 940.4*   Basic Metabolic Panel: Recent Labs  Lab 05/06/21 0607 05/07/21 0313 05/08/21 0348 05/09/21 0409  NA 136 138 136 136  K 4.2 3.9 3.4* 3.5  CL 104 99 97* 97*  CO2 19* 30 26 27   GLUCOSE 295* 132* 132* 117*  BUN 22* 21* 21* 27*  CREATININE 1.80* 1.66* 1.75* 1.97*  CALCIUM 8.6* 9.3 8.8* 9.0  MG  --   --   --  2.2   Liver Function Tests: No results for input(s): AST, ALT, ALKPHOS, BILITOT, PROT, ALBUMIN in the last 168 hours. No results for input(s): LIPASE, AMYLASE in the last 168 hours. No results for input(s): AMMONIA in the last 168 hours. CBC: Recent Labs  Lab 05/06/21 0607 05/07/21 0313  WBC 13.1* 8.3  NEUTROABS  9.1*  --   HGB 15.6 15.2  HCT 48.3 46.0  MCV 99.6 96.8  PLT 611* 441*   Cardiac Enzymes: No results for input(s): CKTOTAL, CKMB, CKMBINDEX, TROPONINI in the last 168 hours. BNP: Invalid input(s): POCBNP CBG: Recent Labs  Lab 05/06/21  1141  GLUCAP 96   D-Dimer No results for input(s): DDIMER in the last 72 hours. Hgb A1c Recent Labs    05/06/21 1807  HGBA1C 5.9*   Lipid Profile No results for input(s): CHOL, HDL, LDLCALC, TRIG, CHOLHDL, LDLDIRECT in the last 72 hours. Thyroid function studies Recent Labs    05/06/21 1807  TSH 0.196*   Anemia work up No results for input(s): VITAMINB12, FOLATE, FERRITIN, TIBC, IRON, RETICCTPCT in the last 72 hours. Urinalysis    Component Value Date/Time   COLORURINE YELLOW 05/17/2010 2017   APPEARANCEUR CLEAR 05/17/2010 2017   LABSPEC 1.030 05/17/2010 2017   PHURINE 5.5 05/17/2010 2017   GLUCOSEU NEGATIVE 05/17/2010 2017   HGBUR NEGATIVE 05/17/2010 2017   BILIRUBINUR NEGATIVE 05/17/2010 2017   KETONESUR NEGATIVE 05/17/2010 2017   PROTEINUR NEGATIVE 05/17/2010 2017   UROBILINOGEN 0.2 05/17/2010 2017   NITRITE NEGATIVE 05/17/2010 2017   LEUKOCYTESUR  05/17/2010 2017    NEGATIVE MICROSCOPIC NOT DONE ON URINES WITH NEGATIVE PROTEIN, BLOOD, LEUKOCYTES, NITRITE, OR GLUCOSE <1000 mg/dL.   Sepsis Labs Invalid input(s): PROCALCITONIN,  WBC,  LACTICIDVEN Microbiology Recent Results (from the past 240 hour(s))  Resp Panel by RT-PCR (Flu A&B, Covid) Nasopharyngeal Swab     Status: None   Collection Time: 05/06/21  6:24 AM   Specimen: Nasopharyngeal Swab; Nasopharyngeal(NP) swabs in vial transport medium  Result Value Ref Range Status   SARS Coronavirus 2 by RT PCR NEGATIVE NEGATIVE Final    Comment: (NOTE) SARS-CoV-2 target nucleic acids are NOT DETECTED.  The SARS-CoV-2 RNA is generally detectable in upper respiratory specimens during the acute phase of infection. The lowest concentration of SARS-CoV-2 viral copies this assay can  detect is 138 copies/mL. A negative result does not preclude SARS-Cov-2 infection and should not be used as the sole basis for treatment or other patient management decisions. A negative result may occur with  improper specimen collection/handling, submission of specimen other than nasopharyngeal swab, presence of viral mutation(s) within the areas targeted by this assay, and inadequate number of viral copies(<138 copies/mL). A negative result must be combined with clinical observations, patient history, and epidemiological information. The expected result is Negative.  Fact Sheet for Patients:  BloggerCourse.com  Fact Sheet for Healthcare Providers:  SeriousBroker.it  This test is no t yet approved or cleared by the [place] FDA and  has been authorized for detection and/or diagnosis of SARS-CoV-2 by FDA under an Emergency Use Authorization (EUA). This EUA will remain  in effect (meaning this test can be used) for the duration of the COVID-19 declaration under Section 564(b)(1) of the Act, 21 U.S.C.section 360bbb-3(b)(1), unless the authorization is terminated  or revoked sooner.       Influenza A by PCR NEGATIVE NEGATIVE Final   Influenza B by PCR NEGATIVE NEGATIVE Final    Comment: (NOTE) The Xpert Xpress SARS-CoV-2/FLU/RSV plus assay is intended as an aid in the diagnosis of influenza from Nasopharyngeal swab specimens and should not be used as a sole basis for treatment. Nasal washings and aspirates are unacceptable for Xpert Xpress SARS-CoV-2/FLU/RSV testing.  Fact Sheet for Patients: BloggerCourse.com  Fact Sheet for Healthcare Providers: SeriousBroker.it  This test is not yet approved or cleared by the [place] FDA and has been authorized for detection and/or diagnosis of SARS-CoV-2 by FDA under an Emergency Use Authorization (EUA). This EUA will remain in  effect (meaning this test can be used) for the duration of the COVID-19 declaration under Section 564(b)(1) of the Act, 21  U.S.C. section 360bbb-3(b)(1), unless the authorization is terminated or revoked.  Performed at [facility], [address], [place], [place] [address]      Time coordinating discharge: Over 30 minutes  SIGNED:   Alvira Philips [place], DO  [facility] 05/09/2021, 9:58 AM

## 2021-05-09 NOTE — Progress Notes (Addendum)
sd  Progress Note  Patient Name: Charles Wyatt Date of Encounter: 05/09/2021  [facility] Cardiologist: [name], MD  EP: Dr. Graciela Husbands  Subjective   Feeling much better  Inpatient Medications    Scheduled Meds:  apixaban  5 mg Oral BID   furosemide  40 mg Intravenous BID   metoprolol tartrate  50 mg Oral BID WC   PARoxetine  20 mg Oral Daily   potassium chloride  40 mEq Oral Once   sodium chloride flush  3 mL Intravenous Q12H   sodium chloride flush  3 mL Intravenous Q12H   Continuous Infusions:  sodium chloride     amiodarone 30 mg/hr (05/09/21 0201)   PRN Meds: sodium chloride, acetaminophen, hydrOXYzine, LORazepam, ondansetron (ZOFRAN) IV, sodium chloride flush   Vital Signs    Vitals:   05/08/21 2023 05/09/21 0013 05/09/21 0436 05/09/21 0758  BP: 118/84 118/85 121/80 127/89  Pulse: 61 60 (!) 59 82  Resp: 16 17 16 18   Temp: 98.3 F (36.8 C) 97.7 F (36.5 C) 98 F (36.7 C) 98.3 F (36.8 C)  TempSrc: Oral Axillary Oral Oral  SpO2: 97% 98% 95% 93%  Weight:   100.8 kg   Height:        Intake/Output Summary (Last 24 hours) at 05/09/2021 0835 Last data filed at 05/08/2021 2300 Gross per 24 hour  Intake 1719.6 ml  Output 1675 ml  Net 44.6 ml   Last 3 Weights 05/09/2021 05/08/2021 05/07/2021  Weight (lbs) 222 lb 3.2 oz 223 lb 3.2 oz 227 lb  Weight (kg) 100.789 kg 101.243 kg 102.967 kg      Telemetry    AFib/flutter 90's-110s - Personally Reviewed  ECG    Yesterday with coarse AFib vs atypical flutter, 112bpm,  - Personally Reviewed  Physical Exam   GEN: No acute distress.   Neck: No JVD Cardiac: RRR, 2/6 SM, rubs, or gallops.  Respiratory: CTA b/l GI: Soft, nontender, non-distended  MS: No edema; No deformity. Neuro:  Nonfocal  Psych: Normal affect   Labs    High Sensitivity Troponin:   Recent Labs  Lab 05/06/21 0607 05/06/21 0807  TROPONINIHS 22* 29*      Chemistry Recent Labs  Lab 05/07/21 0313 05/08/21 0348 05/09/21 0409  NA  138 136 136  K 3.9 3.4* 3.5  CL 99 97* 97*  CO2 30 26 27   GLUCOSE 132* 132* 117*  BUN 21* 21* 27*  CREATININE 1.66* 1.75* 1.97*  CALCIUM 9.3 8.8* 9.0  GFRNONAA 51* 47* 41*  ANIONGAP 9 13 12      Hematology Recent Labs  Lab 05/06/21 0607 05/07/21 0313  WBC 13.1* 8.3  RBC 4.85 4.75  HGB 15.6 15.2  HCT 48.3 46.0  MCV 99.6 96.8  MCH 32.2 32.0  MCHC 32.3 33.0  RDW 12.8 12.8  PLT 611* 441*    BNP Recent Labs  Lab 05/06/21 0607  BNP 940.4*     DDimer No results for input(s): DDIMER in the last 168 hours.   Radiology       Cardiac Studies    05/06/21: TTE IMPRESSIONS   1. Left ventricular ejection fraction, by estimation, is 55 to 60%. The  left ventricle has normal function. The left ventricle has no regional  wall motion abnormalities. There is moderate asymmetric left ventricular  hypertrophy of the basal-septal  segment. Left ventricular diastolic parameters are consistent with Grade  III diastolic dysfunction (restrictive). Elevated left atrial pressure.   2. Right ventricular systolic function is  normal. The right ventricular  size is normal. There is moderately elevated pulmonary artery systolic  pressure.   3. Left atrial size was severely dilated.   4. Severe mitral valve regurgitation. No evidence of mitral stenosis.  There is no evidence of systolic anterior motion of the mitral leaflets.  There appears to be leaflet malcoaptation. There is no LV outflow  obstruction. Effective regurgitant area is  0.6 cm, regurgitant volume 57 ml, regurgitant fraction 60%. The MR jet  has a triangular, "v-wave cutoff" configuration. Pulmonary venous flow  shows systolic flow reversal.   5. The aortic valve is tricuspid. Aortic valve regurgitation is not  visualized. No aortic stenosis is present.    09/06/2017: TTE Study Conclusions  - Left ventricle: LVEF is approximately 50% with hypokinesis of the    base/mid inferior and inferoseptal walls. The cavity size was     normal. Wall thickness was increased in a pattern of severe LVH.  - Mitral valve: There was mild regurgitation.  - Left atrium: The atrium was moderately to severely dilated.  - Pericardium, extracardiac: A trivial pericardial effusion was    identified.    08/20/2017: TEE Study Conclusions  - Left ventricle: There was severe asymmetric hypertrophy of the    septum. Systolic function was normal. The estimated ejection    fraction was in the range of 50% to 55%. Wall motion was normal;    there were no regional wall motion abnormalities.  - Aortic valve: Structurally normal valve. Trileaflet; normal    thickness leaflets.  - Mitral valve: There was moderate to severe regurgitation directed    eccentrically likely from annular dilatation. No evidence of SAM.    The flow wraps around the LA with intermittent flow in the    pulmonary vein.  - Left atrium: The atrium was massively dilated. No evidence of    thrombus in the atrial cavity or appendage. There was    mildintermittent spontaneous echo contrast (&quot;smoke&quot;) in the    cavity. The appendage was morphologically a left appendage,    multilobulated, and of normal size. Emptying velocity was mildly    reduced.  - Right atrium: No evidence of thrombus in the atrial cavity or    appendage.  - Atrial septum: The septum bowed from left to right, consistent    with increased left atrial pressure.  - Pulmonic valve: No evidence of vegetation. There was trivial    regurgitation.   Patient Profile     50 y.o. male with a hx of HCM, ICD implanted originally 2011 (in setting of a family history of sudden death and significant septal thickness and hyperenhancing on cardiac MRI), Afib, VT    Device information MDT dual chamber ICD implanted 08/20/2010, gen change 11/29/2017 + appropriate therapies VT 2017 (in setting of weight loss aid/stimulant and off his BB) + inappropriate shock for AFib w/RVR   AAD 2018 Amiodarone for AFib  > d/c Nov 2021 given youn gage and potential side effects  resumed may 2022 with recurrent AFib Amiodarone Stopped may 22 with intolerance  > started on Multaq AF history Diagnosed 2014  Admitted with flash p.edema, new HF exacerbation  Assessment & Plan    1. 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 Admitted with HF exacerbation 3.  preserved LVEF, severe MR , severe LA dilation, 5mm, on echo this admission, no obstruction      TEE yesterday though  mild-mod MR only     Cumulative neg     Basically euvolemic yesterday     Weight down 7kg     Creat on the rise >> deferred to attdning cardiology team   3. Paroxysmal AFIb     CHA2DS2Vasc is one > 2 with CHF     S/p TEE/DCCV     Maintaining SR (A paced)     Transition to PO amiodarone 200mg  BID until seen by Dr. out patient      EP follow up will be arranged  We will sign off though remain available, please recall if needed   For questions or updates, please contact [facility] Please consult [email or web address] for contact info under      Signed, Graciela Husbands, PA-C  05/09/2021, 8:35 AM    EP Attending  Patient seen and examined. Agree with the findings as noted above. The patient presents with worsening CHF and his multaq stopped. He has been placed back on amiodarone. He will need loading with 200 mg twice daily. There appears to be a discrepancy that I cannot explain between the MR on TTE and TEE. See above. I wonder if sedation might have played a role. He will followup with Dr. 07/09/2021 to consider alternative therapies. If he had surgical MR then a MAZE would be a consideration but with 68 mm LA, long term, amiodarone likely the only way to keep in NSR.   Crissie Sickles Alvah Gilder,MD

## 2021-05-09 NOTE — Progress Notes (Signed)
I clarified with Nada Boozer about IV lasix administration. She spoke with Dr. Eden Emms and said it was ok to give the IV lasix with the slight increase in Cr. This am.

## 2021-05-09 NOTE — Progress Notes (Signed)
Patient discharged with wife in stable condition. No further questions and patient understood AVS.

## 2021-05-12 ENCOUNTER — Telehealth: Payer: Self-pay

## 2021-05-12 ENCOUNTER — Other Ambulatory Visit (HOSPITAL_COMMUNITY): Payer: Managed Care, Other (non HMO)

## 2021-05-12 NOTE — Telephone Encounter (Signed)
Transition Care Management Follow-up Telephone Call Date of discharge and from where: 05/09/21-[place] How have you been since you were released from the hospital? So far so good Any questions or concerns? No  Items Reviewed: Did the pt receive and understand the discharge instructions provided? Yes  Medications obtained and verified? Yes  Other? Yes  Any new allergies since your discharge? No  Dietary orders reviewed? Yes Do you have support at home? Yes   Home Care and Equipment/Supplies: Were home health services ordered? no If so, what is the name of the agency? N/a  Has the agency set up a time to come to the patient's home? not applicable Were any new equipment or medical supplies ordered?  No What is the name of the medical supply agency? N/a Were you able to get the supplies/equipment? not applicable   Functional Questionnaire: (I = Independent and D = Dependent) ADLs: I  Bathing/Dressing- I  Meal Prep- I  Eating- I  Maintaining continence- I  Transferring/Ambulation- I  Managing Meds- I  Follow up appointments reviewed:  PCP Hospital f/u appt confirmed? Yes  Scheduled to see Dr. Carmelia Roller on 05/16/21 @ 11:15. Specialist Hospital f/u appt confirmed? Yes  Scheduled to see Dr.Klein  on 05/19/21 @ 11:15. Are transportation arrangements needed? No  If their condition worsens, is the pt aware to call PCP or go to the Emergency Dept.? Yes Was the patient provided with contact information for the PCP's office or ED? Yes Was to pt encouraged to call back with questions or concerns? Yes

## 2021-05-14 ENCOUNTER — Telehealth: Payer: Self-pay

## 2021-05-14 NOTE — Telephone Encounter (Signed)
Spoke with pt and advised per [name] labs show some worsening of kidney function.  Pt reports he has f/u with [name] 05/19/2021 for recent hospital discharge.  See labs from inpatient hospital.

## 2021-05-14 NOTE — Telephone Encounter (Signed)
-----   Message from Duke Salvia, MD sent at 05/10/2021 12:28 PM EDT ----- Please Inform Patient mild Labs are normal x mild worsening in renal function   letes repeat in 2 weeks   Thanks

## 2021-05-16 ENCOUNTER — Ambulatory Visit (INDEPENDENT_AMBULATORY_CARE_PROVIDER_SITE_OTHER): Payer: Managed Care, Other (non HMO) | Admitting: Family Medicine

## 2021-05-16 ENCOUNTER — Other Ambulatory Visit: Payer: Self-pay

## 2021-05-16 ENCOUNTER — Encounter: Payer: Self-pay | Admitting: Family Medicine

## 2021-05-16 VITALS — BP 124/80 | HR 56 | Temp 99.0°F | Ht 67.0 in | Wt 222.4 lb

## 2021-05-16 DIAGNOSIS — F411 Generalized anxiety disorder: Secondary | ICD-10-CM | POA: Diagnosis not present

## 2021-05-16 DIAGNOSIS — E876 Hypokalemia: Secondary | ICD-10-CM

## 2021-05-16 DIAGNOSIS — N189 Chronic kidney disease, unspecified: Secondary | ICD-10-CM

## 2021-05-16 DIAGNOSIS — N179 Acute kidney failure, unspecified: Secondary | ICD-10-CM | POA: Diagnosis not present

## 2021-05-16 LAB — BASIC METABOLIC PANEL
BUN: 27 mg/dL — ABNORMAL HIGH (ref 6–23)
CO2: 29 mEq/L (ref 19–32)
Calcium: 9.7 mg/dL (ref 8.4–10.5)
Chloride: 99 mEq/L (ref 96–112)
Creatinine, Ser: 1.95 mg/dL — ABNORMAL HIGH (ref 0.40–1.50)
GFR: 39.83 mL/min — ABNORMAL LOW (ref >60.00)
Glucose, Bld: 109 mg/dL — ABNORMAL HIGH (ref 70–99)
Potassium: 3.9 mEq/L (ref 3.5–5.1)
Sodium: 138 mEq/L (ref 135–145)

## 2021-05-16 NOTE — Patient Instructions (Signed)
Give [name] 2-3 business days to get the results of your labs back.   Keep the diet clean and stay active.  Let's stay on the Paxil and hydroxyzine for now.  Let us know if you need anything.

## 2021-05-16 NOTE — Progress Notes (Signed)
Chief Complaint  Patient presents with   Hospitalization Follow-up    Subjective: Patient is a 50 y.o. male here for hosp f/u. Here w wife.   Patient was admitted to the hospital on 6/7 and discharged on 6/10 for flash pulmonary edema.  Interestingly, I saw him that morning and he was not having significant shortness of breath nor did his lungs sound like there was fluid on them.  He is breathing much better.  He is currently taking Lasix 40 mg daily.  He is not taking potassium associated with it.  He had acute kidney injury while in the hospital.  He was changed from Multaq to amiodarone.  He questions whether the Multaq contributed to his breathing.  He has a follow-up with the electrophysiology team on Monday.  He continues to take the Paxil and hydroxyzine as needed.  He has not noticed a significant improvement since starting the former but has only been on it for just over a week.  He has noticed some improvement with the hydroxyzine on an as-needed basis as it does help him sleep.  Past Medical History:  Diagnosis Date   Biatrial enlargement    HCM    06 19/2011 ?[facility] ?Tee to R/o subaortic membrane    Implantable cardiac defibrillator Medtronic    Inappropriate shocks from ICD --AFib    Persistent atrial fibrillation (HCC)    Inappro shock   Snoring     Objective: BP 124/80   Pulse (!) 56   Temp 99 F (37.2 C) (Oral)   Ht 5\' 7"  (1.702 m)   Wt 222 lb 6 oz (100.9 kg)   SpO2 94%   BMI 34.83 kg/m  General: Awake, appears stated age HEENT: MMM, EOMi Heart: RRR, no bruits, no lower extremity edema, no JVD Lungs: CTAB, no rales, wheezes or rhonchi. No accessory muscle use Psych: Age appropriate judgment and insight, normal affect and mood  Assessment and Plan: Acute kidney injury superimposed on CKD (HCC) - Plan: Basic metabolic panel  Hypokalemia - Plan: Basic metabolic panel  GAD (generalized anxiety disorder)  Breathing appears to be better. Check labs.  May need  to add potassium supplementation with Lasix. Continue Paxil and hydroxyzine.  I will see him as originally scheduled in July. The patient and his spouse voiced understanding and agreement to the plan.  Greater than 30 minutes were spent with the patient in addition to reviewing their chart information on the same day of the visit.   [place], DO 05/16/21  12:16 PM

## 2021-05-19 ENCOUNTER — Ambulatory Visit: Payer: Managed Care, Other (non HMO) | Admitting: Internal Medicine

## 2021-05-19 ENCOUNTER — Encounter: Payer: Self-pay | Admitting: Internal Medicine

## 2021-05-19 ENCOUNTER — Other Ambulatory Visit: Payer: Self-pay

## 2021-05-19 VITALS — BP 102/70 | HR 60 | Ht 67.0 in | Wt 225.0 lb

## 2021-05-19 DIAGNOSIS — I472 Ventricular tachycardia: Secondary | ICD-10-CM

## 2021-05-19 DIAGNOSIS — I4891 Unspecified atrial fibrillation: Secondary | ICD-10-CM

## 2021-05-19 DIAGNOSIS — Z95810 Presence of automatic (implantable) cardiac defibrillator: Secondary | ICD-10-CM | POA: Diagnosis not present

## 2021-05-19 DIAGNOSIS — I422 Other hypertrophic cardiomyopathy: Secondary | ICD-10-CM | POA: Diagnosis not present

## 2021-05-19 DIAGNOSIS — I4729 Other ventricular tachycardia: Secondary | ICD-10-CM

## 2021-05-19 MED ORDER — METOPROLOL TARTRATE 25 MG PO TABS: 25.0000 mg | ORAL_TABLET | Freq: Two times a day (BID) | ORAL | 3 refills | Status: AC

## 2021-05-19 MED ORDER — AMIODARONE HCL 200 MG PO TABS: ORAL_TABLET | ORAL | 3 refills | Status: AC

## 2021-05-19 NOTE — Patient Instructions (Signed)
Medication Instructions:  Your physician has recommended you make the following change in your medication:   Decrease your Metoprolol Tart from 50mg  1 tablet by mouth twice daily to 25mg  - 1/2 tablet by mouth twice daily.  Beginning on 06/08/2021 decrease Amiodarone to 200mg  - 1 tablet by mouth daily.  *If you need a refill on your cardiac medications before your next appointment, please call your pharmacy*   Lab Work: None ordered.  If you have labs (blood work) drawn today and your tests are completely normal, you will receive your results only by: MyChart Message (if you have MyChart) OR A paper copy in the mail If you have any lab test that is abnormal or we need to change your treatment, we will call you to review the results.   Testing/Procedures: None ordered.    Follow-Up: At [facility], you and your health needs are our priority.  As part of our continuing mission to provide you with exceptional heart care, we have created designated Provider Care Teams.  These Care Teams include your primary Cardiologist (physician) and Advanced Practice Providers (APPs -  Physician Assistants and Nurse Practitioners) who all work together to provide you with the care you need, when you need it.  We recommend signing up for the patient portal called "MyChart".  Sign up information is provided on this After Visit Summary.  MyChart is used to connect with patients for Virtual Visits (Telemedicine).  Patients are able to view lab/test results, encounter notes, upcoming appointments, etc.  Non-urgent messages can be sent to your provider as well.   To learn more about what you can do with MyChart, go to 08/09/2021.    Your next appointment:   3 month(s)  The format for your next appointment:   In Person  Provider:   , MD

## 2021-05-19 NOTE — Progress Notes (Signed)
Patient ID: Charles Wyatt, male   DOB: [birth year], 50 y.o.   MRN: [id number]      Patient Care Team: Sharlene Dory, DO as PCP - General (Family Medicine) Wendall Stade, MD as PCP - Cardiology (Cardiology) Duke Salvia, MD as PCP - Electrophysiology (Clinical Cardiac Electrophysiology) Duke Salvia, MD as Consulting Physician (Cardiology)   HPI  Charles Wyatt is a 50 y.o. male seen in followup for an ICD originally implanted 2011 for HCM with GEN change 12/18 in the setting of a family history of sudden death and significant septal thickness and hyperenhancing on cardiac MRI. He has hx of inappropriate shock from AFib RVR remotely  On apixoban  Seen in ER 5/22 with atrial fibrillation tachy palpitations and was cardioverted and resumed on amiodarone         11/05/16 following 3 ICD shocks. They were recorded by the fellow as appropriate. He declined admission  Review of these data however demonstrates that he had nonsustained ventricular tachycardia and shocks were delivered because of commitment following charging on shock 1 and with recurrent nonsustained ventricular tachycardia, subsequent shocks. This occurred in the context of having taken a new weight loss stimulants. He was also not taking his beta blocker.  His family history was reviewed. It seems that the gene runs through his mother family and a large kindred has been identified and is being followed in [place] and [place]   He was admitted 9/18 w recurrent AFib with RVR; he was started on amiodarone.  He saw Dr. Fawn Kirk to consider ablation; his low atrial fibrillation burden and left atrial size it was elected at that time to continue medical therapy  Seen in ER 5/22 with atrial fibrillation with a rapid rate.  Cardioverted.  He was started on amiodarone and then referred to Dr. Fawn Kirk for reconsideration of ablation--seen in the office a week later, concerned with tolerating amiodarone and it was stopped.  We tried him  on dronaderone.  He was seen in the emergency room a few weeks later with progressive dyspnea on exertion on nocturnal and presented to the emergency room with O2 sats in the 80s and was treated with BiPAP.  Chest x-ray and BNP were consistent with acute pulmonary edema Dr. Harlon Ditty thoughts were that disopyramide might be the better drug compared to amiodarone.  However continued on amiodarone.  Underwent TEE as noted below and concomitant cardioversion for atrial fibrillation which recurred following admission  Dr. Fawn Kirk felt not a candidate for endovascular ablation given severe left atrial enlargement and thought may be mitral valve repair/maze might be the better choice.    Today, the patient denies chest pain,  nocturnal dyspnea, orthopnea or peripheral edema.  There have been no palpitations, lightheadedness or syncope.  Complains of shortness of breath with exertion but no longer at rest..   Since last his last visit he hs only been out the hospital 05/06/21 for approx 1 week now and he notes he is feeling much better now ; he notes he have drunk some Gatorade and not to long after that he went to lay down and began to experience   He states he have began to eat healthier, Depleted in sodium intact as well as his fluid intake and do mild physical activity  He inquired about his his Lab results concerning his creatine levels :he notes he will have follow up labs in approx 10 days creatinine stable on 6/17 compared to 6/10 and 1.9    Date  Cr K TSH LFT Hgb  9/18   4.04     1/19  1.4 4.3 5.29 22 14.8   3/19   4.03    12/20 1.52 4.2 3.82 21   1/22 1.32 4.3   14.4  5/22 1.38 3.2   14.4  6/22 1.95 3.9      DATE TEST    9/17 Echo    EF 50-55 %   10/18 Echo    EF 50 % LVH/ LAE (5.8/2.6/67)  MR mil  6/22 Echo 55-60% MR severe; LAE   6/22 TEE  LA massively dilated MR mod-severe    Past Medical History:  Diagnosis Date   Biatrial enlargement    HCM    06 19/2011 ?[facility] ?Tee to R/o subaortic  membrane    Implantable cardiac defibrillator Medtronic    Inappropriate shocks from ICD --AFib    Persistent atrial fibrillation (HCC)    Inappro shock   Snoring     Past Surgical History:  Procedure Laterality Date   BUBBLE STUDY  05/08/2021   Procedure: BUBBLE STUDY;  Surgeon: Parke Poisson, MD;  [facility] ENDOSCOPY;  Service: Cardiovascular;;   CARDIOVERSION N/A 08/20/2017   Procedure: CARDIOVERSION;  Surgeon: Quintella Reichert, MD;  Location: [facility] ENDOSCOPY;  Service: Cardiovascular;  Laterality: N/A;   CARDIOVERSION N/A 08/24/2017   Procedure: CARDIOVERSION;  Surgeon: Jake Bathe, MD;  Location: [facility] ENDOSCOPY;  Service: Cardiovascular;  Laterality: N/A;   CARDIOVERSION N/A 05/08/2021   Procedure: CARDIOVERSION;  Surgeon: Parke Poisson, MD;  Location: [facility] ENDOSCOPY;  Service: Cardiovascular;  Laterality: N/A;   Defibrillator implantation     Medtronic Protecta 314 DRG ICD implanted by [name] for primary prevention   PPM GENERATOR CHANGEOUT N/A 11/29/2017   EVERA MRI XT DR ICD implanted by [name] for primary prevention   TEE WITHOUT CARDIOVERSION N/A 08/20/2017   Procedure: TRANSESOPHAGEAL ECHOCARDIOGRAM (TEE);  Surgeon: Quintella Reichert, MD;  Location: [facility] ENDOSCOPY;  Service: Cardiovascular;  Laterality: N/A;   TEE WITHOUT CARDIOVERSION N/A 05/08/2021   Procedure: TRANSESOPHAGEAL ECHOCARDIOGRAM (TEE);  Surgeon: Parke Poisson, MD;  Location: [facility] ENDOSCOPY;  Service: Cardiovascular;  Laterality: N/A;    Current Outpatient Medications  Medication Sig Dispense Refill   acetaminophen (TYLENOL) 500 MG tablet Take 500-1,000 mg by mouth every 6 (six) hours as needed for mild pain or headache.     amiodarone (PACERONE) 200 MG tablet Take 1 tablet (200 mg total) by mouth 2 (two) times daily. 60 tablet 0   apixaban (ELIQUIS) 5 MG TABS tablet TAKE 1 TABLET BY MOUTH 2 (TWO) TIMES DAILY. PLEASE KEEP LAB APPT FOR FURTHER REFILLS. THANKS (Patient taking differently: Take 5 mg by mouth 2  (two) times daily.) 60 tablet 0   furosemide (LASIX) 40 MG tablet Take 1 tablet (40 mg total) by mouth daily. 30 tablet 0   hydrOXYzine (VISTARIL) 25 MG capsule Take 1-3 capsules (25-75 mg total) by mouth every 8 (eight) hours as needed for anxiety. 90 capsule 0   metoprolol tartrate (LOPRESSOR) 50 MG tablet Take 50 mg by mouth 2 (two) times daily with a meal.     PARoxetine (PAXIL) 20 MG tablet Take 1 tablet (20 mg total) by mouth daily. 30 tablet 2   No current facility-administered medications for this visit.    No Known Allergies  Physical Exam: BP 102/70   Pulse 60   Ht 5\' 7"  (1.702 m)   Wt 225 lb (102.1 kg)   SpO2 96%  BMI 35.24 kg/m  Well developed and Morbidly obese in no acute distress HENT normal Neck supple with JVP-flat Lungs Clear Regular rate and rhythm, no  gallop 2/6 murmur holosystolic m at LLSB  Abd-soft with active BS No Clubbing cyanosis  edema Skin-warm and dry A & Oriented  Grossly normal sensory and motor function  EKG: Atrial paced at 60 Intervals 28[birth year]  Assessment and  Plan HCM  ICD Medtronic The patient's device was interrogated.  The information was reviewed. No changes were made in the programming.     Ventricular tachycardia-nonsustained  Obesity  Renal insufficiency grade 3 Estimated Creatinine Clearance: 52.7 mL/min (A) (by C-G formula based on SCr of 1.95 mg/dL (H)).  Sinus bradycardia  High Risk Medication Surveillance amiodarone  Atrial fibrillation with a rapid ventricular response  Mitral regurgitation  Congestive heart failure  chronic/diastolic  Holding sinus rhythm on amiodarone.  Tolerated surprisingly well 1 to 200 mg twice daily dose.  We will continue this for 3 weeks and then decrease it to 200 mg a day.  Surveillance laboratories were normal at the end of May.  Sinus bradycardia worsening with concomitant amiodarone and metoprolol.  We will decrease his metoprolol to 25 twice daily.  Mitral regurgitation status  is not so clear to me.  He transthoracic echo was done while he was in sinus rhythm, it was the family's impression that Dr. Janice Coffin thought it was done when he was in atrial fibrillation.   Dr. Johney Frame last note was that he would not be a candidate for endovascular ablation.  Discussed with the family value of seeing Dr. Regino Schultze at [facility] for pain picture recommendations as a relates to his MR, A. fib and heart failure.  He would like to pursue that.  Renal function is stable most notable that his creatinine was in the 1.4 range in early May.  He has been followed by nephrology.   Not on any obvious nephrotoxins except for his diuretics.  We will decrease his furosemide to every other day.  He can increase it as his weight increases above his dry weight at 220   I, Berta Minor, have reviewed all documentation for this visit. The documentation on 05/19/21 for the exam, diagnosis, procedures, and orders are all accurate and complete.  I, Sherryl Manges, MD, have reviewed all documentation for this visit. The documentation on 05/19/21 for the exam, diagnosis, procedures, and orders are all accurate and complete.

## 2021-05-21 ENCOUNTER — Ambulatory Visit (INDEPENDENT_AMBULATORY_CARE_PROVIDER_SITE_OTHER): Payer: Managed Care, Other (non HMO)

## 2021-05-21 DIAGNOSIS — I422 Other hypertrophic cardiomyopathy: Secondary | ICD-10-CM

## 2021-05-21 LAB — CUP PACEART REMOTE DEVICE CHECK
Battery Remaining Longevity: 73 mo
Battery Voltage: 2.89 V
Brady Statistic AP VP Percent: 26.57 %
Brady Statistic AP VS Percent: 67.17 %
Brady Statistic AS VP Percent: 0 %
Brady Statistic AS VS Percent: 6.27 %
Brady Statistic RA Percent Paced: 93.37 %
Brady Statistic RV Percent Paced: 26.59 %
Date Time Interrogation Session: 20220622022704
HighPow Impedance: 57 Ohm
HighPow Impedance: 78 Ohm
Implantable Lead Implant Date: 20110921
Implantable Lead Implant Date: 20110921
Implantable Lead Location: 753859
Implantable Lead Location: 753860
Implantable Lead Model: 5076
Implantable Lead Model: 6947
Implantable Pulse Generator Implant Date: 20181231
Lead Channel Impedance Value: 304 Ohm
Lead Channel Impedance Value: 361 Ohm
Lead Channel Impedance Value: 475 Ohm
Lead Channel Pacing Threshold Amplitude: 0.625 V
Lead Channel Pacing Threshold Amplitude: 0.625 V
Lead Channel Pacing Threshold Pulse Width: 0.4 ms
Lead Channel Pacing Threshold Pulse Width: 0.4 ms
Lead Channel Sensing Intrinsic Amplitude: 2.875 mV
Lead Channel Sensing Intrinsic Amplitude: 2.875 mV
Lead Channel Sensing Intrinsic Amplitude: 4.5 mV
Lead Channel Sensing Intrinsic Amplitude: 4.5 mV
Lead Channel Setting Pacing Amplitude: 2 V
Lead Channel Setting Pacing Amplitude: 2.5 V
Lead Channel Setting Pacing Pulse Width: 0.4 ms
Lead Channel Setting Sensing Sensitivity: 0.3 mV

## 2021-05-28 ENCOUNTER — Encounter: Payer: Managed Care, Other (non HMO) | Admitting: Internal Medicine

## 2021-06-09 ENCOUNTER — Emergency Department (HOSPITAL_COMMUNITY)
Admission: EM | Admit: 2021-06-09 | Discharge: 2021-06-30 | Disposition: E | Payer: Managed Care, Other (non HMO) | Attending: Emergency Medicine | Admitting: Emergency Medicine

## 2021-06-09 DIAGNOSIS — R0602 Shortness of breath: Secondary | ICD-10-CM | POA: Diagnosis present

## 2021-06-09 DIAGNOSIS — Z8679 Personal history of other diseases of the circulatory system: Secondary | ICD-10-CM

## 2021-06-09 DIAGNOSIS — Z79899 Other long term (current) drug therapy: Secondary | ICD-10-CM | POA: Insufficient documentation

## 2021-06-09 DIAGNOSIS — I469 Cardiac arrest, cause unspecified: Secondary | ICD-10-CM | POA: Diagnosis not present

## 2021-06-09 DIAGNOSIS — I5021 Acute systolic (congestive) heart failure: Secondary | ICD-10-CM | POA: Insufficient documentation

## 2021-06-09 DIAGNOSIS — J810 Acute pulmonary edema: Secondary | ICD-10-CM | POA: Diagnosis not present

## 2021-06-09 DIAGNOSIS — I422 Other hypertrophic cardiomyopathy: Secondary | ICD-10-CM | POA: Diagnosis not present

## 2021-06-09 DIAGNOSIS — R092 Respiratory arrest: Secondary | ICD-10-CM

## 2021-06-09 DIAGNOSIS — I480 Paroxysmal atrial fibrillation: Secondary | ICD-10-CM | POA: Diagnosis not present

## 2021-06-09 DIAGNOSIS — Z7901 Long term (current) use of anticoagulants: Secondary | ICD-10-CM | POA: Insufficient documentation

## 2021-06-09 MED ORDER — EPINEPHRINE 1 MG/10ML IJ SOSY
PREFILLED_SYRINGE | INTRAMUSCULAR | Status: AC | PRN
  Administered 2021-06-09 – 2021-06-10 (×15): 1 mg via INTRAVENOUS

## 2021-06-09 MED ORDER — SODIUM BICARBONATE 8.4 % IV SOLN
INTRAVENOUS | Status: AC | PRN
  Administered 2021-06-09 – 2021-06-10 (×6): 50 meq via INTRAVENOUS

## 2021-06-10 LAB — CBC WITH DIFFERENTIAL/PLATELET
Abs Immature Granulocytes: 1.31 10*3/uL — ABNORMAL HIGH (ref 0.00–0.07)
Basophils Absolute: 0.1 10*3/uL (ref 0.0–0.1)
Basophils Relative: 1 %
Eosinophils Absolute: 0.2 10*3/uL (ref 0.0–0.5)
Eosinophils Relative: 1 %
HCT: 43.5 % (ref 39.0–52.0)
Hemoglobin: 13.5 g/dL (ref 13.0–17.0)
Immature Granulocytes: 9 %
Lymphocytes Relative: 41 %
Lymphs Abs: 6.3 10*3/uL — ABNORMAL HIGH (ref 0.7–4.0)
MCH: 32.4 pg (ref 26.0–34.0)
MCHC: 31 g/dL (ref 30.0–36.0)
MCV: 104.3 fL — ABNORMAL HIGH (ref 80.0–100.0)
Monocytes Absolute: 0.3 10*3/uL (ref 0.1–1.0)
Monocytes Relative: 2 %
Neutro Abs: 7 10*3/uL (ref 1.7–7.7)
Neutrophils Relative %: 46 %
Platelets: 268 10*3/uL (ref 150–400)
RBC: 4.17 MIL/uL — ABNORMAL LOW (ref 4.22–5.81)
RDW: 13.3 % (ref 11.5–15.5)
WBC: 15.1 10*3/uL — ABNORMAL HIGH (ref 4.0–10.5)
nRBC: 0.4 % — ABNORMAL HIGH (ref 0.0–0.2)

## 2021-06-10 LAB — I-STAT CHEM 8, ED
BUN: 24 mg/dL — ABNORMAL HIGH (ref 6–20)
Calcium, Ion: 1.07 mmol/L — ABNORMAL LOW (ref 1.15–1.40)
Chloride: 105 mmol/L (ref 98–111)
Creatinine, Ser: 2.2 mg/dL — ABNORMAL HIGH (ref 0.61–1.24)
Glucose, Bld: 242 mg/dL — ABNORMAL HIGH (ref 70–99)
HCT: 48 % (ref 39.0–52.0)
Hemoglobin: 16.3 g/dL (ref 13.0–17.0)
Potassium: 3.1 mmol/L — ABNORMAL LOW (ref 3.5–5.1)
Sodium: 143 mmol/L (ref 135–145)
TCO2: 20 mmol/L — ABNORMAL LOW (ref 22–32)

## 2021-06-10 LAB — COMPREHENSIVE METABOLIC PANEL
ALT: <5 U/L (ref 0–44)
AST: <5 U/L — ABNORMAL LOW (ref 15–41)
Albumin: 2.2 g/dL — ABNORMAL LOW (ref 3.5–5.0)
Alkaline Phosphatase: 89 U/L (ref 38–126)
Anion gap: 35 — ABNORMAL HIGH (ref 5–15)
BUN: 20 mg/dL (ref 6–20)
CO2: 11 mmol/L — ABNORMAL LOW (ref 22–32)
Calcium: 7.7 mg/dL — ABNORMAL LOW (ref 8.9–10.3)
Chloride: 97 mmol/L — ABNORMAL LOW (ref 98–111)
Creatinine, Ser: 2.95 mg/dL — ABNORMAL HIGH (ref 0.61–1.24)
GFR, Estimated: 25 mL/min — ABNORMAL LOW (ref >60)
Glucose, Bld: 525 mg/dL (ref 70–99)
Potassium: 3.6 mmol/L (ref 3.5–5.1)
Sodium: 143 mmol/L (ref 135–145)
Total Bilirubin: 0.7 mg/dL (ref 0.3–1.2)
Total Protein: 4.2 g/dL — ABNORMAL LOW (ref 6.5–8.1)

## 2021-06-10 LAB — I-STAT VENOUS BLOOD GAS, ED
Acid-base deficit: 17 mmol/L — ABNORMAL HIGH (ref 0.0–2.0)
Bicarbonate: 16.7 mmol/L — ABNORMAL LOW (ref 20.0–28.0)
Calcium, Ion: 1.06 mmol/L — ABNORMAL LOW (ref 1.15–1.40)
HCT: 47 % (ref 39.0–52.0)
Hemoglobin: 16 g/dL (ref 13.0–17.0)
O2 Saturation: 25 %
Potassium: 3.2 mmol/L — ABNORMAL LOW (ref 3.5–5.1)
Sodium: 143 mmol/L (ref 135–145)
TCO2: 19 mmol/L — ABNORMAL LOW (ref 22–32)
pCO2, Ven: 74.1 mmHg (ref 44.0–60.0)
pH, Ven: 6.961 — CL (ref 7.250–7.430)
pO2, Ven: 27 mmHg — CL (ref 32.0–45.0)

## 2021-06-10 LAB — SAMPLE TO BLOOD BANK

## 2021-06-10 LAB — LACTIC ACID, PLASMA: Lactic Acid, Venous: >11 mmol/L (ref 0.5–1.9)

## 2021-06-10 LAB — PROTIME-INR
INR: 1.7 — ABNORMAL HIGH (ref 0.8–1.2)
Prothrombin Time: 19.6 seconds — ABNORMAL HIGH (ref 11.4–15.2)

## 2021-06-10 LAB — TROPONIN I (HIGH SENSITIVITY): Troponin I (High Sensitivity): 1751 ng/L (ref <18)

## 2021-06-10 MED ORDER — MORPHINE SULFATE (PF) 4 MG/ML IV SOLN: 4.0000 mg | Freq: Once | INTRAVENOUS | Status: DC

## 2021-06-10 MED ORDER — AMIODARONE HCL 150 MG/3ML IV SOLN
INTRAVENOUS | Status: AC | PRN
  Administered 2021-06-10: 300 mg via INTRAVENOUS
  Administered 2021-06-10: 150 mg via INTRAVENOUS

## 2021-06-10 MED ORDER — PHENYLEPHRINE 40 MCG/ML (10ML) SYRINGE FOR IV PUSH (FOR BLOOD PRESSURE SUPPORT)
PREFILLED_SYRINGE | INTRAVENOUS | Status: AC
  Filled 2021-06-10: qty 10

## 2021-06-10 MED ORDER — PHENYLEPHRINE 40 MCG/ML (10ML) SYRINGE FOR IV PUSH (FOR BLOOD PRESSURE SUPPORT)
PREFILLED_SYRINGE | INTRAVENOUS | Status: AC | PRN
  Administered 2021-06-10 (×2): 200 ug via INTRAVENOUS

## 2021-06-10 MED ORDER — VASOPRESSIN 20 UNITS/100 ML INFUSION FOR SHOCK
0.0000 [IU]/min | INTRAVENOUS | Status: AC
Start: 2021-06-10 — End: 2021-06-10
  Administered 2021-06-10: 0.03 [IU]/min via INTRAVENOUS
  Filled 2021-06-10: qty 100

## 2021-06-10 MED ORDER — NOREPINEPHRINE 4 MG/250ML-% IV SOLN
0.0000 ug/min | INTRAVENOUS | Status: DC
  Administered 2021-06-10: 20 ug/min via INTRAVENOUS

## 2021-06-10 MED ORDER — SODIUM CHLORIDE 0.9 % IV SOLN
INTRAVENOUS | Status: AC | PRN
  Administered 2021-06-10: 1000 mL via INTRAVENOUS

## 2021-06-16 ENCOUNTER — Ambulatory Visit: Payer: Managed Care, Other (non HMO) | Admitting: Family Medicine

## 2021-06-17 ENCOUNTER — Telehealth: Payer: Self-pay | Admitting: Family Medicine

## 2021-06-17 MED FILL — Epinephrine Soln Prefilled Syringe 1 MG/10ML (0.1 MG/ML): INTRAMUSCULAR | Qty: 60 | Status: AC

## 2021-06-17 NOTE — Telephone Encounter (Signed)
Completed.

## 2021-06-17 NOTE — Telephone Encounter (Signed)
Caller: Rob ([employer] (medical)) Call back [phone]  Death certificate is ready for signature in the system

## 2021-06-17 NOTE — Telephone Encounter (Signed)
Cremation service informed completed.

## 2021-06-30 DIAGNOSIS — 419620001 Death: Secondary | SNOMED CT | POA: Diagnosis not present

## 2021-06-30 NOTE — ED Notes (Signed)
Pt has carotid pulse via doppler, compressions stopped

## 2021-06-30 NOTE — ED Notes (Signed)
Family at bedside, decision made to stop all drips. Pumps turned off, monitor changed to comfort care

## 2021-06-30 NOTE — ED Notes (Signed)
Pt transported to the morgue  

## 2021-06-30 NOTE — Consult Note (Signed)
Responded to page that wife of cardia arrest pt was expected. Met her at security, accompanied her thru waiting for/hearing initial med report, then keeping watch in Tra A w/ all-out efforts to save pt.  She and husband had no particular faith, though she said her mother was praying for them. I told her I was too. She was grateful--and for everyone's efforts to save pt. After pt passed, nurse gave wife pt placement info.  Rev. Eloise Levels Chaplain

## 2021-06-30 NOTE — Progress Notes (Signed)
Pt arrived intubated and coding.. CPR was performed and multiple rounds of epi given. ROSC pt put on ventilator PRVC . PT coded again,CPR administrated. ROSC pt placed on ventilator again. CCM Dr. Riley Churches vent mode to [facility]. Per. Verbal order by Dr. Wilkie Aye removed pt from ventilator at 0130.

## 2021-06-30 NOTE — ED Provider Notes (Signed)
[facility]   CSN: [id number] Arrival date & time: 05-19-21  2335     History Chief Complaint  Patient presents with   Cardiac Arrest    Charles DialsMarshall Wyatt is a 50 y.o. male.  HPI     This a 50 year old male with extensive cardiac history including persistent atrial fibrillation on anticoagulation and hypertrophic cardiomyopathy who presents by EMS with respiratory arrest.  Per EMS he was talking upon their arrival.  Complaining of shortness of breath.  Noted to be likely in pulmonary edema.  He was started on CPAP.  He decompensated in route.  He became unresponsive and required intubation.  He was significantly hypoxic per EMS.  3 minutes prior to arrival they report that he went into cardiac arrest.  They started compressions.  They were not able to provide any additional ACLS measures.   Please see full code documentation.  Level 5 caveat  Past Medical History:  Diagnosis Date   Biatrial enlargement    HCM    06 19/2011 ?[facility] ?Tee to R/o subaortic membrane    Implantable cardiac defibrillator Medtronic    Inappropriate shocks from ICD --AFib    Persistent atrial fibrillation (HCC)    Inappro shock   Snoring     Patient Active Problem List   Diagnosis Date Noted   Thrombocytosis 05/06/2021   Acute systolic CHF (congestive heart failure) (HCC) 05/06/2021   Elevated troponin 05/06/2021   Atrial fibrillation with rapid ventricular response (HCC) 04/11/2021   Hypokalemia 04/11/2021   Acute kidney injury superimposed on CKD (HCC) 04/11/2021   Atrial fibrillation with RVR (HCC) 04/11/2021   NSVT (nonsustained ventricular tachycardia) (HCC) 02/02/2021   Atrial fibrillation (HCC)    Shortness of breath    PAF (paroxysmal atrial fibrillation) (HCC) 05/19/2013   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   OBESITY 05/20/2010   Hypertrophic cardiomyopathy (HCC) 05/20/2010   ANEMIA 05/19/2010    MURMUR 05/19/2010    Past Surgical History:  Procedure Laterality Date   BUBBLE STUDY  05/08/2021   Procedure: BUBBLE STUDY;  Surgeon: Parke PoissonAcharya, Gayatri A, MD;  [facility];  Service: Cardiovascular;;   CARDIOVERSION N/A 08/20/2017   Procedure: CARDIOVERSION;  Surgeon: Quintella Reicherturner, Traci R, MD;  [facility];  Service: Cardiovascular;  Laterality: N/A;   CARDIOVERSION N/A 08/24/2017   Procedure: CARDIOVERSION;  Surgeon: Jake BatheSkains, Mark C, MD;  [facility];  Service: Cardiovascular;  Laterality: N/A;   CARDIOVERSION N/A 05/08/2021   Procedure: CARDIOVERSION;  Surgeon: Parke PoissonAcharya, Gayatri A, MD;  [facility];  Service: Cardiovascular;  Laterality: N/A;   Defibrillator implantation     Medtronic Protecta 314 DRG ICD implanted by Dr Graciela HusbandsKlein for primary prevention   PPM GENERATOR CHANGEOUT N/A 11/29/2017   EVERA MRI XT DR ICD implanted by Dr Graciela HusbandsKlein for primary prevention   TEE WITHOUT CARDIOVERSION N/A 08/20/2017   Procedure: TRANSESOPHAGEAL ECHOCARDIOGRAM (TEE);  Surgeon: Quintella Reicherturner, Traci R, MD;  [facility];  Service: Cardiovascular;  Laterality: N/A;   TEE WITHOUT CARDIOVERSION N/A 05/08/2021   Procedure: TRANSESOPHAGEAL ECHOCARDIOGRAM (TEE);  Surgeon: Parke PoissonAcharya, Gayatri A, MD;  [facility];  Service: Cardiovascular;  Laterality: N/A;       Family History  Problem Relation Age of Onset   Cardiomyopathy Other        hypertensive obstructive cardiomyopathy    Social History   Tobacco Use   Smoking status: Never   Smokeless tobacco: Never  Vaping Use   Vaping Use: Never  used  Substance Use Topics   Alcohol use: Yes    Comment: occasional    Drug use: No    Home Medications Prior to Admission medications   Medication Sig Start Date End Date Taking? Authorizing Provider  acetaminophen (TYLENOL) 500 MG tablet Take 500-1,000 mg by mouth every 6 (six) hours as needed for mild pain or headache.    [provider]  amiodarone (PACERONE)  200 MG tablet Begin taking 1 tablet (200mg ) by mouth daily on 06/08/2021 05/19/21   05/21/21, MD  apixaban (ELIQUIS) 5 MG TABS tablet TAKE 1 TABLET BY MOUTH 2 (TWO) TIMES DAILY. PLEASE KEEP LAB APPT FOR FURTHER REFILLS. THANKS Patient taking differently: Take 5 mg by mouth 2 (two) times daily. 12/05/20   02/02/21, MD  furosemide (LASIX) 40 MG tablet Take 1 tablet (40 mg total) by mouth daily. 05/10/21 2021/06/16  08/10/21, [place], DO  hydrOXYzine (VISTARIL) 25 MG capsule Take 1-3 capsules (25-75 mg total) by mouth every 8 (eight) hours as needed for anxiety. 05/05/21   07/05/21, DO  metoprolol tartrate (LOPRESSOR) 25 MG tablet Take 1 tablet (25 mg total) by mouth 2 (two) times daily. 05/19/21 08/17/21  08/19/21, MD  PARoxetine (PAXIL) 20 MG tablet Take 1 tablet (20 mg total) by mouth daily. 05/05/21   07/05/21, DO    Allergies    Patient has no known allergies.  Review of Systems   Review of Systems  Unable to perform ROS: Intubated   Physical Exam Updated Vital Signs BP 94/77   Pulse 60   Resp 19   SpO2 100%   Physical Exam Vitals and nursing note reviewed.  Constitutional:      Appearance: He is well-developed. He is obese.     Comments: Intubated, no purposeful movement  HENT:     Head: Normocephalic and atraumatic.     Nose: Nose normal.     Mouth/Throat:     Comments: Copious frothy, bloody sputum in mouth and ET tube Eyes:     Comments: Pupils 8 minimally responsive bilateral  Cardiovascular:     Comments: CPR in progress Pulmonary:     Comments: No spontaneous respirations, intubated, coarse breath sounds with bagging Abdominal:     General: There is distension.     Palpations: Abdomen is soft.     Tenderness: There is no abdominal tenderness.  Musculoskeletal:     Cervical back: Neck supple.     Right lower leg: No edema.     Left lower leg: No edema.  Lymphadenopathy:     Cervical: No cervical adenopathy.  Skin:     Coloration: Skin is pale.     Comments: Mottled  Neurological:     Comments: GCS 3  Psychiatric:     Comments: Unable to assess    ED Results / Procedures / Treatments   Labs (all labs ordered are listed, but only abnormal results are displayed) Labs Reviewed  CBC WITH DIFFERENTIAL/PLATELET - Abnormal; Notable for the following components:      Result Value   WBC 15.1 (*)    RBC 4.17 (*)    MCV 104.3 (*)    nRBC 0.4 (*)    Lymphs Abs 6.3 (*)    Abs Immature Granulocytes 1.31 (*)    All other components within normal limits  PROTIME-INR - Abnormal; Notable for the following components:   Prothrombin Time 19.6 (*)    INR 1.7 (*)  All other components within normal limits  I-STAT CHEM 8, ED - Abnormal; Notable for the following components:   Potassium 3.1 (*)    BUN 24 (*)    Creatinine, Ser 2.20 (*)    Glucose, Bld 242 (*)    Calcium, Ion 1.07 (*)    TCO2 20 (*)    All other components within normal limits  I-STAT VENOUS BLOOD GAS, ED - Abnormal; Notable for the following components:   pH, Ven 6.961 (*)    pCO2, Ven 74.1 (*)    pO2, Ven 27.0 (*)    Bicarbonate 16.7 (*)    TCO2 19 (*)    Acid-base deficit 17.0 (*)    Potassium 3.2 (*)    Calcium, Ion 1.06 (*)    All other components within normal limits  RESP PANEL BY RT-PCR (FLU A&B, COVID) ARPGX2  COMPREHENSIVE METABOLIC PANEL  LACTIC ACID, PLASMA  LACTIC ACID, PLASMA  SAMPLE TO BLOOD BANK  TROPONIN I (HIGH SENSITIVITY)    EKG EKG Interpretation  Date/Time:  Tuesday June 10 2021 00:45:43 EDT Ventricular Rate:  76 PR Interval:    QRS Duration: 225 QT Interval:  471 QTC Calculation: 530 R Axis:   137 Text Interpretation: Atrial fibrillation Nonspecific intraventricular conduction delay Repol abnrm, severe global ischemia (LM/MVD) Baseline wander in lead(s) V3 Confirmed by Ross Marcus ([id number]) on 06/06/2021 2:08:03 AM  Radiology No results found.  Procedures .Critical Care  Date/Time: 06/13/2021  2:14 AM Performed by: Shon Baton, MD Authorized by: Shon Baton, MD   Critical care provider statement:    Critical care time (minutes):  100   Critical care was necessary to treat or prevent imminent or life-threatening deterioration of the following conditions:  Cardiac failure, respiratory failure and shock   Critical care was time spent personally by me on the following activities:  Discussions with consultants, evaluation of patient's response to treatment, examination of patient, ordering and performing treatments and interventions, ordering and review of laboratory studies, ordering and review of radiographic studies, pulse oximetry, re-evaluation of patient's condition, obtaining history from patient or surrogate and review of old charts ARTERIAL LINE  Date/Time: 05/31/2021 2:14 AM Performed by: Shon Baton, MD Authorized by: Shon Baton, MD   Consent:    Consent obtained:  Emergent situation Indications:    Indications: hemodynamic monitoring   Pre-procedure details:    Skin preparation:  Chlorhexidine Sedation:    Sedation type:  None Anesthesia:    Anesthesia method:  None Procedure details:    Location:  R femoral   Needle gauge:  18 G   Placement technique:  Seldinger and ultrasound guided   Number of attempts:  2   Transducer: waveform confirmed   Post-procedure details:    Post-procedure:  Sterile dressing applied   CMS:  Unchanged   Procedure completion:  Tolerated CPR  Date/Time: 06/13/2021 2:16 AM Performed by: Shon Baton, MD Authorized by: Shon Baton, MD  CPR Procedure Details:      Amount of time prior to administration of ACLS/BLS (minutes):  60   ACLS/BLS initiated by EMS: Yes     CPR/ACLS performed in the ED: Yes     Duration of CPR (minutes):  60   Outcome: ROSC obtained    CPR performed via ACLS guidelines under my direct supervision.  See RN documentation for details including defibrillator use,  medications, doses and timing. Comments:     ROSC of initially obtained Care withdrawn after discussion with  wife   Medications Ordered in ED Medications  norepinephrine (LEVOPHED) 4mg  in premix infusion (50 mcg/min Intravenous Rate/Dose Change Jun 12, 2021 0035)  phenylephrine 0.4-0.9 MG/10ML-% injection (has no administration in time range)  morphine 4 MG/ML injection 4 mg (has no administration in time range)  EPINEPHrine (ADRENALIN) 1 MG/10ML injection (1 mg Intravenous Given 2021/06/12 0041)  sodium bicarbonate injection (50 mEq Intravenous Given 2021/06/12 0027)  amiodarone (CORDARONE) injection (150 mg Intravenous Given 06-12-2021 0034)  vasopressin (PITRESSIN) 20 Units in sodium chloride 0.9 % 100 mL infusion-*FOR SHOCK* (0.03 Units/min Intravenous New Bag/Given 2021-06-12 0046)  0.9 %  sodium chloride infusion (1,000 mLs Intravenous New Bag/Given Jun 12, 2021 0017)  PHENYLephrine 40 mcg/ml in normal saline Adult IV Push Syringe (For Blood Pressure Support) (200 mcg Intravenous Given 06-12-21 0112)    ED Course  I have reviewed the triage vital signs and the nursing notes.  Pertinent labs & imaging results that were available during my care of the patient were reviewed by me and considered in my medical decision making (see chart for details).  Clinical Course as of 06-12-2021 0214  Tue 06-12-21  0121 Patient's wife at the bedside.  Critical care and cardiology also at the bedside.  Patient without palpable central pulses but organized cardiac activity on the monitor.  Wife was advised of patient's poor prognosis.  Given prolonged CPR and refractory hypotension.  Decision made to withdraw life-saving measures and make the patient comfort care. [CH]  0128 Time of death 0125 [CH]    Clinical Course User Index [CH] Damyon Mullane, [id number], MD   MDM Rules/Calculators/A&P                           Upon arrival to the trauma bay, patient obtunded and CPR in progress.  Patient was given 1 mg of  epinephrine.  Initial rhythm PEA.  ET tube was confirmed with both end-tidal and direct visualization.  He did have diminished breath sounds on the left.  ACLS protocol continued.  Patient transition from PEA to V. fib/V. tach.  He received multiple doses of epinephrine, bicarb, and ultimately received amiodarone load given transition to V. tach and V. fib.  He also received multiple shocks.  His ET tube was pulled back 1 cm and reconfirmed with good end-tidal.  He had extensive frothy sputum and was suctioned multiple times.  After approximately 30 minutes of CPR, patient did have ROSC with palpable central pulses.  He was started on Levophed.  However, within approximately 5 minutes he arrested again.  This was primary V. tach arrest.  He received several shocks and additional epinephrine.  Ultimately he transitioned to a perfusing rhythm although his central pulses were not palpable.  I confirmed coordinated cardiac activity on echo and pulses by ultrasound.  Placed a arterial line given inability to palpate central pulses.  Patient with significantly low blood pressure.  Wife was updated during this time.  She was advised of the patient's dire prognosis.  Initially she wanted Charles Wyatt to continue resuscitative efforts.  I invited her into the room.  Ultimately cardiology and critical care were both consulted and came to the bedside.  Patient was on Levophed, given phenylephrine and vasopressin with minimal response of his blood pressure.  It was decided in consultation with cardiology, critical care, and the wife that patient should transition to comfort measures.    Patient presented in cardiac arrest.  Suspect primary respiratory arrest initially given PEA  but converted to V. fib/V. tach.  Was unaware of his history of hypertrophic cardiomyopathy initially.  I did involve critical care and cardiology who came to the bedside to assist.  I appreciate their assistance.  Patient received extensive ACLS; however, given  persistent shock and poor neurologic status, patient was transitioned to comfort care and time of death was called at 0125.  Wife was at the bedside at time of death.   Final Clinical Impression(s) / ED Diagnoses Final diagnoses:  Respiratory arrest (HCC)  Flash pulmonary edema (HCC)  Cardiac arrest (HCC)  History of hypertrophic cardiomyopathy    Rx / DC Orders ED Discharge Orders     None        Azazel Franze, Charles Masker, MD 06/23/[id number]

## 2021-06-30 NOTE — ED Notes (Signed)
Pt lost doppler pulse, CPR started

## 2021-06-30 NOTE — ED Notes (Signed)
Pt has faint femoral pulse, compressions stopped

## 2021-06-30 NOTE — ED Notes (Signed)
R femoral arterial line placed by Horton MD

## 2021-06-30 NOTE — ED Notes (Signed)
Cards paged per verbal order by Dr. Cristy Folks

## 2021-06-30 NOTE — ED Notes (Addendum)
Arterial line set up at bedside per Horton MD

## 2021-06-30 NOTE — ED Notes (Addendum)
Cardiology and critical care at bedside

## 2021-06-30 NOTE — ED Notes (Signed)
Pt lost pulses, CPR started. 

## 2021-06-30 NOTE — ED Triage Notes (Signed)
Pt arrives via [facility] as CPR in progress. Initial call for shortness of breath, EMS placed pt on CPAP, O2 sat at 70%. Pt became unresponsive, EMS intubated 7.0 tube, assisting ventilations via BVM. Pt lost pulse at 2330, CPR initiated prior to arrival.

## 2021-06-30 NOTE — ED Notes (Signed)
Patient placement called °

## 2021-06-30 NOTE — Code Documentation (Addendum)
Cardiac ultrasound at bedside, organized rhythm seen per Horton MD, no palpated pulse.

## 2021-06-30 NOTE — ED Notes (Signed)
Time of death called by Horton MD at [phone]

## 2021-06-30 NOTE — ED Notes (Signed)
Critical Care paged per verbal order by Dr. Cristy Folks

## 2021-06-30 NOTE — Code Documentation (Signed)
Family at beside  

## 2021-06-30 NOTE — Progress Notes (Signed)
Remote ICD transmission.   

## 2021-06-30 NOTE — ED Notes (Signed)
Femoral pulse detected via ultrasound by Horton MD

## 2021-06-30 DEATH — deceased

## 2021-09-01 ENCOUNTER — Encounter: Payer: Managed Care, Other (non HMO) | Admitting: Internal Medicine
